# Patient Record
Sex: Female | Born: 1965
Health system: Southern US, Community
[De-identification: ages and names within clinical notes are randomized; demographics above are authoritative.]

## PROBLEM LIST (undated history)

## (undated) DIAGNOSIS — D72819 Decreased white blood cell count, unspecified: Secondary | ICD-10-CM

## (undated) DIAGNOSIS — B019 Varicella without complication: Secondary | ICD-10-CM

## (undated) DIAGNOSIS — N189 Chronic kidney disease, unspecified: Secondary | ICD-10-CM

## (undated) DIAGNOSIS — N200 Calculus of kidney: Secondary | ICD-10-CM

## (undated) DIAGNOSIS — G43909 Migraine, unspecified, not intractable, without status migrainosus: Secondary | ICD-10-CM

## (undated) DIAGNOSIS — Z114 Encounter for screening for human immunodeficiency virus [HIV]: Secondary | ICD-10-CM

## (undated) DIAGNOSIS — C801 Malignant (primary) neoplasm, unspecified: Secondary | ICD-10-CM

## (undated) DIAGNOSIS — I493 Ventricular premature depolarization: Principal | ICD-10-CM

## (undated) DIAGNOSIS — C8590 Non-Hodgkin lymphoma, unspecified, unspecified site: Secondary | ICD-10-CM

## (undated) HISTORY — DX: Encounter for screening for human immunodeficiency virus (HIV): Z11.4

## (undated) HISTORY — DX: Chronic kidney disease, unspecified: N18.9

## (undated) HISTORY — DX: Malignant (primary) neoplasm, unspecified: C80.1

## (undated) HISTORY — DX: Migraine, unspecified, not intractable, without status migrainosus: G43.909

## (undated) HISTORY — DX: Non-Hodgkin lymphoma, unspecified, unspecified site: C85.90

## (undated) HISTORY — DX: Calculus of kidney: N20.0

## (undated) HISTORY — DX: Decreased white blood cell count, unspecified: D72.819

## (undated) HISTORY — PX: EYE SURGERY: SHX253

## (undated) HISTORY — PX: COLONOSCOPY: SHX174

## (undated) HISTORY — DX: Varicella without complication: B01.9

## (undated) HISTORY — DX: Ventricular premature depolarization: I49.3

## (undated) HISTORY — PX: SPINE SURGERY: SHX786

## (undated) HISTORY — PX: WISDOM TOOTH EXTRACTION: SHX21

---

## 1999-01-31 HISTORY — PX: CHOLECYSTECTOMY: SHX55

## 1999-01-31 HISTORY — PX: LAPAROSCOPIC CHOLECYSTECTOMY: SUR755

## 2004-01-31 HISTORY — PX: REFRACTIVE SURGERY: SHX103

## 2004-06-30 ENCOUNTER — Other Ambulatory Visit: Admission: RE | Admit: 2004-06-30 | Discharge: 2004-06-30 | Payer: Self-pay | Admitting: Obstetrics and Gynecology

## 2004-07-14 ENCOUNTER — Ambulatory Visit (HOSPITAL_COMMUNITY): Admission: RE | Admit: 2004-07-14 | Discharge: 2004-07-14 | Payer: Self-pay | Admitting: *Deleted

## 2005-07-04 ENCOUNTER — Other Ambulatory Visit: Admission: RE | Admit: 2005-07-04 | Discharge: 2005-07-04 | Payer: Self-pay | Admitting: Obstetrics & Gynecology

## 2006-02-07 ENCOUNTER — Ambulatory Visit (HOSPITAL_COMMUNITY): Admission: RE | Admit: 2006-02-07 | Discharge: 2006-02-07 | Payer: Self-pay | Admitting: Obstetrics and Gynecology

## 2006-10-24 ENCOUNTER — Other Ambulatory Visit: Admission: RE | Admit: 2006-10-24 | Discharge: 2006-10-24 | Payer: Self-pay | Admitting: Obstetrics and Gynecology

## 2007-03-04 ENCOUNTER — Ambulatory Visit (HOSPITAL_COMMUNITY): Admission: RE | Admit: 2007-03-04 | Discharge: 2007-03-04 | Payer: Self-pay | Admitting: Obstetrics and Gynecology

## 2007-05-01 DIAGNOSIS — Z87442 Personal history of urinary calculi: Secondary | ICD-10-CM

## 2007-05-01 HISTORY — DX: Personal history of urinary calculi: Z87.442

## 2007-10-25 ENCOUNTER — Other Ambulatory Visit: Admission: RE | Admit: 2007-10-25 | Discharge: 2007-10-25 | Payer: Self-pay | Admitting: Obstetrics and Gynecology

## 2007-11-16 ENCOUNTER — Emergency Department (HOSPITAL_COMMUNITY): Admission: EM | Admit: 2007-11-16 | Discharge: 2007-11-16 | Payer: Self-pay | Admitting: Emergency Medicine

## 2008-03-31 ENCOUNTER — Encounter: Admission: RE | Admit: 2008-03-31 | Discharge: 2008-03-31 | Payer: Self-pay | Admitting: Obstetrics and Gynecology

## 2008-05-15 ENCOUNTER — Ambulatory Visit: Payer: Self-pay | Admitting: Diagnostic Radiology

## 2008-05-15 ENCOUNTER — Emergency Department (HOSPITAL_BASED_OUTPATIENT_CLINIC_OR_DEPARTMENT_OTHER): Admission: EM | Admit: 2008-05-15 | Discharge: 2008-05-15 | Payer: Self-pay | Admitting: Emergency Medicine

## 2008-09-11 ENCOUNTER — Encounter: Admission: RE | Admit: 2008-09-11 | Discharge: 2008-09-11 | Payer: Self-pay | Admitting: Family Medicine

## 2009-04-01 ENCOUNTER — Encounter: Admission: RE | Admit: 2009-04-01 | Discharge: 2009-04-01 | Payer: Self-pay | Admitting: Pulmonary Disease

## 2009-04-26 ENCOUNTER — Encounter: Admission: RE | Admit: 2009-04-26 | Discharge: 2009-04-26 | Payer: Self-pay | Admitting: Unknown Physician Specialty

## 2009-10-14 ENCOUNTER — Encounter: Admission: RE | Admit: 2009-10-14 | Discharge: 2009-10-14 | Payer: Self-pay | Admitting: Pulmonary Disease

## 2009-12-16 ENCOUNTER — Ambulatory Visit (HOSPITAL_COMMUNITY): Admission: RE | Admit: 2009-12-16 | Discharge: 2009-12-16 | Payer: Self-pay | Admitting: Urology

## 2010-04-12 LAB — PREGNANCY, URINE: Preg Test, Ur: NEGATIVE

## 2010-04-18 ENCOUNTER — Other Ambulatory Visit: Payer: Self-pay | Admitting: Obstetrics and Gynecology

## 2010-04-18 DIAGNOSIS — Z1231 Encounter for screening mammogram for malignant neoplasm of breast: Secondary | ICD-10-CM

## 2010-05-04 ENCOUNTER — Telehealth: Payer: Self-pay | Admitting: *Deleted

## 2010-05-11 LAB — BASIC METABOLIC PANEL
BUN: 23 mg/dL (ref 6–23)
CO2: 26 mEq/L (ref 19–32)
Calcium: 9 mg/dL (ref 8.4–10.5)
Chloride: 103 mEq/L (ref 96–112)
Creatinine, Ser: 1 mg/dL (ref 0.4–1.2)
GFR calc Af Amer: >60 mL/min (ref >60)
GFR calc non Af Amer: >60 mL/min (ref >60)
Glucose, Bld: 120 mg/dL — ABNORMAL HIGH (ref 70–99)
Potassium: 3.9 mEq/L (ref 3.5–5.1)
Sodium: 140 mEq/L (ref 135–145)

## 2010-05-11 LAB — URINALYSIS, ROUTINE W REFLEX MICROSCOPIC
Bilirubin Urine: NEGATIVE
Glucose, UA: NEGATIVE mg/dL
Ketones, ur: NEGATIVE mg/dL
Nitrite: NEGATIVE
Protein, ur: 100 mg/dL — AB
Specific Gravity, Urine: 1.018 (ref 1.005–1.030)
Urobilinogen, UA: 0.2 mg/dL (ref 0.0–1.0)
pH: 6 (ref 5.0–8.0)

## 2010-05-11 LAB — CBC
HCT: 43.1 % (ref 36.0–46.0)
Hemoglobin: 14.4 g/dL (ref 12.0–15.0)
MCHC: 33.5 g/dL (ref 30.0–36.0)
MCV: 88.9 fL (ref 78.0–100.0)
Platelets: 163 10*3/uL (ref 150–400)
RBC: 4.85 MIL/uL (ref 3.87–5.11)
RDW: 12.4 % (ref 11.5–15.5)
WBC: 9.4 10*3/uL (ref 4.0–10.5)

## 2010-05-11 LAB — URINE MICROSCOPIC-ADD ON

## 2010-05-11 LAB — DIFFERENTIAL
Basophils Absolute: 0.1 10*3/uL (ref 0.0–0.1)
Basophils Relative: 1 % (ref 0–1)
Eosinophils Absolute: 0 10*3/uL (ref 0.0–0.7)
Eosinophils Relative: 0 % (ref 0–5)
Lymphocytes Relative: 3 % — ABNORMAL LOW (ref 12–46)
Lymphs Abs: 0.3 10*3/uL — ABNORMAL LOW (ref 0.7–4.0)
Monocytes Absolute: 0.4 10*3/uL (ref 0.1–1.0)
Monocytes Relative: 4 % (ref 3–12)
Neutro Abs: 8.6 10*3/uL — ABNORMAL HIGH (ref 1.7–7.7)
Neutrophils Relative %: 92 % — ABNORMAL HIGH (ref 43–77)

## 2010-05-11 LAB — PREGNANCY, URINE: Preg Test, Ur: NEGATIVE

## 2010-05-17 ENCOUNTER — Ambulatory Visit
Admission: RE | Admit: 2010-05-17 | Discharge: 2010-05-17 | Disposition: A | Discharge status: Home / Self Care | Payer: Self-pay | Source: Ambulatory Visit | Attending: Obstetrics and Gynecology | Admitting: Obstetrics and Gynecology

## 2010-05-17 DIAGNOSIS — Z1231 Encounter for screening mammogram for malignant neoplasm of breast: Secondary | ICD-10-CM

## 2010-09-20 ENCOUNTER — Other Ambulatory Visit: Payer: Self-pay | Admitting: Pulmonary Disease

## 2010-09-20 DIAGNOSIS — R918 Other nonspecific abnormal finding of lung field: Secondary | ICD-10-CM

## 2010-09-22 ENCOUNTER — Ambulatory Visit
Admission: RE | Admit: 2010-09-22 | Discharge: 2010-09-22 | Disposition: A | Discharge status: Home / Self Care | Payer: BC Managed Care – PPO | Source: Ambulatory Visit | Attending: Pulmonary Disease | Admitting: Pulmonary Disease

## 2010-09-22 DIAGNOSIS — R918 Other nonspecific abnormal finding of lung field: Secondary | ICD-10-CM

## 2010-11-01 LAB — POCT I-STAT, CHEM 8
BUN: 18
Calcium, Ion: 1.15
Chloride: 103
Creatinine, Ser: 1
Glucose, Bld: 90
HCT: 42
Hemoglobin: 14.3
Potassium: 3.7
Sodium: 141
TCO2: 29

## 2010-11-01 LAB — DIFFERENTIAL
Basophils Absolute: 0
Basophils Relative: 1
Eosinophils Absolute: 0.1
Eosinophils Relative: 2
Lymphocytes Relative: 23
Lymphs Abs: 0.8
Monocytes Absolute: 0.3
Monocytes Relative: 9
Neutro Abs: 2.2
Neutrophils Relative %: 66

## 2010-11-01 LAB — CBC
HCT: 41.3
Hemoglobin: 13.9
MCHC: 33.6
MCV: 89.3
Platelets: 177
RBC: 4.62
RDW: 13.8
WBC: 3.4 — ABNORMAL LOW

## 2011-04-18 ENCOUNTER — Other Ambulatory Visit: Payer: Self-pay | Admitting: Obstetrics and Gynecology

## 2011-04-18 DIAGNOSIS — Z139 Encounter for screening, unspecified: Secondary | ICD-10-CM

## 2011-05-23 ENCOUNTER — Ambulatory Visit
Admission: RE | Admit: 2011-05-23 | Discharge: 2011-05-23 | Disposition: A | Discharge status: Home / Self Care | Payer: BC Managed Care – PPO | Source: Ambulatory Visit | Attending: Obstetrics and Gynecology | Admitting: Obstetrics and Gynecology

## 2011-05-23 DIAGNOSIS — Z139 Encounter for screening, unspecified: Secondary | ICD-10-CM

## 2012-05-07 ENCOUNTER — Other Ambulatory Visit: Payer: Self-pay | Admitting: Obstetrics and Gynecology

## 2012-05-07 DIAGNOSIS — Z1231 Encounter for screening mammogram for malignant neoplasm of breast: Secondary | ICD-10-CM

## 2012-05-28 ENCOUNTER — Ambulatory Visit (INDEPENDENT_AMBULATORY_CARE_PROVIDER_SITE_OTHER): Payer: BC Managed Care – PPO

## 2012-05-28 DIAGNOSIS — Z1231 Encounter for screening mammogram for malignant neoplasm of breast: Secondary | ICD-10-CM

## 2012-08-14 ENCOUNTER — Encounter: Payer: Self-pay | Admitting: Nurse Practitioner

## 2012-08-14 ENCOUNTER — Ambulatory Visit (INDEPENDENT_AMBULATORY_CARE_PROVIDER_SITE_OTHER): Payer: BC Managed Care – PPO | Admitting: Nurse Practitioner

## 2012-08-14 VITALS — BP 106/64 | HR 62 | Resp 14 | Ht 67.0 in | Wt 140.0 lb

## 2012-08-14 DIAGNOSIS — N760 Acute vaginitis: Secondary | ICD-10-CM

## 2012-08-14 MED ORDER — METRONIDAZOLE 0.75 % VA GEL: 1.0000 | Freq: Every day | VAGINAL | Status: DC

## 2012-08-14 NOTE — Patient Instructions (Addendum)
Bacterial Vaginosis Bacterial vaginosis (BV) is a vaginal infection where the normal balance of bacteria in the vagina is disrupted. The normal balance is then replaced by an overgrowth of certain bacteria. There are several different kinds of bacteria that can cause BV. BV is the most common vaginal infection in women of childbearing age. CAUSES   The cause of BV is not fully understood. BV develops when there is an increase or imbalance of harmful bacteria.  Some activities or behaviors can upset the normal balance of bacteria in the vagina and put women at increased risk including:  Having a new sex partner or multiple sex partners.  Douching.  Using an intrauterine device (IUD) for contraception.  It is not clear what role sexual activity plays in the development of BV. However, women that have never had sexual intercourse are rarely infected with BV. Women do not get BV from toilet seats, bedding, swimming pools or from touching objects around them.  SYMPTOMS   Grey vaginal discharge.  A fish-like odor with discharge, especially after sexual intercourse.  Itching or burning of the vagina and vulva.  Burning or pain with urination.  Some women have no signs or symptoms at all. DIAGNOSIS  Your caregiver must examine the vagina for signs of BV. Your caregiver will perform lab tests and look at the sample of vaginal fluid through a microscope. They will look for bacteria and abnormal cells (clue cells), a pH test higher than 4.5, and a positive amine test all associated with BV.  RISKS AND COMPLICATIONS   Pelvic inflammatory disease (PID).  Infections following gynecology surgery.  Developing HIV.  Developing herpes virus. TREATMENT  Sometimes BV will clear up without treatment. However, all women with symptoms of BV should be treated to avoid complications, especially if gynecology surgery is planned. Female partners generally do not need to be treated. However, BV may spread  between female sex partners so treatment is helpful in preventing a recurrence of BV.   BV may be treated with antibiotics. The antibiotics come in either pill or vaginal cream forms. Either can be used with nonpregnant or pregnant women, but the recommended dosages differ. These antibiotics are not harmful to the baby.  BV can recur after treatment. If this happens, a second round of antibiotics will often be prescribed.  Treatment is important for pregnant women. If not treated, BV can cause a premature delivery, especially for a pregnant woman who had a premature birth in the past. All pregnant women who have symptoms of BV should be checked and treated.  For chronic reoccurrence of BV, treatment with a type of prescribed gel vaginally twice a week is helpful. HOME CARE INSTRUCTIONS   Finish all medication as directed by your caregiver.  Do not have sex until treatment is completed.  Tell your sexual partner that you have a vaginal infection. They should see their caregiver and be treated if they have problems, such as a mild rash or itching.  Practice safe sex. Use condoms. Only have 1 sex partner. PREVENTION  Basic prevention steps can help reduce the risk of upsetting the natural balance of bacteria in the vagina and developing BV:  Do not have sexual intercourse (be abstinent).  Do not douche.  Use all of the medicine prescribed for treatment of BV, even if the signs and symptoms go away.  Tell your sex partner if you have BV. That way, they can be treated, if needed, to prevent reoccurrence. SEEK MEDICAL CARE IF:     Your symptoms are not improving after 3 days of treatment.  You have increased discharge, pain, or fever. MAKE SURE YOU:   Understand these instructions.  Will watch your condition.  Will get help right away if you are not doing well or get worse. FOR MORE INFORMATION  Division of STD Prevention (DSTDP), Centers for Disease Control and Prevention:  www.cdc.gov/std [employer] Association (ASHA): www.ashastd.org  Document Released: 01/16/2005 Document Revised: 04/10/2011 Document Reviewed: 07/09/2008 [employer] Patient Information 2014 [employer].  

## 2012-08-14 NOTE — Progress Notes (Signed)
Subjective:     Patient ID: Laura Ritter, female   DOB: 18-Nov-1965, 47 y.o.   MRN: [id number]  HPI46 yo MW Fe presents with a vaginal discharge of 2 days that is yellow with mild odor.  Some peri anal irritation. She has been to the beach and at the pool over 4 th of July.  She denies itching and burning. No urinary symptoms. No change in personal products.  She has not had menses since 5/27. No bloating but occasionally feels like period is about to start.   Review of Systems  Constitutional: Negative for fever, chills and fatigue.  Respiratory: Negative.   Cardiovascular: Negative.   Gastrointestinal: Negative.  Negative for nausea, vomiting, diarrhea and abdominal distention.  Genitourinary: Negative.  Negative for dysuria, urgency, frequency, hematuria, flank pain, pelvic pain and dyspareunia.       Amenorrhea since 5/27. Husband with vasectomy. She did have one other episode of amenorrhea last year.  Denies vaso symptoms.  Neurological: Negative.   Psychiatric/Behavioral: Negative.        Objective:   Physical Exam  Constitutional: She is oriented to person, place, and time. She appears well-developed and well-nourished.  Abdominal: Soft. She exhibits no distension and no mass. There is no tenderness. There is no rebound and no guarding.  Genitourinary:  Thin yellow vaginal discharge. No cervicitis or bleeding. Ph 5.5, KOH neg for yeast, NSS + clue cells.  Neurological: She is alert and oriented to person, place, and time.  Psychiatric: She has a normal mood and affect. Her behavior is normal. Judgment and thought content normal.       Assessment:     BV Amenorrhea since 5/27    Plan:     Metrogel vaginal cream HS x 5  Keep menses calendar - if still no menses in 9 weeks to call back. May need Provera challenge.

## 2012-08-16 NOTE — Progress Notes (Signed)
Encounter reviewed by Dr. Koby Hartfield Silva.  

## 2012-12-02 ENCOUNTER — Ambulatory Visit: Payer: Self-pay | Admitting: Nurse Practitioner

## 2012-12-05 ENCOUNTER — Other Ambulatory Visit: Payer: Self-pay

## 2012-12-12 ENCOUNTER — Encounter: Payer: Self-pay | Admitting: Nurse Practitioner

## 2012-12-12 ENCOUNTER — Ambulatory Visit (INDEPENDENT_AMBULATORY_CARE_PROVIDER_SITE_OTHER): Payer: BC Managed Care – PPO | Admitting: Nurse Practitioner

## 2012-12-12 VITALS — BP 100/60 | HR 60 | Resp 16 | Ht 67.0 in | Wt 140.0 lb

## 2012-12-12 DIAGNOSIS — Z01419 Encounter for gynecological examination (general) (routine) without abnormal findings: Secondary | ICD-10-CM

## 2012-12-12 DIAGNOSIS — Z0000 Encounter for general adult medical examination without abnormal findings: Secondary | ICD-10-CM

## 2012-12-12 LAB — POCT URINALYSIS DIPSTICK
Bilirubin, UA: NEGATIVE
Glucose, UA: NEGATIVE
Ketones, UA: NEGATIVE
Leukocytes, UA: NEGATIVE
Nitrite, UA: NEGATIVE
Protein, UA: NEGATIVE
Urobilinogen, UA: NEGATIVE
pH, UA: 6

## 2012-12-12 LAB — HEMOGLOBIN, FINGERSTICK: Hemoglobin, fingerstick: 13.8 g/dL (ref 12.0–16.0)

## 2012-12-12 NOTE — Progress Notes (Signed)
]Patient ID: Laura Ritter, female   DOB: 05-21-1965, 47 y.o.   MRN: [id number] 47 y.o. [id number] Married Caucasian Fe here for annual exam.  Menses have been irregular secondary to her exercise routine.  This past cycle in November, prior cycle in August, prior to that May.  Last 3 days.  Heavier 1-2 days then light. No cramps. Some PMS.  Still working at [employer] and also at [employer].  Just completed orientation.  Patient's last menstrual period was 12/10/2012.          Sexually active: yes  The current method of family planning is vasectomy.    Exercising: yes  Gym/ health club routine includes cardio and light weights. Smoker:  no  Health Maintenance: Pap: 11/27/11, WNL, neg HR HPV MMG: 05/29/12, Bi-Rads 1: negative TDaP: 2007 Labs: HB: 13.8 Urine: mod blood, pH 6.0 (on menses)   reports that she has never smoked. She has never used smokeless tobacco. She reports that she drinks about 2.5 ounces of alcohol per week. She reports that she does not use illicit drugs.  Past Medical History  Diagnosis Date  . Renal calculi 4/09    followed yearly follow up- Dr. Annabell Howells    Past Surgical History  Procedure Laterality Date  . Cesarean section      x3  . Laparoscopic cholecystectomy  2001  . Refractive surgery Bilateral 2006    lasik    Current Outpatient Prescriptions  Medication Sig Dispense Refill  . fluocinonide cream (LIDEX) 0.05 % Apply topically as needed.      . Multiple Vitamins-Minerals (MULTIVITAMIN PO) Take by mouth.       No current facility-administered medications for this visit.    Family History  Problem Relation Age of Onset  . Hyperlipidemia Mother   . Hypertension Father   . Parkinson's disease Father   . Kidney Stones Brother     ROS:  Pertinent items are noted in HPI.  Otherwise, a comprehensive ROS was negative.  Exam:   BP 100/60  Pulse 60  Resp 16  Ht 5\' 7"  (1.702 m)  Wt 140 lb (63.504 kg)  BMI 21.92 kg/m2  LMP 12/10/2012 Height: 5'  7" (170.2 cm)  Ht Readings from Last 3 Encounters:  12/12/12 5\' 7"  (1.702 m)  08/14/12 5\' 7"  (1.702 m)    General appearance: alert, cooperative and appears stated age Head: Normocephalic, without obvious abnormality, atraumatic Neck: no adenopathy, supple, symmetrical, trachea midline and thyroid normal to inspection and palpation Lungs: clear to auscultation bilaterally Breasts: normal appearance, no masses or tenderness Heart: regular rate and rhythm Abdomen: soft, non-tender; no masses,  no organomegaly Extremities: extremities normal, atraumatic, no cyanosis or edema Skin: Skin color, texture, turgor normal. No rashes or lesions Lymph nodes: Cervical, supraclavicular, and axillary nodes normal. No abnormal inguinal nodes palpated Neurologic: Grossly normal   Pelvic: External genitalia:  no lesions              Urethra:  normal appearing urethra with no masses, tenderness or lesions              Bartholin's and Skene's: normal                 Vagina: normal appearing vagina with normal color and discharge, no lesions              Cervix: anteverted              Pap taken: no Bimanual Exam:  Uterus:  normal size, contour, position, consistency, mobility, non-tender              Adnexa: no mass, fullness, tenderness               Rectovaginal: Confirms               Anus:  normal sphincter tone, no lesions  A:  Well Woman with normal exam  Husband with vasectomy  History of irregular menses  P:   Pap smear as per guidelines   mammogram due 4/15  Continue menses record  Counseled on breast self exam, adequate intake of calcium and vitamin D, diet and exercise return annually or prn  An After Visit Summary was printed and given to the patient.   Co

## 2012-12-12 NOTE — Patient Instructions (Signed)

## 2012-12-15 NOTE — Progress Notes (Signed)
Encounter reviewed by Dr. Brook Silva.  

## 2013-07-03 ENCOUNTER — Other Ambulatory Visit: Payer: Self-pay | Admitting: Obstetrics and Gynecology

## 2013-07-03 DIAGNOSIS — Z0000 Encounter for general adult medical examination without abnormal findings: Secondary | ICD-10-CM

## 2013-07-07 ENCOUNTER — Encounter: Payer: Self-pay | Admitting: Nurse Practitioner

## 2013-07-07 ENCOUNTER — Ambulatory Visit (INDEPENDENT_AMBULATORY_CARE_PROVIDER_SITE_OTHER): Payer: BC Managed Care – PPO | Admitting: Nurse Practitioner

## 2013-07-07 VITALS — BP 102/65 | HR 56 | Temp 98.4°F | Ht 67.0 in | Wt 136.5 lb

## 2013-07-07 DIAGNOSIS — Z87898 Personal history of other specified conditions: Secondary | ICD-10-CM

## 2013-07-07 DIAGNOSIS — N951 Menopausal and female climacteric states: Secondary | ICD-10-CM

## 2013-07-07 DIAGNOSIS — R918 Other nonspecific abnormal finding of lung field: Secondary | ICD-10-CM

## 2013-07-07 DIAGNOSIS — Z0000 Encounter for general adult medical examination without abnormal findings: Secondary | ICD-10-CM

## 2013-07-07 DIAGNOSIS — Z8709 Personal history of other diseases of the respiratory system: Secondary | ICD-10-CM

## 2013-07-07 DIAGNOSIS — R1013 Epigastric pain: Secondary | ICD-10-CM

## 2013-07-07 LAB — CBC WITH DIFFERENTIAL/PLATELET
Basophils Absolute: 0 10*3/uL (ref 0.0–0.1)
Basophils Relative: 0.4 % (ref 0.0–3.0)
Eosinophils Absolute: 0.1 10*3/uL (ref 0.0–0.7)
Eosinophils Relative: 1.8 % (ref 0.0–5.0)
HCT: 43.1 % (ref 36.0–46.0)
Hemoglobin: 14.6 g/dL (ref 12.0–15.0)
Lymphocytes Relative: 22.3 % (ref 12.0–46.0)
Lymphs Abs: 0.8 10*3/uL (ref 0.7–4.0)
MCHC: 34 g/dL (ref 30.0–36.0)
MCV: 85.8 fl (ref 78.0–100.0)
Monocytes Absolute: 0.2 10*3/uL (ref 0.1–1.0)
Monocytes Relative: 6.7 % (ref 3.0–12.0)
Neutro Abs: 2.6 10*3/uL (ref 1.4–7.7)
Neutrophils Relative %: 68.8 % (ref 43.0–77.0)
Platelets: 178 10*3/uL (ref 150.0–400.0)
RBC: 5.02 Mil/uL (ref 3.87–5.11)
RDW: 12.9 % (ref 11.5–15.5)
WBC: 3.8 10*3/uL — ABNORMAL LOW (ref 4.0–10.5)

## 2013-07-07 LAB — H. PYLORI ANTIBODY, IGG: H Pylori IgG: NEGATIVE

## 2013-07-07 MED ORDER — OMEPRAZOLE 20 MG PO CPDR: 20.0000 mg | DELAYED_RELEASE_CAPSULE | Freq: Every day | ORAL | Status: DC

## 2013-07-07 NOTE — Progress Notes (Signed)
Pre visit review using our clinic review tool, if applicable. No additional management support is needed unless otherwise documented below in the visit note. 

## 2013-07-07 NOTE — Patient Instructions (Signed)
Our office will call you with lab results and any necessary follow up. I think stomach pain is gastritis. Start omeprazole. Continue yogurt daily. Probiotics may be advantageous-we will discuss further at follow up. Follow up in 1 month.  Great to see you!  Gastritis, Adult Gastritis is soreness and swelling (inflammation) of the lining of the stomach. Gastritis can develop as a sudden onset (acute) or long-term (chronic) condition. If gastritis is not treated, it can lead to stomach bleeding and ulcers. CAUSES  Gastritis occurs when the stomach lining is weak or damaged. Digestive juices from the stomach then inflame the weakened stomach lining. The stomach lining may be weak or damaged due to viral or bacterial infections. One common bacterial infection is the Helicobacter pylori infection. Gastritis can also result from excessive alcohol consumption, taking certain medicines, or having too much acid in the stomach.  SYMPTOMS  In some cases, there are no symptoms. When symptoms are present, they may include:  Pain or a burning sensation in the upper abdomen.  Nausea.  Vomiting.  An uncomfortable feeling of fullness after eating. DIAGNOSIS  Your caregiver may suspect you have gastritis based on your symptoms and a physical exam. To determine the cause of your gastritis, your caregiver may perform the following:  Blood or stool tests to check for the H pylori bacterium.  Gastroscopy. A thin, flexible tube (endoscope) is passed down the esophagus and into the stomach. The endoscope has a light and camera on the end. Your caregiver uses the endoscope to view the inside of the stomach.  Taking a tissue sample (biopsy) from the stomach to examine under a microscope. TREATMENT  Depending on the cause of your gastritis, medicines may be prescribed. If you have a bacterial infection, such as an H pylori infection, antibiotics may be given. If your gastritis is caused by too much acid in the  stomach, H2 blockers or antacids may be given. Your caregiver may recommend that you stop taking aspirin, ibuprofen, or other nonsteroidal anti-inflammatory drugs (NSAIDs). HOME CARE INSTRUCTIONS  Only take over-the-counter or prescription medicines as directed by your caregiver.  If you were given antibiotic medicines, take them as directed. Finish them even if you start to feel better.  Drink enough fluids to keep your urine clear or pale yellow.  Avoid foods and drinks that make your symptoms worse, such as:  Caffeine or alcoholic drinks.  Chocolate.  Peppermint or mint flavorings.  Garlic and onions.  Spicy foods.  Citrus fruits, such as oranges, lemons, or limes.  Tomato-based foods such as sauce, chili, salsa, and pizza.  Fried and fatty foods.  Eat small, frequent meals instead of large meals. SEEK IMMEDIATE MEDICAL CARE IF:   You have black or dark red stools.  You vomit blood or material that looks like coffee grounds.  You are unable to keep fluids down.  Your abdominal pain gets worse.  You have a fever.  You do not feel better after 1 week.  You have any other questions or concerns. MAKE SURE YOU:  Understand these instructions.  Will watch your condition.  Will get help right away if you are not doing well or get worse. Document Released: 01/10/2001 Document Revised: 07/18/2011 Document Reviewed: 03/01/2011 [facility] Patient Information 2014 [place].  Preventive Care for Adults, Female A healthy lifestyle and preventive care can promote health and wellness. Preventive health guidelines for women include the following key practices.  A routine yearly physical is a good way to check with  your caregiver about your health and preventive screening. It is a chance to share any concerns and updates on your health, and to receive a thorough exam.  Visit your dentist for a routine exam and preventive care every 6 months. Brush your teeth twice a  day and floss once a day. Good oral hygiene prevents tooth decay and gum disease.  The frequency of eye exams is based on your age, health, family medical history, use of contact lenses, and other factors. Follow your caregiver's recommendations for frequency of eye exams.  Eat a healthy diet. Foods like vegetables, fruits, whole grains, low-fat dairy products, and lean protein foods contain the nutrients you need without too many calories. Decrease your intake of foods high in solid fats, added sugars, and salt. Eat the right amount of calories for you.Get information about a proper diet from your caregiver, if necessary.  Regular physical exercise is one of the most important things you can do for your health. Most adults should get at least 150 minutes of moderate-intensity exercise (any activity that increases your heart rate and causes you to sweat) each week. In addition, most adults need muscle-strengthening exercises on 2 or more days a week.  Maintain a healthy weight. The body mass index (BMI) is a screening tool to identify possible weight problems. It provides an estimate of body fat based on height and weight. Your caregiver can help determine your BMI, and can help you achieve or maintain a healthy weight.For adults 20 years and older:  A BMI below 18.5 is considered underweight.  A BMI of 18.5 to 24.9 is normal.  A BMI of 25 to 29.9 is considered overweight.  A BMI of 30 and above is considered obese.  Maintain normal blood lipids and cholesterol levels by exercising and minimizing your intake of saturated fat. Eat a balanced diet with plenty of fruit and vegetables. Blood tests for lipids and cholesterol should begin at age 29 and be repeated every 5 years. If your lipid or cholesterol levels are high, you are over 50, or you are at high risk for heart disease, you may need your cholesterol levels checked more frequently.Ongoing high lipid and cholesterol levels should be treated  with medicines if diet and exercise are not effective.  If you smoke, find out from your caregiver how to quit. If you do not use tobacco, do not start.  Lung cancer screening is recommended for adults aged 70 80 years who are at high risk for developing lung cancer because of a history of smoking. Yearly low-dose computed tomography (CT) is recommended for people who have at least a 30-pack-year history of smoking and are a current smoker or have quit within the past 15 years. A pack year of smoking is smoking an average of 1 pack of cigarettes a day for 1 year (for example: 1 pack a day for 30 years or 2 packs a day for 15 years). Yearly screening should continue until the smoker has stopped smoking for at least 15 years. Yearly screening should also be stopped for people who develop a health problem that would prevent them from having lung cancer treatment.  If you are pregnant, do not drink alcohol. If you are breastfeeding, be very cautious about drinking alcohol. If you are not pregnant and choose to drink alcohol, do not exceed 1 drink per day. One drink is considered to be 12 ounces (355 mL) of beer, 5 ounces (148 mL) of wine, or 1.5 ounces (44 mL)  of liquor.  Avoid use of street drugs. Do not share needles with anyone. Ask for help if you need support or instructions about stopping the use of drugs.  High blood pressure causes heart disease and increases the risk of stroke. Your blood pressure should be checked at least every 1 to 2 years. Ongoing high blood pressure should be treated with medicines if weight loss and exercise are not effective.  If you are 43 to 48 years old, ask your caregiver if you should take aspirin to prevent strokes.  Diabetes screening involves taking a blood sample to check your fasting blood sugar level. This should be done once every 3 years, after age 59, if you are within normal weight and without risk factors for diabetes. Testing should be considered at a  younger age or be carried out more frequently if you are overweight and have at least 1 risk factor for diabetes.  Breast cancer screening is essential preventive care for women. You should practice "breast self-awareness." This means understanding the normal appearance and feel of your breasts and may include breast self-examination. Any changes detected, no matter how small, should be reported to a caregiver. Women in their 81s and 30s should have a clinical breast exam (CBE) by a caregiver as part of a regular health exam every 1 to 3 years. After age 5, women should have a CBE every year. Starting at age 30, women should consider having a mammography (breast X-ray test) every year. Women who have a family history of breast cancer should talk to their caregiver about genetic screening. Women at a high risk of breast cancer should talk to their caregivers about having magnetic resonance imaging (MRI) and a mammography every year.  Breast cancer gene (BRCA)-related cancer risk assessment is recommended for women who have family members with BRCA-related cancers. BRCA-related cancers include breast, ovarian, tubal, and peritoneal cancers. Having family members with these cancers may be associated with an increased risk for harmful changes (mutations) in the breast cancer genes BRCA1 and BRCA2. Results of the assessment will determine the need for genetic counseling and BRCA1 and BRCA2 testing.  The Pap test is a screening test for cervical cancer. A Pap test can show cell changes on the cervix that might become cervical cancer if left untreated. A Pap test is a procedure in which cells are obtained and examined from the lower end of the uterus (cervix).  Women should have a Pap test starting at age 87.  Between ages 54 and 70, Pap tests should be repeated every 2 years.  Beginning at age 81, you should have a Pap test every 3 years as long as the past 3 Pap tests have been normal.  Some women have  medical problems that increase the chance of getting cervical cancer. Talk to your caregiver about these problems. It is especially important to talk to your caregiver if a new problem develops soon after your last Pap test. In these cases, your caregiver may recommend more frequent screening and Pap tests.  The above recommendations are the same for women who have or have not gotten the vaccine for human papillomavirus (HPV).  If you had a hysterectomy for a problem that was not cancer or a condition that could lead to cancer, then you no longer need Pap tests. Even if you no longer need a Pap test, a regular exam is a good idea to make sure no other problems are starting.  If you are between ages 35  and 70, and you have had normal Pap tests going back 10 years, you no longer need Pap tests. Even if you no longer need a Pap test, a regular exam is a good idea to make sure no other problems are starting.  If you have had past treatment for cervical cancer or a condition that could lead to cancer, you need Pap tests and screening for cancer for at least 20 years after your treatment.  If Pap tests have been discontinued, risk factors (such as a new sexual partner) need to be reassessed to determine if screening should be resumed.  The HPV test is an additional test that may be used for cervical cancer screening. The HPV test looks for the virus that can cause the cell changes on the cervix. The cells collected during the Pap test can be tested for HPV. The HPV test could be used to screen women aged 55 years and older, and should be used in women of any age who have unclear Pap test results. After the age of 65, women should have HPV testing at the same frequency as a Pap test.  Colorectal cancer can be detected and often prevented. Most routine colorectal cancer screening begins at the age of 62 and continues through age 11. However, your caregiver may recommend screening at an earlier age if you have  risk factors for colon cancer. On a yearly basis, your caregiver may provide home test kits to check for hidden blood in the stool. Use of a small camera at the end of a tube, to directly examine the colon (sigmoidoscopy or colonoscopy), can detect the earliest forms of colorectal cancer. Talk to your caregiver about this at age 31, when routine screening begins. Direct examination of the colon should be repeated every 5 to 10 years through age 35, unless early forms of pre-cancerous polyps or small growths are found.  Hepatitis C blood testing is recommended for all people born from [birth year] through 1965 and any individual with known risks for hepatitis C.  Practice safe sex. Use condoms and avoid high-risk sexual practices to reduce the spread of sexually transmitted infections (STIs). STIs include gonorrhea, chlamydia, syphilis, trichomonas, herpes, HPV, and human immunodeficiency virus (HIV). Herpes, HIV, and HPV are viral illnesses that have no cure. They can result in disability, cancer, and death. Sexually active women aged 44 and younger should be checked for chlamydia. Older women with new or multiple partners should also be tested for chlamydia. Testing for other STIs is recommended if you are sexually active and at increased risk.  Osteoporosis is a disease in which the bones lose minerals and strength with aging. This can result in serious bone fractures. The risk of osteoporosis can be identified using a bone density scan. Women ages 96 and over and women at risk for fractures or osteoporosis should discuss screening with their caregivers. Ask your caregiver whether you should take a calcium supplement or vitamin D to reduce the rate of osteoporosis.  Menopause can be associated with physical symptoms and risks. Hormone replacement therapy is available to decrease symptoms and risks. You should talk to your caregiver about whether hormone replacement therapy is right for you.  Use sunscreen.  Apply sunscreen liberally and repeatedly throughout the day. You should seek shade when your shadow is shorter than you. Protect yourself by wearing long sleeves, pants, a wide-brimmed hat, and sunglasses year round, whenever you are outdoors.  Once a month, do a whole body skin exam, using a  mirror to look at the skin on your back. Notify your caregiver of new moles, moles that have irregular borders, moles that are larger than a pencil eraser, or moles that have changed in shape or color.  Stay current with required immunizations.  Influenza vaccine. All adults should be immunized every year.  Tetanus, diphtheria, and acellular pertussis (Td, Tdap) vaccine. Pregnant women should receive 1 dose of Tdap vaccine during each pregnancy. The dose should be obtained regardless of the length of time since the last dose. Immunization is preferred during the 27th to 36th week of gestation. An adult who has not previously received Tdap or who does not know her vaccine status should receive 1 dose of Tdap. This initial dose should be followed by tetanus and diphtheria toxoids (Td) booster doses every 10 years. Adults with an unknown or incomplete history of completing a 3-dose immunization series with Td-containing vaccines should begin or complete a primary immunization series including a Tdap dose. Adults should receive a Td booster every 10 years.  Varicella vaccine. An adult without evidence of immunity to varicella should receive 2 doses or a second dose if she has previously received 1 dose. Pregnant females who do not have evidence of immunity should receive the first dose after pregnancy. This first dose should be obtained before leaving the health care facility. The second dose should be obtained 4 8 weeks after the first dose.  Human papillomavirus (HPV) vaccine. Females aged 45 26 years who have not received the vaccine previously should obtain the 3-dose series. The vaccine is not recommended for use  in pregnant females. However, pregnancy testing is not needed before receiving a dose. If a female is found to be pregnant after receiving a dose, no treatment is needed. In that case, the remaining doses should be delayed until after the pregnancy. Immunization is recommended for any person with an immunocompromised condition through the age of 6 years if she did not get any or all doses earlier. During the 3-dose series, the second dose should be obtained 4 8 weeks after the first dose. The third dose should be obtained 24 weeks after the first dose and 16 weeks after the second dose.  Zoster vaccine. One dose is recommended for adults aged 79 years or older unless certain conditions are present.  Measles, mumps, and rubella (MMR) vaccine. Adults born before 3 generally are considered immune to measles and mumps. Adults born in [birth year] or later should have 1 or more doses of MMR vaccine unless there is a contraindication to the vaccine or there is laboratory evidence of immunity to each of the three diseases. A routine second dose of MMR vaccine should be obtained at least 28 days after the first dose for students attending postsecondary schools, health care workers, or international travelers. People who received inactivated measles vaccine or an unknown type of measles vaccine during 1963 1967 should receive 2 doses of MMR vaccine. People who received inactivated mumps vaccine or an unknown type of mumps vaccine before 1979 and are at high risk for mumps infection should consider immunization with 2 doses of MMR vaccine. For females of childbearing age, rubella immunity should be determined. If there is no evidence of immunity, females who are not pregnant should be vaccinated. If there is no evidence of immunity, females who are pregnant should delay immunization until after pregnancy. Unvaccinated health care workers born before 1957 who lack laboratory evidence of measles, mumps, or rubella immunity or  laboratory confirmation of disease  should consider measles and mumps immunization with 2 doses of MMR vaccine or rubella immunization with 1 dose of MMR vaccine.  Pneumococcal 13-valent conjugate (PCV13) vaccine. When indicated, a person who is uncertain of her immunization history and has no record of immunization should receive the PCV13 vaccine. An adult aged 68 years or older who has certain medical conditions and has not been previously immunized should receive 1 dose of PCV13 vaccine. This PCV13 should be followed with a dose of pneumococcal polysaccharide (PPSV23) vaccine. The PPSV23 vaccine dose should be obtained at least 8 weeks after the dose of PCV13 vaccine. An adult aged 28 years or older who has certain medical conditions and previously received 1 or more doses of PPSV23 vaccine should receive 1 dose of PCV13. The PCV13 vaccine dose should be obtained 1 or more years after the last PPSV23 vaccine dose.  Pneumococcal polysaccharide (PPSV23) vaccine. When PCV13 is also indicated, PCV13 should be obtained first. All adults aged 38 years and older should be immunized. An adult younger than age 72 years who has certain medical conditions should be immunized. Any person who resides in a nursing home or long-term care facility should be immunized. An adult smoker should be immunized. People with an immunocompromised condition and certain other conditions should receive both PCV13 and PPSV23 vaccines. People with human immunodeficiency virus (HIV) infection should be immunized as soon as possible after diagnosis. Immunization during chemotherapy or radiation therapy should be avoided. Routine use of PPSV23 vaccine is not recommended for American Indians, [place] Natives, or people younger than 65 years unless there are medical conditions that require PPSV23 vaccine. When indicated, people who have unknown immunization and have no record of immunization should receive PPSV23 vaccine. One-time revaccination  5 years after the first dose of PPSV23 is recommended for people aged 1 64 years who have chronic kidney failure, nephrotic syndrome, asplenia, or immunocompromised conditions. People who received 1 2 doses of PPSV23 before age 46 years should receive another dose of PPSV23 vaccine at age 72 years or later if at least 5 years have passed since the previous dose. Doses of PPSV23 are not needed for people immunized with PPSV23 at or after age 32 years.  Meningococcal vaccine. Adults with asplenia or persistent complement component deficiencies should receive 2 doses of quadrivalent meningococcal conjugate (MenACWY-D) vaccine. The doses should be obtained at least 2 months apart. Microbiologists working with certain meningococcal bacteria, [place] recruits, people at risk during an outbreak, and people who travel to or live in countries with a high rate of meningitis should be immunized. A first-year college student up through age 77 years who is living in a residence hall should receive a dose if she did not receive a dose on or after her 16th birthday. Adults who have certain high-risk conditions should receive one or more doses of vaccine.  Hepatitis A vaccine. Adults who wish to be protected from this disease, have certain high-risk conditions, work with hepatitis A-infected animals, work in hepatitis A research labs, or travel to or work in countries with a high rate of hepatitis A should be immunized. Adults who were previously unvaccinated and who anticipate close contact with an international adoptee during the first 60 days after arrival in the [place] from a country with a high rate of hepatitis A should be immunized.  Hepatitis B vaccine. Adults who wish to be protected from this disease, have certain high-risk conditions, may be exposed to blood or other infectious body fluids, are  household contacts or sex partners of hepatitis B positive people, are clients or workers in certain care  facilities, or travel to or work in countries with a high rate of hepatitis B should be immunized.  Haemophilus influenzae type b (Hib) vaccine. A previously unvaccinated person with asplenia or sickle cell disease or having a scheduled splenectomy should receive 1 dose of Hib vaccine. Regardless of previous immunization, a recipient of a hematopoietic stem cell transplant should receive a 3-dose series 6 12 months after her successful transplant. Hib vaccine is not recommended for adults with HIV infection. Preventive Services / Frequency Ages 71 to 48  Blood pressure check.** / Every 1 to 2 years.  Lipid and cholesterol check.** / Every 5 years beginning at age 46.  Clinical breast exam.** / Every 3 years for women in their 1s and 60s.  BRCA-related cancer risk assessment.** / For women who have family members with a BRCA-related cancer (breast, ovarian, tubal, or peritoneal cancers).  Pap test.** / Every 2 years from ages 44 through 67. Every 3 years starting at age 19 through age 34 or 51 with a history of 3 consecutive normal Pap tests.  HPV screening.** / Every 3 years from ages 38 through ages 33 to 101 with a history of 3 consecutive normal Pap tests.  Hepatitis C blood test.** / For any individual with known risks for hepatitis C.  Skin self-exam. / Monthly.  Influenza vaccine. / Every year.  Tetanus, diphtheria, and acellular pertussis (Tdap, Td) vaccine.** / Consult your caregiver. Pregnant women should receive 1 dose of Tdap vaccine during each pregnancy. 1 dose of Td every 10 years.  Varicella vaccine.** / Consult your caregiver. Pregnant females who do not have evidence of immunity should receive the first dose after pregnancy.  HPV vaccine. / 3 doses over 6 months, if 30 and younger. The vaccine is not recommended for use in pregnant females. However, pregnancy testing is not needed before receiving a dose.  Measles, mumps, rubella (MMR) vaccine.** / You need at least 1  dose of MMR if you were born in 1957 or later. You may also need a 2nd dose. For females of childbearing age, rubella immunity should be determined. If there is no evidence of immunity, females who are not pregnant should be vaccinated. If there is no evidence of immunity, females who are pregnant should delay immunization until after pregnancy.  Pneumococcal 13-valent conjugate (PCV13) vaccine.** / Consult your caregiver.  Pneumococcal polysaccharide (PPSV23) vaccine.** / 1 to 2 doses if you smoke cigarettes or if you have certain conditions.  Meningococcal vaccine.** / 1 dose if you are age 69 to 22 years and a Market researcher living in a residence hall, or have one of several medical conditions, you need to get vaccinated against meningococcal disease. You may also need additional booster doses.  Hepatitis A vaccine.** / Consult your caregiver.  Hepatitis B vaccine.** / Consult your caregiver.  Haemophilus influenzae type b (Hib) vaccine.** / Consult your caregiver. Ages 63 to 33  Blood pressure check.** / Every 1 to 2 years.  Lipid and cholesterol check.** / Every 5 years beginning at age 19.  Lung cancer screening. / Every year if you are aged 22 80 years and have a 30-pack-year history of smoking and currently smoke or have quit within the past 15 years. Yearly screening is stopped once you have quit smoking for at least 15 years or develop a health problem that would prevent you from having lung cancer  treatment.  Clinical breast exam.** / Every year after age 67.  BRCA-related cancer risk assessment.** / For women who have family members with a BRCA-related cancer (breast, ovarian, tubal, or peritoneal cancers).  Mammogram.** / Every year beginning at age 30 and continuing for as long as you are in good health. Consult with your caregiver.  Pap test.** / Every 3 years starting at age 52 through age 4 or 24 with a history of 3 consecutive normal Pap tests.  HPV  screening.** / Every 3 years from ages 82 through ages 53 to 69 with a history of 3 consecutive normal Pap tests.  Fecal occult blood test (FOBT) of stool. / Every year beginning at age 72 and continuing until age 18. You may not need to do this test if you get a colonoscopy every 10 years.  Flexible sigmoidoscopy or colonoscopy.** / Every 5 years for a flexible sigmoidoscopy or every 10 years for a colonoscopy beginning at age 70 and continuing until age 79.  Hepatitis C blood test.** / For all people born from [birth year] through 1965 and any individual with known risks for hepatitis C.  Skin self-exam. / Monthly.  Influenza vaccine. / Every year.  Tetanus, diphtheria, and acellular pertussis (Tdap/Td) vaccine.** / Consult your caregiver. Pregnant women should receive 1 dose of Tdap vaccine during each pregnancy. 1 dose of Td every 10 years.  Varicella vaccine.** / Consult your caregiver. Pregnant females who do not have evidence of immunity should receive the first dose after pregnancy.  Zoster vaccine.** / 1 dose for adults aged 71 years or older.  Measles, mumps, rubella (MMR) vaccine.** / You need at least 1 dose of MMR if you were born in 1957 or later. You may also need a 2nd dose. For females of childbearing age, rubella immunity should be determined. If there is no evidence of immunity, females who are not pregnant should be vaccinated. If there is no evidence of immunity, females who are pregnant should delay immunization until after pregnancy.  Pneumococcal 13-valent conjugate (PCV13) vaccine.** / Consult your caregiver.  Pneumococcal polysaccharide (PPSV23) vaccine.** / 1 to 2 doses if you smoke cigarettes or if you have certain conditions.  Meningococcal vaccine.** / Consult your caregiver.  Hepatitis A vaccine.** / Consult your caregiver.  Hepatitis B vaccine.** / Consult your caregiver.  Haemophilus influenzae type b (Hib) vaccine.** / Consult your caregiver. Ages 47 and  over  Blood pressure check.** / Every 1 to 2 years.  Lipid and cholesterol check.** / Every 5 years beginning at age 44.  Lung cancer screening. / Every year if you are aged 29 80 years and have a 30-pack-year history of smoking and currently smoke or have quit within the past 15 years. Yearly screening is stopped once you have quit smoking for at least 15 years or develop a health problem that would prevent you from having lung cancer treatment.  Clinical breast exam.** / Every year after age 68.  BRCA-related cancer risk assessment.** / For women who have family members with a BRCA-related cancer (breast, ovarian, tubal, or peritoneal cancers).  Mammogram.** / Every year beginning at age 30 and continuing for as long as you are in good health. Consult with your caregiver.  Pap test.** / Every 3 years starting at age 30 through age 44 or 27 with a 3 consecutive normal Pap tests. Testing can be stopped between 65 and 70 with 3 consecutive normal Pap tests and no abnormal Pap or HPV tests in the past  10 years.  HPV screening.** / Every 3 years from ages 11 through ages 30 or 79 with a history of 3 consecutive normal Pap tests. Testing can be stopped between 65 and 70 with 3 consecutive normal Pap tests and no abnormal Pap or HPV tests in the past 10 years.  Fecal occult blood test (FOBT) of stool. / Every year beginning at age 41 and continuing until age 33. You may not need to do this test if you get a colonoscopy every 10 years.  Flexible sigmoidoscopy or colonoscopy.** / Every 5 years for a flexible sigmoidoscopy or every 10 years for a colonoscopy beginning at age 45 and continuing until age 93.  Hepatitis C blood test.** / For all people born from [birth year] through 1965 and any individual with known risks for hepatitis C.  Osteoporosis screening.** / A one-time screening for women ages 1 and over and women at risk for fractures or osteoporosis.  Skin self-exam. / Monthly.  Influenza  vaccine. / Every year.  Tetanus, diphtheria, and acellular pertussis (Tdap/Td) vaccine.** / 1 dose of Td every 10 years.  Varicella vaccine.** / Consult your caregiver.  Zoster vaccine.** / 1 dose for adults aged 27 years or older.  Pneumococcal 13-valent conjugate (PCV13) vaccine.** / Consult your caregiver.  Pneumococcal polysaccharide (PPSV23) vaccine.** / 1 dose for all adults aged 67 years and older.  Meningococcal vaccine.** / Consult your caregiver.  Hepatitis A vaccine.** / Consult your caregiver.  Hepatitis B vaccine.** / Consult your caregiver.  Haemophilus influenzae type b (Hib) vaccine.** / Consult your caregiver. ** Family history and personal history of risk and conditions may change your caregiver's recommendations. Document Released: 03/14/2001 Document Revised: 05/13/2012 Document Reviewed: 06/13/2010 [facility] Patient Information 2014 [place], [place].

## 2013-07-08 ENCOUNTER — Ambulatory Visit (INDEPENDENT_AMBULATORY_CARE_PROVIDER_SITE_OTHER): Payer: BC Managed Care – PPO

## 2013-07-08 DIAGNOSIS — Z1231 Encounter for screening mammogram for malignant neoplasm of breast: Secondary | ICD-10-CM

## 2013-07-08 DIAGNOSIS — Z0000 Encounter for general adult medical examination without abnormal findings: Secondary | ICD-10-CM

## 2013-07-08 LAB — COMPREHENSIVE METABOLIC PANEL
ALT: 20 U/L (ref 0–35)
AST: 22 U/L (ref 0–37)
Albumin: 4.2 g/dL (ref 3.5–5.2)
Alkaline Phosphatase: 39 U/L (ref 39–117)
BUN: 13 mg/dL (ref 6–23)
CO2: 32 mEq/L (ref 19–32)
Calcium: 10.1 mg/dL (ref 8.4–10.5)
Chloride: 101 mEq/L (ref 96–112)
Creatinine, Ser: 0.8 mg/dL (ref 0.4–1.2)
GFR: 82.73 mL/min (ref >60.00)
Glucose, Bld: 75 mg/dL (ref 70–99)
Potassium: 4.5 mEq/L (ref 3.5–5.1)
Sodium: 138 mEq/L (ref 135–145)
Total Bilirubin: 0.6 mg/dL (ref 0.2–1.2)
Total Protein: 6.7 g/dL (ref 6.0–8.3)

## 2013-07-08 LAB — ANGIOTENSIN CONVERTING ENZYME: Angiotensin-Converting Enzyme: 52 U/L (ref 8–52)

## 2013-07-08 LAB — URINALYSIS, MICROSCOPIC ONLY

## 2013-07-08 LAB — LIPID PANEL
Cholesterol: 191 mg/dL (ref 0–200)
HDL: 76.2 mg/dL (ref >39.00)
LDL Cholesterol: 98 mg/dL (ref 0–99)
NonHDL: 114.8
Total CHOL/HDL Ratio: 3
Triglycerides: 85 mg/dL (ref 0.0–149.0)
VLDL: 17 mg/dL (ref 0.0–40.0)

## 2013-07-08 LAB — VITAMIN D 25 HYDROXY (VIT D DEFICIENCY, FRACTURES): VITD: 40.89 ng/mL

## 2013-07-08 LAB — TSH: TSH: 1.01 u[IU]/mL (ref 0.35–4.50)

## 2013-07-09 ENCOUNTER — Encounter: Payer: Self-pay | Admitting: Nurse Practitioner

## 2013-07-09 ENCOUNTER — Telehealth: Payer: Self-pay | Admitting: Nurse Practitioner

## 2013-07-09 DIAGNOSIS — R1013 Epigastric pain: Secondary | ICD-10-CM | POA: Insufficient documentation

## 2013-07-09 DIAGNOSIS — Z87442 Personal history of urinary calculi: Secondary | ICD-10-CM | POA: Insufficient documentation

## 2013-07-09 DIAGNOSIS — N951 Menopausal and female climacteric states: Secondary | ICD-10-CM | POA: Insufficient documentation

## 2013-07-09 DIAGNOSIS — R918 Other nonspecific abnormal finding of lung field: Secondary | ICD-10-CM | POA: Insufficient documentation

## 2013-07-09 DIAGNOSIS — Z0000 Encounter for general adult medical examination without abnormal findings: Secondary | ICD-10-CM | POA: Insufficient documentation

## 2013-07-09 DIAGNOSIS — Z1211 Encounter for screening for malignant neoplasm of colon: Secondary | ICD-10-CM | POA: Insufficient documentation

## 2013-07-09 HISTORY — DX: Menopausal and female climacteric states: N95.1

## 2013-07-09 NOTE — Telephone Encounter (Signed)
WBC slightly low, stable. Will check again in 1 mo. Thyroid nml CMET nml Lipids great Urine nml Vit D good-should take maintenance dose 500 iu d# qd or 1000 iu qod. ACE high nml. Hx "ground-glass" nodules on CT. Had several scans, stable-no change. Cannot r/u sarcoidosis. She has Hx dry eyes. No Hx of polyarthralgia. Will review historical records when received to determine if further testing/referrals are indicated.  Discussed all w/pt. F/u in 4 weeks.

## 2013-07-09 NOTE — Progress Notes (Signed)
Subjective:     Laura Ritter is a 48 y.o. female who presents for evaluation of epigastric abdominal pain. Onset was a few months ago. Symptoms have been gradually worsening-pain has been nearly constant for last several days. The pain is described as burning, and is 4/10 in intensity. Pain is located in the epigastric region with radiation to back.  Aggravating factors: citrus.  Alleviating factors: antacids. Associated symptoms: none. The patient denies constipation, diarrhea and nausea. She recounts some recent family stress with death of mother few mos ago. Historical care at [facility]. Records will be requested & reviewed. Will perform CPE today. [facility] performed by gynecology. Discussed abnormal chest CT scans. Reviewed reports.  The patient's history has been marked as reviewed and updated as appropriate.  Review of Systems Pertinent items are noted in HPI.     Objective:    BP 102/65  Pulse 56  Temp(Src) 98.4 F (36.9 C) (Temporal)  Ht 5\' 7"  (1.702 m)  Wt 136 lb 8 oz (61.916 kg)  BMI 21.37 kg/m2  SpO2 99%  LMP 04/06/2013 General appearance: alert, cooperative, appears stated age and no distress Head: Normocephalic, without obvious abnormality, atraumatic Eyes: negative findings: lids and lashes normal and conjunctivae and sclerae normal Ears: normal TM's and external ear canals both ears Throat: lips, mucosa, and tongue normal; teeth and gums normal Lungs: clear to auscultation bilaterally Heart: regular rate and rhythm, S1, S2 normal, no murmur, click, rub or gallop Abdomen: normal findings: no bruits heard, no masses palpable, no organomegaly, spleen non-palpable, symmetric and umbilicus normal and abnormal findings:  epigastric tenderness just under xiphoid process Extremities: extremities normal, atraumatic, no cyanosis or edema Pulses: 2+ and symmetric Lymph nodes: Cervical, supraclavicular, and axillary nodes normal. Neurologic: Grossly normal    Assessment:   1. Preventative health care - CBC with Differential - Comprehensive metabolic panel - Lipid panel - Vit D  25 hydroxy (rtn osteoporosis monitoring) - Urine Microscopic - TSH  2. Abdominal pain, epigastric - CBC with Differential - Comprehensive metabolic panel - H. pylori antibody, IgG - omeprazole (PRILOSEC) 20 MG capsule; Take 1 capsule (20 mg total) by mouth daily.  Dispense: 30 capsule; Refill: 1  3. History of multiple pulmonary nodules Stable per chest ct scans - Angiotensin converting enzyme - Vit D  25 hydroxy (rtn osteoporosis monitoring)  4. Perimenopausal

## 2013-07-09 NOTE — Assessment & Plan Note (Signed)
Vaccines UTD [facility] managed by gynecology: [facility] apt tomorrow, last pap 3/14 normal. Screen labs today.

## 2013-07-09 NOTE — Assessment & Plan Note (Signed)
Radiating to back, epigastric palpation reproduces pain DD: gastritis, PUD,  Start omeprazole 20 mg. Increase to 40 if still having symptoms after 1 week. Labs: H pylori, cbc, cmet

## 2013-07-09 NOTE — Assessment & Plan Note (Signed)
Last [facility] 3/'14. Mild hot flushes. Discussed various treatments. Does not feel she needs anything.

## 2013-07-09 NOTE — Assessment & Plan Note (Signed)
DD: sarcoidosis, histoplasmosis, TB, [place] Asymptomatic ACE level Review historical records.

## 2013-07-10 ENCOUNTER — Encounter: Payer: Self-pay | Admitting: Nurse Practitioner

## 2013-07-11 ENCOUNTER — Telehealth: Payer: Self-pay | Admitting: Nurse Practitioner

## 2013-07-11 DIAGNOSIS — R1013 Epigastric pain: Secondary | ICD-10-CM

## 2013-07-11 MED ORDER — OMEPRAZOLE 40 MG PO CPDR: 40.0000 mg | DELAYED_RELEASE_CAPSULE | Freq: Every day | ORAL | Status: DC

## 2013-07-11 NOTE — Telephone Encounter (Signed)
Epigastric pain better but still present. Increase omeprazole to 40 mg qd until follow up. No further w/u regarding chest CT unless symptoms. Discussed w/pt. Answered all questions.

## 2013-08-06 ENCOUNTER — Encounter: Payer: Self-pay | Admitting: Nurse Practitioner

## 2013-08-06 ENCOUNTER — Ambulatory Visit (INDEPENDENT_AMBULATORY_CARE_PROVIDER_SITE_OTHER): Payer: BC Managed Care – PPO | Admitting: Nurse Practitioner

## 2013-08-06 VITALS — BP 101/68 | HR 62 | Temp 98.8°F | Ht 67.0 in | Wt 138.0 lb

## 2013-08-06 DIAGNOSIS — D709 Neutropenia, unspecified: Secondary | ICD-10-CM | POA: Insufficient documentation

## 2013-08-06 DIAGNOSIS — R1013 Epigastric pain: Secondary | ICD-10-CM

## 2013-08-06 LAB — CBC
HCT: 44.3 % (ref 36.0–46.0)
Hemoglobin: 14.8 g/dL (ref 12.0–15.0)
MCHC: 33.3 g/dL (ref 30.0–36.0)
MCV: 86.9 fl (ref 78.0–100.0)
Platelets: 175 10*3/uL (ref 150.0–400.0)
RBC: 5.1 Mil/uL (ref 3.87–5.11)
RDW: 13.6 % (ref 11.5–15.5)
WBC: 3.4 10*3/uL — ABNORMAL LOW (ref 4.0–10.5)

## 2013-08-06 NOTE — Patient Instructions (Signed)
Likely stomach pain was gastritis-probably stress related. If pain returns or globus sensation-feeling like food stuck in throat, hoarse, cough-re-start omeprazole at 40 mg daily for 4 weeks & call me if pain does not resolve.  Start probiotic daily: Align or Culterelle. My office will call with lab results and any necessary follow up. Great to see you ! Enjoy the rest of the summer.

## 2013-08-06 NOTE — Progress Notes (Signed)
Subjective:     Laura Ritter is an 48 y.o. female who presents for follow up of heartburn and neutropenia. She began to have epigastric pain several mos ago after death of mother. She was started on 20 mg omeprazole which improved symptoms but did not relieve them. The daily dose was increased to 40 mg. After 2 weeks of 40 mg/day symptoms completely resolved. She has been symptom free for 2 weeks. Stopped med 1 week ago.  The following portions of the patient's history were reviewed and updated as appropriate: allergies, current medications, past family history, past medical history, past social history, past surgical history and problem list.  Review of Systems Pertinent items are noted in HPI.   Objective:     BP 101/68  Pulse 62  Temp(Src) 98.8 F (37.1 C) (Temporal)  Ht 5\' 7"  (1.702 m)  Wt 138 lb (62.596 kg)  BMI 21.61 kg/m2  SpO2 99%  LMP 04/06/2013 General appearance: alert, cooperative, appears stated age and no distress Head: Normocephalic, without obvious abnormality, atraumatic Eyes: negative findings: lids and lashes normal and conjunctivae and sclerae normal   Assessment:    Gastroesophageal Reflux Disease: DD: gastritis. Neutropenia      Plan:   Resolved with 1 month of omeprazole. Start probiotic. Re-start omeprazole if symptoms return & call ofc. If no improvement. Repeat CBC.

## 2013-08-06 NOTE — Progress Notes (Signed)
Pre visit review using our clinic review tool, if applicable. No additional management support is needed unless otherwise documented below in the visit note. 

## 2013-08-11 ENCOUNTER — Telehealth: Payer: Self-pay | Admitting: Nurse Practitioner

## 2013-08-11 NOTE — Telephone Encounter (Signed)
pls call pt: Advise White cells stable. Still lower than normal, but I think this is her normal, given all lab work up in past was normal. Schedule follow up in 6/16 or sooner if persistent fatigue, frequent colds, easy bruising.

## 2013-08-12 NOTE — Telephone Encounter (Signed)
LMOVM for pt to return call 

## 2013-08-12 NOTE — Telephone Encounter (Signed)
Patient returned call and was given results.  

## 2013-10-17 ENCOUNTER — Ambulatory Visit (INDEPENDENT_AMBULATORY_CARE_PROVIDER_SITE_OTHER): Payer: BC Managed Care – PPO | Admitting: Nurse Practitioner

## 2013-10-17 ENCOUNTER — Encounter: Payer: Self-pay | Admitting: Nurse Practitioner

## 2013-10-17 VITALS — BP 118/71 | HR 73 | Temp 98.6°F | Resp 18 | Ht 67.0 in | Wt 138.0 lb

## 2013-10-17 DIAGNOSIS — M25559 Pain in unspecified hip: Secondary | ICD-10-CM

## 2013-10-17 DIAGNOSIS — M25551 Pain in right hip: Secondary | ICD-10-CM

## 2013-10-17 NOTE — Progress Notes (Signed)
Pre visit review using our clinic review tool, if applicable. No additional management support is needed unless otherwise documented below in the visit note. 

## 2013-10-17 NOTE — Patient Instructions (Addendum)
Please see Dr Tamala Julian for further evaluation. I'll look for his notes.  Great to see you!

## 2013-10-17 NOTE — Progress Notes (Signed)
Subjective:    Laura Ritter is a 48 y.o. female who presents with anterior & posterior right hip pain. Onset of the symptoms was year or so ago. Inciting event: remembers sudden onset pain while running up stairs, when pregnant, 14 yrs ago. She has had "hip alignments" multiple times by PT since that event due to pain, stiffness, tight muscles. The patient reports the hip pain worse in morning & with prolonged exercise & sitting. Pain is more intense & frequent in last year & started to radiate into leg. In last few mos she has tried several therapies including:chiropractor did "active release" manipulations that helped pain but die not improve radiating pain. She had dry-needling that helped with radiating pain.  Although she has had improvement, she is concerned about the persistence of the problem. Pain limits exercise-although she may be at risk for overuse syndromes. Evaluation to date: none.  She has no fam Hx of autoimmune disease & no associated symptoms. However, she has had AI work-up due to chronic leukopenia & ground-glass appearance on chest xray. She has had extensive pulm & rheum w/u & determined no f/u needed unless symptomatic.  The following portions of the patient's history were reviewed and updated as appropriate: allergies, current medications, past family history, past medical history, past social history, past surgical history and problem list.   Review of Systems Pertinent items are noted in HPI.   Objective:    BP 118/71  Pulse 73  Temp(Src) 98.6 F (37 C) (Oral)  Resp 18  Ht 5\' 7"  (1.702 m)  Wt 138 lb (62.596 kg)  BMI 21.61 kg/m2  SpO2 98% Right hip: full painless range of motion, without tenderness  Left hip: full painless range of motion, without tenderness     Assessment:  1. Hip pain, right nml exam - Ambulatory referral to sports med F/u PRN

## 2013-10-31 ENCOUNTER — Ambulatory Visit (INDEPENDENT_AMBULATORY_CARE_PROVIDER_SITE_OTHER): Payer: BC Managed Care – PPO | Admitting: Family Medicine

## 2013-10-31 ENCOUNTER — Ambulatory Visit (INDEPENDENT_AMBULATORY_CARE_PROVIDER_SITE_OTHER)
Admission: RE | Admit: 2013-10-31 | Discharge: 2013-10-31 | Disposition: A | Discharge status: Home / Self Care | Payer: BC Managed Care – PPO | Source: Ambulatory Visit | Attending: Family Medicine | Admitting: Family Medicine

## 2013-10-31 ENCOUNTER — Encounter: Payer: Self-pay | Admitting: Family Medicine

## 2013-10-31 VITALS — BP 108/66 | HR 64 | Ht 67.0 in | Wt 143.0 lb

## 2013-10-31 DIAGNOSIS — M999 Biomechanical lesion, unspecified: Secondary | ICD-10-CM | POA: Insufficient documentation

## 2013-10-31 DIAGNOSIS — M24551 Contracture, right hip: Secondary | ICD-10-CM

## 2013-10-31 DIAGNOSIS — M533 Sacrococcygeal disorders, not elsewhere classified: Secondary | ICD-10-CM | POA: Insufficient documentation

## 2013-10-31 DIAGNOSIS — M9902 Segmental and somatic dysfunction of thoracic region: Secondary | ICD-10-CM

## 2013-10-31 DIAGNOSIS — M9905 Segmental and somatic dysfunction of pelvic region: Secondary | ICD-10-CM

## 2013-10-31 DIAGNOSIS — M24559 Contracture, unspecified hip: Secondary | ICD-10-CM | POA: Insufficient documentation

## 2013-10-31 DIAGNOSIS — M9904 Segmental and somatic dysfunction of sacral region: Secondary | ICD-10-CM

## 2013-10-31 NOTE — Assessment & Plan Note (Signed)
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 I do think that this is likely secondary to muscle imbalances with a weak hip abductors in this tight hip flexors. Patient is going to try some range of motion exercises and some strengthening exercises at showed patient proper technique today. Patient will try over-the-counter medications. We'll get x-rays to rule out any intra-articular problems of the right hip. We discussed which activity would be good at this time and which ones to avoid. Patient will come back and see me again in 2-3 weeks for further evaluation and treatment.

## 2013-10-31 NOTE — Progress Notes (Signed)
  [facility] [address] [place], [place] [address] Phone: [phone] Subjective:    I'm seeing this patient by the request  of:  WEAVER, LAYNE C, NP   CC: Right hip pain  [id number] NEVAEH KORTE is a 48 y.o. female coming in with complaint of right hip pain. Patient states that she has had this pain intermittently for multiple years. Patient remembers the first accident when she was running up stairs while pregnant 14 years ago. Patient has gone to physical therapy multiple times secondary to pain and stiffness and tight muscles. Patient states that the pain is worse in the morning as well as with prolonged exercises sitting. 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 Patient has also tried dry needling as well as chiropractor with minimal benefit. Patient states that it does not stop her from her regular daily activities but if she tries to do anything excessive such as working out she has more of a discomfort. Patient is more concerned about a diagnosis than anything else. Patient does not want to take any pain medications and does not take anything over-the-counter. Rates the severity of 4/10 most the time but can get as bad as 7/10.     Past medical history, social, surgical and family history all reviewed in electronic medical record.   Review of Systems: No headache, visual changes, nausea, vomiting, diarrhea, constipation, dizziness, abdominal pain, skin rash, fevers, chills, night sweats, weight loss, swollen lymph nodes, body aches, joint swelling, muscle aches, chest pain, shortness of breath, mood changes.   Objective Blood pressure 108/66, pulse 64, height 5\' 7"  (1.702 m), weight 143 lb (64.864 kg), SpO2 99.00%.  General: No apparent distress alert and oriented x3 mood and affect normal, dressed appropriately.  HEENT: Pupils equal,  extraocular movements intact  Respiratory: Patient's speak in full sentences and does not appear short of breath  Cardiovascular: No lower extremity edema, non tender, no erythema  Skin: Warm dry intact with no signs of infection or rash on extremities or on axial skeleton.  Abdomen: Soft nontender  Neuro: Cranial nerves II through XII are intact, neurovascularly intact in all extremities with 2+ DTRs and 2+ pulses.  Lymph: No lymphadenopathy of posterior or anterior cervical chain or axillae bilaterally.  Gait normal with good balance and coordination.  MSK:  Non tender with full range of motion and good stability and symmetric strength and tone of shoulders, elbows, wrist,  knee and ankles bilaterally.   Hip: Right ROM IR: 25 Deg, ER: 35 Deg, Flexion: 100 Deg, Extension: 60 with excessive tightness of the hip flexor Deg, Abduction: 35 Deg, Adduction: 25 Deg Strength IR: 4/5, ER: 4/5, Flexion: 5/5, Extension: 5/5, Abduction: 4/5, Adduction: 4/5 Pelvic alignment unremarkable to inspection and palpation. Standing hip rotation and gait without trendelenburg sign / unsteadiness. Greater trochanter without tenderness to palpation. No tenderness over piriformis.  Positive Faber No SI joint tenderness and normal minimal SI movement.   OMT Physical Exam  Cervical  C2 flexed rotated and side bent left  Thoracic T5 extended rotated and side bent right  Lumbar L2 flexed rotated and side bent right  Sacrum left on left     Impression and Recommendations:     This case required medical decision making of moderate complexity.

## 2013-10-31 NOTE — Patient Instructions (Signed)
Good to see you Ice 20 minutes 2 times daily. Usually after activity and before bed. Exercises focusing on hip abductor strengthening and hip flexor stretching.  Exercises on wall.  Heel and butt touching.  Raise leg 6 inches and hold 2 seconds.  Down slow for count of 4 seconds.  1 set of 30 reps daily on both sides.  Vitamin  D 2000 IU daily Turmeric 500mg   Xray downstairs today.  Come back in 2-3 weeks for another adjustment/.

## 2013-10-31 NOTE — Assessment & Plan Note (Signed)
Decision today to treat with OMT was based on Physical Exam  After verbal consent patient was treated with HVLA, ME techniques in thoracic, lumbar and sacral as well as pelvic areas  Patient tolerated the procedure well with improvement in symptoms  Patient given exercises, stretches and lifestyle modifications  See medications in patient instructions if given  Patient will follow up in 2-3 weeks

## 2013-11-14 ENCOUNTER — Ambulatory Visit (INDEPENDENT_AMBULATORY_CARE_PROVIDER_SITE_OTHER): Payer: BC Managed Care – PPO | Admitting: Family Medicine

## 2013-11-14 ENCOUNTER — Encounter: Payer: Self-pay | Admitting: Family Medicine

## 2013-11-14 VITALS — BP 94/62 | HR 66 | Ht 67.0 in | Wt 142.0 lb

## 2013-11-14 DIAGNOSIS — M533 Sacrococcygeal disorders, not elsewhere classified: Secondary | ICD-10-CM

## 2013-11-14 DIAGNOSIS — M9902 Segmental and somatic dysfunction of thoracic region: Secondary | ICD-10-CM

## 2013-11-14 DIAGNOSIS — M9905 Segmental and somatic dysfunction of pelvic region: Secondary | ICD-10-CM

## 2013-11-14 DIAGNOSIS — M999 Biomechanical lesion, unspecified: Secondary | ICD-10-CM

## 2013-11-14 DIAGNOSIS — M9904 Segmental and somatic dysfunction of sacral region: Secondary | ICD-10-CM

## 2013-11-14 MED ORDER — NITROGLYCERIN 0.2 MG/HR TD PT24: MEDICATED_PATCH | TRANSDERMAL | Status: DC

## 2013-11-14 NOTE — Assessment & Plan Note (Signed)
Decision today to treat with OMT was based on Physical Exam  After verbal consent patient was treated with HVLA, ME techniques in thoracic, lumbar and sacral as well as pelvic areas  Patient tolerated the procedure well with improvement in symptoms  Patient given exercises, stretches and lifestyle modifications  See medications in patient instructions if given  Patient will follow up in 3-4 weeks    

## 2013-11-14 NOTE — Patient Instructions (Addendum)
Good to see you Ice is still your friend.  Continue the exercises regularly.  Start what you want 2 times a week.   Nitroglycerin Protocol   Apply 1/4 nitroglycerin patch to affected area daily.  Change position of patch within the affected area every 24 hours.  You may experience a headache during the first 1-2 weeks of using the patch, these should subside.  If you experience headaches after beginning nitroglycerin patch treatment, you may take your preferred over the counter pain reliever.  Another side effect of the nitroglycerin patch is skin irritation or rash related to patch adhesive.  Please notify our office if you develop more severe headaches or rash, and stop the patch.  Tendon healing with nitroglycerin patch may require 12 to 24 weeks depending on the extent of injury.  Men should not use if taking Viagra, Cialis, or Levitra.   Do not use if you have migraines or rosacea.

## 2013-11-14 NOTE — Progress Notes (Signed)
  [facility] [address] [place], [place] [address] Phone: [phone] Subjective:    I'm seeing this patient by the request  of:  WEAVER, LAYNE C, NP   CC: Right hip pain  [id number] Laura Ritter is a 48 y.o. female coming in with complaint of right hip pain. Patient was seen previously and had more of a sacroiliac joint dysfunction. Patient also had tight hip flexors. Patient was given home exercises and did respond very well to osteopathic manipulation. Patient was told to get over-the-counter medications and states Patient did get x-rays after last visit for her hip and they were completely unremarkable. These were reviewed by me today.  Patient states that overall she has noticed improvement. Patient states that she is not having as much pain she did previously. Patient states that she's been able to do her job a little bit more fluid. Patient still notices some tightness when she tries to flex over but overall is doing better.    Past medical history, social, surgical and family history all reviewed in electronic medical record.   Review of Systems: No headache, visual changes, nausea, vomiting, diarrhea, constipation, dizziness, abdominal pain, skin rash, fevers, chills, night sweats, weight loss, swollen lymph nodes, body aches, joint swelling, muscle aches, chest pain, shortness of breath, mood changes.   Objective Blood pressure 94/62, pulse 66, height 5\' 7"  (1.702 m), weight 142 lb (64.411 kg), last menstrual period 10/20/2013, SpO2 99.00%.  General: No apparent distress alert and oriented x3 mood and affect normal, dressed appropriately.  HEENT: Pupils equal, extraocular movements intact  Respiratory: Patient's speak in full sentences and does not appear short of breath  Cardiovascular: No lower extremity edema, non tender, no erythema  Skin: Warm dry intact with no signs of infection or rash on extremities or on axial skeleton.  Abdomen:  Soft nontender  Neuro: Cranial nerves II through XII are intact, neurovascularly intact in all extremities with 2+ DTRs and 2+ pulses.  Lymph: No lymphadenopathy of posterior or anterior cervical chain or axillae bilaterally.  Gait normal with good balance and coordination.  MSK:  Non tender with full range of motion and good stability and symmetric strength and tone of shoulders, elbows, wrist,  knee and ankles bilaterally.   Hip: Right ROM IR: 25 Deg, ER: 35 Deg, Flexion: 100 Deg, Extension: 60 continued tightness with hip flexor Deg, Abduction: 35 Deg, Adduction: 25 Deg Strength IR: 4/5, ER: 4/5, Flexion: 5/5, Extension: 5/5, Abduction: 4/5, Adduction: 4/5 Pelvic alignment unremarkable to inspection and palpation. Standing hip rotation and gait without trendelenburg sign / unsteadiness. Greater trochanter without tenderness to palpation. No tenderness over piriformis.  Positive Faber No SI joint tenderness and normal minimal SI movement.   OMT Physical Exam  Cervical  C2 flexed rotated and side bent left  Thoracic T5 extended rotated and side bent right  Lumbar L2 flexed rotated and side bent right  Sacrum left on left     Impression and Recommendations:     This case required medical decision making of moderate complexity.

## 2013-11-14 NOTE — Assessment & Plan Note (Signed)
Patient overall is doing remarkably better even after one manipulation treatment. We discussed with her to do more of the exercises and working on the hip abductor strengthening and truly the hip flexor stretching. We discussed proper shoe choices as well him continuing the over-the-counter medications. Patient states that she has not been religiously taking no medicines on a regular basis. We also discussed that avoiding significant fatigue whenever possible can he helpful. Patient will continue with these type of conservative therapies and come back and see me again in 3-4 weeks.

## 2013-11-21 ENCOUNTER — Telehealth: Payer: Self-pay | Admitting: Sports Medicine

## 2013-11-21 NOTE — Telephone Encounter (Signed)
Laura Ritter can now start working on routine rehabilitation, pain permitting.

## 2013-11-21 NOTE — Telephone Encounter (Signed)
Left detailed message on mother mvm with instructions as noted below. Jeaneen Cala,CMA

## 2013-11-21 NOTE — Telephone Encounter (Signed)
Dr. T please see note below. Rhonda Cunningham,CMA  

## 2013-11-21 NOTE — Telephone Encounter (Signed)
Patient walked-in request to leave a note if Edison Nasuti could do anything besides Rom before he comes back Nov. 10th? Balance/Propriocation something. Thanks [phone]

## 2013-11-25 ENCOUNTER — Ambulatory Visit: Payer: BC Managed Care – PPO | Admitting: Sports Medicine

## 2013-12-01 ENCOUNTER — Encounter: Payer: Self-pay | Admitting: Family Medicine

## 2013-12-05 ENCOUNTER — Ambulatory Visit (INDEPENDENT_AMBULATORY_CARE_PROVIDER_SITE_OTHER): Payer: BC Managed Care – PPO | Admitting: Family Medicine

## 2013-12-05 ENCOUNTER — Encounter: Payer: Self-pay | Admitting: Family Medicine

## 2013-12-05 VITALS — BP 112/62 | HR 68 | Ht 67.0 in | Wt 141.0 lb

## 2013-12-05 DIAGNOSIS — M9902 Segmental and somatic dysfunction of thoracic region: Secondary | ICD-10-CM

## 2013-12-05 DIAGNOSIS — M533 Sacrococcygeal disorders, not elsewhere classified: Secondary | ICD-10-CM

## 2013-12-05 DIAGNOSIS — M9904 Segmental and somatic dysfunction of sacral region: Secondary | ICD-10-CM

## 2013-12-05 DIAGNOSIS — M999 Biomechanical lesion, unspecified: Secondary | ICD-10-CM

## 2013-12-05 DIAGNOSIS — M7601 Gluteal tendinitis, right hip: Secondary | ICD-10-CM

## 2013-12-05 DIAGNOSIS — M9905 Segmental and somatic dysfunction of pelvic region: Secondary | ICD-10-CM

## 2013-12-05 NOTE — Patient Instructions (Signed)
Good to see you Ibuprofen 600mg  three times  a day for 3 days.  Go to town and do everything you want.  Call me in 3 days if you want to try gabapentin  Would do 100mg  tat night See me again in 2 weeks.

## 2013-12-05 NOTE — Assessment & Plan Note (Addendum)
As stated above Patient is having trouble with the firing of the gluteal tendon with abduction. This could be nerve related or it could be tendinitis. Patient will be treated more for tendinitis at this time and if continuing up to make improvement we'll start her on gabapentin. Patient continues to have difficulty we may need to consider x-rays of the back.

## 2013-12-05 NOTE — Progress Notes (Signed)
  [facility] [address], [place] [address] Phone: [phone] Subjective:      CC: Right hip pain follow up  [id number] Laura Ritter is a 48 y.o. female coming in with complaint of right hip pain. Patient was seen previously and had more of a sacroiliac joint dysfunction.   Patient is having an exacerbation of the pain. This seems to be the exact same pain that she has had previously. Patient has been going to formal physical therapy without any significant improvement. Patient did try the nitroglycerin patches but was given a migraine. Patient states that it is so sore it is stopping her from certain activities. Patient denies any radiation down the leg or any numbness. States though that if any flexing can cause her discomfort. Continues to work on the hip flexor tightness.        Past medical history, social, surgical and family history all reviewed in electronic medical record.   Review of Systems: No headache, visual changes, nausea, vomiting, diarrhea, constipation, dizziness, abdominal pain, skin rash, fevers, chills, night sweats, weight loss, swollen lymph nodes, body aches, joint swelling, muscle aches, chest pain, shortness of breath, mood changes.   Objective Blood pressure 112/62, pulse 68, height 5\' 7"  (1.702 m), weight 141 lb (63.957 kg), SpO2 98 %.  General: No apparent distress alert and oriented x3 mood and affect normal, dressed appropriately.  HEENT: Pupils equal, extraocular movements intact  Respiratory: Patient's speak in full sentences and does not appear short of breath  Cardiovascular: No lower extremity edema, non tender, no erythema  Skin: Warm dry intact with no signs of infection or rash on extremities or on axial skeleton.  Abdomen: Soft nontender  Neuro: Cranial nerves II through XII are intact, neurovascularly intact in all extremities with 2+ DTRs and 2+ pulses.  Lymph: No lymphadenopathy of posterior or  anterior cervical chain or axillae bilaterally.  Gait normal with good balance and coordination.  MSK:  Non tender with full range of motion and good stability and symmetric strength and tone of shoulders, elbows, wrist,  knee and ankles bilaterally.   Hip: Right ROM IR: 25 Deg, ER: 35 Deg, Flexion: 100 Deg, Extension: 60 continued tightness with hip flexor Deg, Abduction: 35 Deg, Adduction: 25 Deg Strength IR: 4/5, ER: 4/5, Flexion: 5/5, Extension: 5/5, Abduction: 4/5, Adduction: 4/5 Pelvic alignment unremarkable to inspection and palpation. Standing hip rotation and gait without trendelenburg sign / unsteadiness. Greater trochanter without tenderness to palpation. No tenderness over piriformis. Patient does have some tenderness over the gluteal tendon at its origin on the sacrum Positive Faber No SI joint tenderness and normal minimal SI movement.   OMT Physical Exam  Cervical  C2 flexed rotated and side bent left  Thoracic T5 extended rotated and side bent right  Lumbar L2 flexed rotated and side bent right  Sacrum left on left     Impression and Recommendations:     This case required medical decision making of moderate complexity.

## 2013-12-05 NOTE — Assessment & Plan Note (Signed)
Continues to have difficulty overall. Patient is having an exacerbation of the do think that there is some gluteal tendon injury as well. This could be questionable to a tendinitis versus a possible nerve irritation. We discussed different treatment options at this time. Patient did respond very well to the osteopathic manipulation today. Patient will do ibuprofen 3 times daily for the next 3 days. This does not seem to help patient will call back and we'll start her on neuromodulator such as gabapentin at 100 mg nightly. Patient encouraged to increase activity.

## 2013-12-12 ENCOUNTER — Telehealth: Payer: Self-pay | Admitting: Family Medicine

## 2013-12-12 MED ORDER — GABAPENTIN 100 MG PO CAPS
100.0000 mg | ORAL_CAPSULE | Freq: Every day | ORAL | Status: DC
Start: 2013-12-12 — End: 2014-03-27

## 2013-12-12 NOTE — Telephone Encounter (Signed)
Per ov 12/05/13 if sxs not better would rx gabapentin 100mg  at bedtime. Notified pt sent rx to [facility]....Johny Chess

## 2013-12-12 NOTE — Telephone Encounter (Signed)
Patient is requesting script for gabapentin.  Patient uses [facility] in [place].

## 2013-12-18 ENCOUNTER — Ambulatory Visit: Payer: BC Managed Care – PPO | Admitting: Nurse Practitioner

## 2013-12-19 ENCOUNTER — Ambulatory Visit (INDEPENDENT_AMBULATORY_CARE_PROVIDER_SITE_OTHER): Payer: BC Managed Care – PPO | Admitting: Family Medicine

## 2013-12-19 ENCOUNTER — Encounter: Payer: Self-pay | Admitting: Family Medicine

## 2013-12-19 VITALS — BP 102/62 | HR 67 | Ht 67.0 in | Wt 142.0 lb

## 2013-12-19 DIAGNOSIS — M9905 Segmental and somatic dysfunction of pelvic region: Secondary | ICD-10-CM

## 2013-12-19 DIAGNOSIS — M999 Biomechanical lesion, unspecified: Secondary | ICD-10-CM

## 2013-12-19 DIAGNOSIS — M7601 Gluteal tendinitis, right hip: Secondary | ICD-10-CM

## 2013-12-19 DIAGNOSIS — M9904 Segmental and somatic dysfunction of sacral region: Secondary | ICD-10-CM

## 2013-12-19 DIAGNOSIS — M9902 Segmental and somatic dysfunction of thoracic region: Secondary | ICD-10-CM

## 2013-12-19 NOTE — Progress Notes (Signed)
  [facility] [place], [place] [address] Phone: [phone] Subjective:      CC: Right hip pain follow up  [id number] Laura Ritter is a 48 y.o. female coming in with complaint of right hip pain. Patient was seen previously and had more of a sacroiliac joint dysfunction as well as a glial tendon injury. There is concern the patient was having more of a radicular symptom and was started on gabapentin 100 mg at night. Patient states she is continue conservative therapy at this time. Patient has been doing a lot more core exercises. Patient states she is doing significantly better. Patient states that she has not had any severe back pain at all. Patient is able to do all activities of daily living and has increased her activity significantly. Patient continues with the gabapentin at night which she thinks might be minimally helpful. No new symptoms.       Past medical history, social, surgical and family history all reviewed in electronic medical record.   Review of Systems: No headache, visual changes, nausea, vomiting, diarrhea, constipation, dizziness, abdominal pain, skin rash, fevers, chills, night sweats, weight loss, swollen lymph nodes, body aches, joint swelling, muscle aches, chest pain, shortness of breath, mood changes.   Objective Blood pressure 102/62, pulse 67, height 5\' 7"  (1.702 m), weight 142 lb (64.411 kg), SpO2 97 %.  General: No apparent distress alert and oriented x3 mood and affect normal, dressed appropriately.  HEENT: Pupils equal, extraocular movements intact  Respiratory: Patient's speak in full sentences and does not appear short of breath  Cardiovascular: No lower extremity edema, non tender, no erythema  Skin: Warm dry intact with no signs of infection or rash on extremities or on axial skeleton.  Abdomen: Soft nontender  Neuro: Cranial nerves II through XII are intact, neurovascularly intact in all extremities with  2+ DTRs and 2+ pulses.  Lymph: No lymphadenopathy of posterior or anterior cervical chain or axillae bilaterally.  Gait normal with good balance and coordination.  MSK:  Non tender with full range of motion and good stability and symmetric strength and tone of shoulders, elbows, wrist,  knee and ankles bilaterally.   Hip: Right ROM IR: 25 Deg, ER: 35 Deg, Flexion: 100 Deg, Extension: 60 continued tightness with hip flexor Deg, Abduction: 35 Deg, Adduction: 25 Deg Strength IR: 4/5, ER: 4/5, Flexion: 5/5, Extension: 5/5, Abduction: 4/5, Adduction: 4/5 Pelvic alignment unremarkable to inspection and palpation. Standing hip rotation and gait without trendelenburg sign / unsteadiness. Greater trochanter without tenderness to palpation. No tenderness over piriformis. No SI joint tenderness and normal minimal SI movement.   OMT Physical Exam  Cervical  C2 flexed rotated and side bent left  Thoracic T5 extended rotated and side bent right  Lumbar L2 flexed rotated and side bent right  Sacrum left on left     Impression and Recommendations:     This case required medical decision making of moderate complexity.

## 2013-12-19 NOTE — Assessment & Plan Note (Signed)
Patient is doing much better at this time. Patient is to continue with these exercises and we give her more balance and coordination as well as core stabilization techniques that I think will be beneficial. 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  We discussed different manual massage techniques as well as manual manipulation techniques that may be beneficial when patient is having some flare. Patient then otherwise is going to follow-up with me in regular intervals but we will space amount of 4-6 weeks.

## 2013-12-19 NOTE — Assessment & Plan Note (Signed)
Decision today to treat with OMT was based on Physical Exam  After verbal consent patient was treated with HVLA, ME techniques in thoracic, lumbar and sacral as well as pelvic areas  Patient tolerated the procedure well with improvement in symptoms  Patient given exercises, stretches and lifestyle modifications  See medications in patient instructions if given  Patient will follow up in 4-6 weeks

## 2013-12-19 NOTE — Patient Instructions (Addendum)
I am impress Tennis ball in the pocket with driving.  Continue what you are doing.  Continue the gabapentin at this time.  Otherwise call me when you need me.

## 2013-12-30 ENCOUNTER — Encounter: Payer: Self-pay | Admitting: Family Medicine

## 2013-12-30 ENCOUNTER — Ambulatory Visit (INDEPENDENT_AMBULATORY_CARE_PROVIDER_SITE_OTHER): Payer: BC Managed Care – PPO | Admitting: Family Medicine

## 2013-12-30 ENCOUNTER — Ambulatory Visit (INDEPENDENT_AMBULATORY_CARE_PROVIDER_SITE_OTHER)
Admission: RE | Admit: 2013-12-30 | Discharge: 2013-12-30 | Disposition: A | Discharge status: Home / Self Care | Payer: BC Managed Care – PPO | Source: Ambulatory Visit | Attending: Family Medicine | Admitting: Family Medicine

## 2013-12-30 VITALS — BP 110/68 | HR 72 | Ht 67.0 in | Wt 144.0 lb

## 2013-12-30 DIAGNOSIS — M9905 Segmental and somatic dysfunction of pelvic region: Secondary | ICD-10-CM

## 2013-12-30 DIAGNOSIS — M533 Sacrococcygeal disorders, not elsewhere classified: Secondary | ICD-10-CM

## 2013-12-30 DIAGNOSIS — M9904 Segmental and somatic dysfunction of sacral region: Secondary | ICD-10-CM

## 2013-12-30 DIAGNOSIS — M5416 Radiculopathy, lumbar region: Secondary | ICD-10-CM

## 2013-12-30 DIAGNOSIS — M9902 Segmental and somatic dysfunction of thoracic region: Secondary | ICD-10-CM

## 2013-12-30 DIAGNOSIS — M999 Biomechanical lesion, unspecified: Secondary | ICD-10-CM

## 2013-12-30 MED ORDER — KETOROLAC TROMETHAMINE 60 MG/2ML IM SOLN
60.0000 mg | Freq: Once | INTRAMUSCULAR | Status: AC
  Administered 2013-12-30: 60 mg via INTRAMUSCULAR

## 2013-12-30 MED ORDER — CYCLOBENZAPRINE HCL 10 MG PO TABS: 10.0000 mg | ORAL_TABLET | Freq: Three times a day (TID) | ORAL | Status: DC | PRN

## 2013-12-30 MED ORDER — TRAMADOL HCL 50 MG PO TABS: 50.0000 mg | ORAL_TABLET | Freq: Every evening | ORAL | Status: DC | PRN

## 2013-12-30 NOTE — Assessment & Plan Note (Signed)
Decision today to treat with OMT was based on Physical Exam  After verbal consent patient was treated with HVLA, ME techniques in thoracic, lumbar and sacral as well as pelvic areas  Patient tolerated the procedure well with improvement in symptoms  Patient given exercises, stretches and lifestyle modifications  See medications in patient instructions if given  Patient will follow up in 2 weeks

## 2013-12-30 NOTE — Progress Notes (Signed)
  [facility] [address] [place], [place] [address] Phone: [phone] Subjective:      CC: Right hip pain follow up  [id number] Laura Ritter is a 48 y.o. female coming in with complaint of right hip pain. Patient was seen previously and had more of a sacroiliac joint dysfunction as well as a glial tendon injury. There is concern the patient was having more of a radicular symptom and was started on gabapentin 100 mg at night. Patient states she is continue conservative therapy at this time. Patient has been doing a lot more core exercises. Patient states she is doing significantly better. Patient though did travel for the holidays and after sitting in the car had more exacerbation as well as spasming in the right posterior aspect. Patient and partially needed take pain medication for this pain. Patient states that she finds it difficult to even ambulate and was using a walker for short amount of time. Patient states that it is improving very slowly. Patient always feels better after manipulation.     Past medical history, social, surgical and family history all reviewed in electronic medical record.   Review of Systems: No headache, visual changes, nausea, vomiting, diarrhea, constipation, dizziness, abdominal pain, skin rash, fevers, chills, night sweats, weight loss, swollen lymph nodes, body aches, joint swelling, muscle aches, chest pain, shortness of breath, mood changes.   Objective Blood pressure 110/68, pulse 72, height 5\' 7"  (1.702 m), weight 144 lb (65.318 kg), SpO2 97 %.  General: No apparent distress alert and oriented x3 mood and affect normal, dressed appropriately.  HEENT: Pupils equal, extraocular movements intact  Respiratory: Patient's speak in full sentences and does not appear short of breath  Cardiovascular: No lower extremity edema, non tender, no erythema  Skin: Warm dry intact with no signs of infection or rash on extremities or on  axial skeleton.  Abdomen: Soft nontender  Neuro: Cranial nerves II through XII are intact, neurovascularly intact in all extremities with 2+ DTRs and 2+ pulses.  Lymph: No lymphadenopathy of posterior or anterior cervical chain or axillae bilaterally.  Gait normal with good balance and coordination.  MSK:  Non tender with full range of motion and good stability and symmetric strength and tone of shoulders, elbows, wrist,  knee and ankles bilaterally.   Hip: Right ROM IR: 25 Deg, ER: 35 Deg, Flexion: 100 Deg, Extension: 60 continued tightness with hip flexor Deg, Abduction: 35 Deg, Adduction: 25 Deg Strength IR: 4/5, ER: 4/5, Flexion: 5/5, Extension: 5/5, Abduction: 4/5, Adduction: 4/5 Pelvic alignment unremarkable to inspection and palpation. Standing hip rotation and gait without trendelenburg sign / unsteadiness. Greater trochanter without tenderness to palpation. No tenderness over piriformis. No SI joint tenderness and normal minimal SI movement.   OMT Physical Exam  Cervical  Neutral  Thoracic T5 extended rotated and side bent right  Lumbar L2 flexed rotated and side bent right  Sacrum left on left with spasming of the gluteal tendon     Impression and Recommendations:     This case required medical decision making of moderate complexity.

## 2013-12-30 NOTE — Addendum Note (Signed)
Addended by: Douglass Rivers T on: 12/30/2013 09:20 AM   Modules accepted: Orders

## 2013-12-30 NOTE — Patient Instructions (Addendum)
Good to see you as always.  We should consider xray of your back at some point.  Ice is your friend Flexeril up to 3 times daily Tramadol if really in pain.  Continue the exercises.  Injection today to help get you through the day.  See me again in 2 weeks.

## 2013-12-30 NOTE — Assessment & Plan Note (Signed)
Continues to have sacroiliac joint dysfunction and likely is having an exacerbation due to sitting for long amount of time. Differential also includes lumbar radiculopathy. Patient will continue on the gabapentin regularly. Patient was given muscle relaxers today to see if this will make any benefit beneficial and was given a shot of Toradol. We will increase the frequency of patient's visits and have her come back in 2 weeks. Other differential also includes autoimmune disease with patient has been worked up. Patient did have a fairly elevated angiotensin-converting enzyme test in June as the upper level of normal. If continuing to have difficulty we may need to consider further imaging of the back as well as potentially repeat some labs. We'll discuss further at follow-up in 2 weeks.  Spent greater than 25 minutes with patient face-to-face and had greater than 50% of counseling including as described above in assessment and plan.

## 2014-01-01 ENCOUNTER — Telehealth: Payer: Self-pay | Admitting: *Deleted

## 2014-01-01 DIAGNOSIS — M5416 Radiculopathy, lumbar region: Secondary | ICD-10-CM

## 2014-01-01 NOTE — Telephone Encounter (Signed)
Called patient back in patient is having worsening pain. I'm concerned that there is possible herniated disc first S1 nerve root impingement secondary to piriformis syndrome. Discuss patient's option which either includes MRI of the lumbar spine or ultrasound guided piriformis injection. Patient states she would like to check with her insurance company. If MRI is fairly financially possible she would like to probably go that route. Patient called the morning.

## 2014-01-01 NOTE — Telephone Encounter (Signed)
Pt called stating that she is feeling much worse. She's been working but she says the pain is almost unbearable. She has been using the traction & she thinks that may have helped some but she is still having lots of pain, worse in the morning.

## 2014-01-02 NOTE — Telephone Encounter (Signed)
Pt called back and pt wants to have an MRI (pt requests ASAP - Tuesday Dec 8th works for her) I told her I would relay the message. Pt can be reached at [phone].

## 2014-01-02 NOTE — Telephone Encounter (Signed)
Referral entered  

## 2014-01-07 ENCOUNTER — Other Ambulatory Visit: Payer: Self-pay | Admitting: *Deleted

## 2014-01-07 ENCOUNTER — Ambulatory Visit
Admission: RE | Admit: 2014-01-07 | Discharge: 2014-01-07 | Disposition: A | Discharge status: Home / Self Care | Payer: BC Managed Care – PPO | Source: Ambulatory Visit | Attending: Family Medicine | Admitting: Family Medicine

## 2014-01-07 DIAGNOSIS — M5416 Radiculopathy, lumbar region: Secondary | ICD-10-CM

## 2014-01-07 DIAGNOSIS — M5431 Sciatica, right side: Secondary | ICD-10-CM

## 2014-01-09 ENCOUNTER — Ambulatory Visit: Payer: BC Managed Care – PPO | Admitting: Family Medicine

## 2014-01-13 ENCOUNTER — Ambulatory Visit
Admission: RE | Admit: 2014-01-13 | Discharge: 2014-01-13 | Disposition: A | Discharge status: Home / Self Care | Payer: BC Managed Care – PPO | Source: Ambulatory Visit | Attending: Family Medicine | Admitting: Family Medicine

## 2014-01-13 ENCOUNTER — Other Ambulatory Visit: Payer: Self-pay | Admitting: Family Medicine

## 2014-01-13 DIAGNOSIS — M5431 Sciatica, right side: Secondary | ICD-10-CM

## 2014-01-13 MED ORDER — METHYLPREDNISOLONE ACETATE 40 MG/ML INJ SUSP (RADIOLOG
120.0000 mg | Freq: Once | INTRAMUSCULAR | Status: AC
  Administered 2014-01-13: 120 mg via EPIDURAL

## 2014-01-13 MED ORDER — IOHEXOL 180 MG/ML  SOLN
1.0000 mL | Freq: Once | INTRAMUSCULAR | Status: AC | PRN
  Administered 2014-01-13: 1 mL via EPIDURAL

## 2014-01-13 NOTE — Discharge Instructions (Signed)

## 2014-01-20 ENCOUNTER — Encounter: Payer: Self-pay | Admitting: Family Medicine

## 2014-01-20 ENCOUNTER — Ambulatory Visit (INDEPENDENT_AMBULATORY_CARE_PROVIDER_SITE_OTHER): Payer: BC Managed Care – PPO | Admitting: Family Medicine

## 2014-01-20 VITALS — BP 108/80 | HR 68 | Ht 67.0 in | Wt 138.0 lb

## 2014-01-20 DIAGNOSIS — S3422XA Injury of nerve root of sacral spine, initial encounter: Secondary | ICD-10-CM | POA: Insufficient documentation

## 2014-01-20 DIAGNOSIS — M5431 Sciatica, right side: Secondary | ICD-10-CM

## 2014-01-20 DIAGNOSIS — M5116 Intervertebral disc disorders with radiculopathy, lumbar region: Secondary | ICD-10-CM | POA: Insufficient documentation

## 2014-01-20 NOTE — Progress Notes (Signed)
  [facility] [address] [place], [place] [address] Phone: [phone] Subjective:    CC: Right hip pain follow up  [id number] KORINA TRETTER is a 48 y.o. female coming in with complaint of right hip pain. Patient was seen previously and had more of a sacroiliac joint dysfunction as well is a possibility of lumbar radiculopathy. Patient was responding somewhat to the gabapentin but continued to have some layers. Patient continued to have pain so an MRI was ordered. Patient did have an MRI showing a disc protrusion causing severe compression on the right S1 nerve root which would be associated with her pain.  Patient then was sent for an epidural. This was 2 weeks ago. Patient is continuing to feel better. Patient states that the pain is starting to come back slowly. Patient states that approximately 70% of the pain seemed to improve. Patient states that she is able to do more activity and stay in a long amount of time but sitting still seems to be what aggravates it. Denies any nighttime awakening. Patient is though concern that it will just returned. Patient does not want to do repetitive injections and would like more of a curative measure.  Past medical history, social, surgical and family history all reviewed in electronic medical record.   Review of Systems: No headache, visual changes, nausea, vomiting, diarrhea, constipation, dizziness, abdominal pain, skin rash, fevers, chills, night sweats, weight loss, swollen lymph nodes, body aches, joint swelling, muscle aches, chest pain, shortness of breath, mood changes.   Objective Blood pressure 108/80, pulse 68, height 5\' 7"  (1.702 m), weight 138 lb (62.596 kg), last menstrual period 12/16/2013, SpO2 98 %.  General: No apparent distress alert and oriented x3 mood and affect normal, dressed appropriately.  HEENT: Pupils equal, extraocular movements intact  Respiratory: Patient's speak in full sentences and  does not appear short of breath  Cardiovascular: No lower extremity edema, non tender, no erythema  Skin: Warm dry intact with no signs of infection or rash on extremities or on axial skeleton.  Abdomen: Soft nontender  Neuro: Cranial nerves II through XII are intact, neurovascularly intact in all extremities with 2+ DTRs and 2+ pulses.  Lymph: No lymphadenopathy of posterior or anterior cervical chain or axillae bilaterally.  Gait normal with good balance and coordination.  MSK:  Non tender with full range of motion and good stability and symmetric strength and tone of shoulders, elbows, wrist,  knee and ankles bilaterally.   Hip: Right ROM IR: 25 Deg, ER: 35 Deg, Flexion: 100 Deg, Extension: 60 continued tightness with hip flexor Deg, Abduction: 35 Deg, Adduction: 25 Deg Strength IR: 4/5, ER: 4/5, Flexion: 5/5, Extension: 5/5, Abduction: 4/5, Adduction: 4/5 Pelvic alignment unremarkable to inspection and palpation. Standing hip rotation and gait without trendelenburg sign / unsteadiness. Greater trochanter without tenderness to palpation. No tenderness over piriformis.  Patient though does have a positive straight leg test on the right side moderate tenderness to palpation over the L5-S1 paraspinal musculature on the left side.       Impression and Recommendations:     This case required medical decision making of moderate complexity.

## 2014-01-20 NOTE — Patient Instructions (Signed)
Good to see you Pool would be great. Ronnald Ramp would be good.  Continue what you are doing now.  Call lme for whatever.  Happy holidays!

## 2014-01-20 NOTE — Assessment & Plan Note (Signed)
Patient does have an MRI finding that is consistent with her symptoms. Patient did respond very well to the epidural steroid injection and we discussed about possibly doing a another injection. Patient would like more curative measure. Patient has elected to be referred to neurosurgery for further evaluation. I do think patient would respond very well to a microdiscectomy. Patient will discuss this with the neurosurgeon. Patient knows that I will give her any refill medications as necessary until she is by the neurosurgeon. Patient will follow-up with me on an as-needed basis. All questions were answered today. We discussed patient's MRI in great detail and showed pictures.  Spent greater than 25 minutes with patient face-to-face and had greater than 50% of counseling including as described above in assessment and plan.

## 2014-01-30 HISTORY — PX: BACK SURGERY: SHX140

## 2014-01-30 HISTORY — PX: LUMBAR LAMINECTOMY: SHX95

## 2014-02-03 ENCOUNTER — Telehealth: Payer: Self-pay | Admitting: Certified Nurse Midwife

## 2014-02-03 NOTE — Telephone Encounter (Signed)
Left message regarding upcoming appointment has been canceled and needs to be rescheduled. °

## 2014-02-04 ENCOUNTER — Ambulatory Visit: Payer: BC Managed Care – PPO | Admitting: Certified Nurse Midwife

## 2014-02-23 ENCOUNTER — Ambulatory Visit: Payer: Self-pay | Admitting: Certified Nurse Midwife

## 2014-03-27 ENCOUNTER — Encounter: Payer: Self-pay | Admitting: Certified Nurse Midwife

## 2014-03-27 ENCOUNTER — Ambulatory Visit (INDEPENDENT_AMBULATORY_CARE_PROVIDER_SITE_OTHER): Payer: BLUE CROSS/BLUE SHIELD | Admitting: Certified Nurse Midwife

## 2014-03-27 VITALS — BP 110/70 | HR 72 | Resp 16 | Ht 67.0 in | Wt 141.0 lb

## 2014-03-27 DIAGNOSIS — Z124 Encounter for screening for malignant neoplasm of cervix: Secondary | ICD-10-CM

## 2014-03-27 DIAGNOSIS — Z01419 Encounter for gynecological examination (general) (routine) without abnormal findings: Secondary | ICD-10-CM

## 2014-03-27 DIAGNOSIS — B3731 Acute candidiasis of vulva and vagina: Secondary | ICD-10-CM

## 2014-03-27 DIAGNOSIS — N951 Menopausal and female climacteric states: Secondary | ICD-10-CM

## 2014-03-27 DIAGNOSIS — B373 Candidiasis of vulva and vagina: Secondary | ICD-10-CM

## 2014-03-27 DIAGNOSIS — Z0000 Encounter for general adult medical examination without abnormal findings: Secondary | ICD-10-CM

## 2014-03-27 DIAGNOSIS — N912 Amenorrhea, unspecified: Secondary | ICD-10-CM

## 2014-03-27 LAB — POCT URINALYSIS DIPSTICK
Bilirubin, UA: NEGATIVE
Blood, UA: NEGATIVE
Glucose, UA: NEGATIVE
Ketones, UA: NEGATIVE
Nitrite, UA: NEGATIVE
Protein, UA: NEGATIVE
Urobilinogen, UA: NEGATIVE
pH, UA: 5

## 2014-03-27 MED ORDER — FLUCONAZOLE 150 MG PO TABS: ORAL_TABLET | ORAL | Status: DC

## 2014-03-27 NOTE — Patient Instructions (Signed)

## 2014-03-27 NOTE — Progress Notes (Signed)
49 y.o. [id number] Married  Caucasian Fe here for annual exam.  Perimenopausal with scant bleeding for 3 day duration. Occasional hot flash and night sweats, no insomnia. Sees Nicky Pugh, NP for aex and labs. LMP 01/12/14, periods usually every 3 months. Has been having some vaginal itching and slight increase in discharge over past week. Recent back surgery for disc issues with antibiotic use. Please check. No other health issues today.  Patient's last menstrual period was 01/12/2014.          Sexually active: Yes.    The current method of family planning is vasectomy.    Exercising: Yes.    walking Smoker:  no  Health Maintenance: Pap:  11-27-11 neg HPV neg MMG: 07-08-13 category b density,birads 1:neg Colonoscopy:  none BMD:   none TDaP:  2007 Labs: poct urine-wbc 2+ Self breast exam: done occ   reports that she has never smoked. She has never used smokeless tobacco. She reports that she drinks about 1.8 oz of alcohol per week. She reports that she does not use illicit drugs.  Past Medical History  Diagnosis Date  . Renal calculi 4/09    followed yearly follow up- Dr. Jeffie Pollock  . Migraines   . Screening for HIV (human immunodeficiency virus)     Past Surgical History  Procedure Laterality Date  . Cesarean section      x3  . Laparoscopic cholecystectomy  2001  . Refractive surgery Bilateral 2006    lasik    Current Outpatient Prescriptions  Medication Sig Dispense Refill  . CALCIUM PO Take by mouth 2 (two) times daily. OTC    . Cholecalciferol (VITAMIN D-3 PO) Take by mouth daily.    . Multiple Vitamins-Minerals (MULTIVITAMIN PO) Take by mouth.     No current facility-administered medications for this visit.    Family History  Problem Relation Age of Onset  . Hyperlipidemia Mother   . Alcohol abuse Mother   . Arthritis Mother   . Alzheimer's disease Mother   . Hypertension Father   . Parkinson's disease Father   . Alcohol abuse Father   . Kidney Stones Brother   .  Alcohol abuse Brother   . Migraines Son     ROS:  Pertinent items are noted in HPI.  Otherwise, a comprehensive ROS was negative.  Exam:   BP 110/70 mmHg  Pulse 72  Resp 16  Ht 5\' 7"  (1.702 m)  Wt 141 lb (63.957 kg)  BMI 22.08 kg/m2  LMP 01/12/2014 Height: 5\' 7"  (170.2 cm) Ht Readings from Last 3 Encounters:  03/27/14 5\' 7"  (1.702 m)  01/20/14 5\' 7"  (1.702 m)  12/30/13 5\' 7"  (1.702 m)    General appearance: alert, cooperative and appears stated age Head: Normocephalic, without obvious abnormality, atraumatic Neck: no adenopathy, supple, symmetrical, trachea midline and thyroid normal to inspection and palpation Lungs: clear to auscultation bilaterally Breasts: normal appearance, no masses or tenderness, No nipple retraction or dimpling, No nipple discharge or bleeding, No axillary or supraclavicular adenopathy Heart: regular rate and rhythm Abdomen: soft, non-tender; no masses,  no organomegaly Extremities: extremities normal, atraumatic, no cyanosis or edema Skin: Skin color, texture, turgor normal. No rashes or lesions Lymph nodes: Cervical, supraclavicular, and axillary nodes normal. No abnormal inguinal nodes palpated Neurologic: Grossly normal   Pelvic: External genitalia:  no lesions              Urethra:  normal appearing urethra with no masses, tenderness or lesions  Bartholin's and Skene's: normal                 Vagina: normal appearing vagina with normal color and copious white non odorous discharge, no lesions, wet prep taken ph 4.0              Cervix: normal,non tender, no lesions              Pap taken: Yes.   Bimanual Exam:  Uterus:  normal size, contour, position, consistency, mobility, non-tender              Adnexa: normal adnexa and no mass, fullness, tenderness               Rectovaginal: Confirms               Anus:  normal sphincter tone, no lesions  Wet prep: positive for yeast  Chaperone present: Yes  A:  Well Woman with normal  exam  Perimenopausal with cycle change with amenorrhea  Yeast vaginitis  Recent disc surgery    P:   Reviewed health and wellness pertinent to exam  Discussed perimenopausal and menopausal changes and etiology. Discussed bleeding expectations and concerns with prolonged amenorrhea of increase of heavy bleeding and hyperplasia concern. Recommend TSH,FSH,Prolactin screen to assess for amenorrhea.Patient agreeable. Discussed Provera challenge if needed, patient agreeable after labs are reviewed. Menses calendar given with normal and abnormal parameters.  Discussed yeast findings and treatment and occurs with changes in vagina ph or new products.  Rx Diflucan see order with instructions  Pap smear taken today with HPVHR   counseled on breast self exam, mammography screening, adequate intake of calcium and vitamin D, diet and exercise  return annually or prn  An After Visit Summary was printed and given to the patient.

## 2014-03-28 LAB — FOLLICLE STIMULATING HORMONE: FSH: 138.4 m[IU]/mL — ABNORMAL HIGH

## 2014-03-28 LAB — PROLACTIN: Prolactin: 15.1 ng/mL

## 2014-03-28 LAB — TSH: TSH: 0.689 u[IU]/mL (ref 0.350–4.500)

## 2014-03-28 NOTE — Progress Notes (Signed)
Encounter reviewed by Dr. Brook Silva.  

## 2014-03-30 ENCOUNTER — Other Ambulatory Visit: Payer: Self-pay | Admitting: Certified Nurse Midwife

## 2014-03-30 DIAGNOSIS — N951 Menopausal and female climacteric states: Secondary | ICD-10-CM

## 2014-03-30 DIAGNOSIS — N912 Amenorrhea, unspecified: Secondary | ICD-10-CM

## 2014-03-30 MED ORDER — MEDROXYPROGESTERONE ACETATE 10 MG PO TABS: 10.0000 mg | ORAL_TABLET | Freq: Every day | ORAL | Status: DC

## 2014-04-01 LAB — IPS PAP TEST WITH HPV

## 2014-04-15 ENCOUNTER — Telehealth: Payer: Self-pay | Admitting: Certified Nurse Midwife

## 2014-04-15 NOTE — Telephone Encounter (Signed)
Spoke with patient. Advised patient of message as seen below from Regina Eck CNM. Patient is agreeable and verbalizes understanding.  Routing to provider for final review. Patient agreeable to disposition. Will close encounter

## 2014-04-15 NOTE — Telephone Encounter (Signed)
Patient says the prescription given recently is no longer working. Patient last seen 03/27/14.

## 2014-04-15 NOTE — Telephone Encounter (Signed)
Have patient use OTC Clotrimazole vaginal per directions and aveeno sitz bath. If not resolving with use will need OV to assess.

## 2014-04-15 NOTE — Telephone Encounter (Signed)
Spoke with patient. Patient states that she has taken both doses of diflucan that she was prescribed on 2/26. Patient took first tablet on 2/26 and another on 3/2. "I had a little relief when I took the medicine but it never really went away and now it is back to how it was." Patient is experiencing vaginal itching and discharge. Requesting new medication for yeast infection. Advised will speak with Regina Eck CNM regarding continued symptoms and return call. Patient is agreeable.

## 2014-05-07 ENCOUNTER — Telehealth: Payer: Self-pay | Admitting: Certified Nurse Midwife

## 2014-05-07 NOTE — Telephone Encounter (Signed)
Patient calling to follow up with nurse after taking Provera.  Patient also has questions about the antibiotic she is currently taking.

## 2014-05-07 NOTE — Telephone Encounter (Signed)
Spoke with patient. She has completed provera and has not had any bleeding two weeks after completion. Advised patient to keep a calendar of her cycles and to call back if no bleeding in 3 months. Patient verbalized understanding.   Patient states she continues with vaginal irritation despite use of Diflucan and multiple otc remedies and Aveeno baths. States symptoms improve and then return. Patient offered appointment with Regina Eck CNM and scheduled. Routing to provider for final review. Patient agreeable to disposition. Will close encounter

## 2014-05-08 ENCOUNTER — Ambulatory Visit (INDEPENDENT_AMBULATORY_CARE_PROVIDER_SITE_OTHER): Payer: BLUE CROSS/BLUE SHIELD | Admitting: Certified Nurse Midwife

## 2014-05-08 ENCOUNTER — Encounter: Payer: Self-pay | Admitting: Certified Nurse Midwife

## 2014-05-08 VITALS — BP 94/62 | HR 68 | Resp 16 | Ht 67.0 in | Wt 146.0 lb

## 2014-05-08 DIAGNOSIS — N898 Other specified noninflammatory disorders of vagina: Secondary | ICD-10-CM

## 2014-05-08 NOTE — Progress Notes (Signed)
49 y.o.Married white female [id number] here with complaint of vaginal symptoms " just does not feel right" in vaginal area with some itching. Treated for yeast vaginitis 03/27/14 with DIflucan and felt better, not completely resolved. Has used Clotrimazole in the the past few days with some relief. No itching or discharge today other than medication used.    Onset of symptoms 10 days ago. Denies new personal products. Patient just finished Provera for amenorrhea with no withdrawal bleeding. Keeping menses calendar.  no STD concerns. Urinary symptoms none .  No other health issues today.   O:Healthy female WDWN Affect: normal, orientation x 3  Exam: Abdomen:soft,non tender Lymph node: no enlargement or tenderness Pelvic exam: External genital: normal female with slight increase pink noted on labia, no scaling or exudate or lesions BUS: negative Vagina: slight increase pink at entrance to vagina with scant moisture noted.  Medication noted in posterior fornix of vagina.  Affirm taken Cervix: normal, non tender, no CMT Patient deferred pelvic exam    A:Vaginal dryness? Perimenopausal with cycle change keeping menses calendar   P:Discussed findings of changes at entrance to vagina, consist with vaginal dryness and etiology associated with perimenopausal change. Recommended changing to [facility] soap and avoiding excessive wiping with urination, pat instead. Discussed Coconut oil for external /internal moisture to see if this resolves. Await affirm results and will treat as indicated if needed. Patient to call back if no menses in the next 3 months.  Rv prn

## 2014-05-08 NOTE — Patient Instructions (Signed)

## 2014-05-09 LAB — WET PREP BY MOLECULAR PROBE
Candida species: NEGATIVE
Gardnerella vaginalis: POSITIVE — AB
Trichomonas vaginosis: NEGATIVE

## 2014-05-10 ENCOUNTER — Other Ambulatory Visit: Payer: Self-pay | Admitting: Certified Nurse Midwife

## 2014-05-10 DIAGNOSIS — B9689 Other specified bacterial agents as the cause of diseases classified elsewhere: Secondary | ICD-10-CM

## 2014-05-10 DIAGNOSIS — N760 Acute vaginitis: Principal | ICD-10-CM

## 2014-05-10 MED ORDER — CLINDAMYCIN PHOSPHATE 2 % VA CREA: TOPICAL_CREAM | VAGINAL | Status: DC

## 2014-05-17 NOTE — Progress Notes (Signed)
Reviewed personally.  M. Suzanne Starlette Thurow, MD.  

## 2014-06-16 ENCOUNTER — Other Ambulatory Visit: Payer: Self-pay | Admitting: Certified Nurse Midwife

## 2014-06-16 DIAGNOSIS — Z1231 Encounter for screening mammogram for malignant neoplasm of breast: Secondary | ICD-10-CM

## 2014-07-03 ENCOUNTER — Encounter: Payer: Self-pay | Admitting: Certified Nurse Midwife

## 2014-07-03 ENCOUNTER — Ambulatory Visit (INDEPENDENT_AMBULATORY_CARE_PROVIDER_SITE_OTHER): Payer: BLUE CROSS/BLUE SHIELD | Admitting: Certified Nurse Midwife

## 2014-07-03 VITALS — BP 104/62 | HR 68 | Temp 98.2°F | Resp 16 | Ht 67.0 in | Wt 144.0 lb

## 2014-07-03 DIAGNOSIS — N390 Urinary tract infection, site not specified: Secondary | ICD-10-CM | POA: Diagnosis not present

## 2014-07-03 DIAGNOSIS — A499 Bacterial infection, unspecified: Secondary | ICD-10-CM | POA: Diagnosis not present

## 2014-07-03 DIAGNOSIS — N760 Acute vaginitis: Secondary | ICD-10-CM | POA: Diagnosis not present

## 2014-07-03 DIAGNOSIS — B9689 Other specified bacterial agents as the cause of diseases classified elsewhere: Secondary | ICD-10-CM

## 2014-07-03 LAB — POCT URINALYSIS DIPSTICK
Bilirubin, UA: NEGATIVE
Blood, UA: NEGATIVE
Glucose, UA: NEGATIVE
Ketones, UA: NEGATIVE
Nitrite, UA: NEGATIVE
Protein, UA: NEGATIVE
Urobilinogen, UA: NEGATIVE
pH, UA: 5

## 2014-07-03 MED ORDER — HYLAFEM VA SUPP: 1.0000 | Freq: Every day | VAGINAL | Status: DC

## 2014-07-03 NOTE — Patient Instructions (Signed)

## 2014-07-03 NOTE — Progress Notes (Signed)
49 y.o.Married Caucasian female [phone] with a 3 day(s) history of the following:vaginal discharge with slight burning Sexually active: Yes.   Last sexual activity:2days ago. Pt also reports the following associated symptoms: urinary frequency Patient has not tried over the counter treatment. Previous history of BV and yeast vulvitis. Patient has been using coconut oil for vaginal moisture with sexual activity which is helping.  No STD concerns. No dysuria or blood in her urine. Denies back pain. No new personal products. No other health issues today.  O:Healthy female, WDWN Skin: warm and dry CVAT: negative bilateral Abdomen: non tender, neg. suprapubic    Exam:  RVU:YEBXID, Bartholin's, Urethra, Skene's normal     Bladder or urethral meatus non tender                HWY:SHUOHFGBM: yellow and watery                Cx:  normal appearance and non tender,no lesions                Uterus:normal size                Adnexa: normal adnexa and no mass, fullness, tenderness  Wet Prep shows:clue cells  Poct urine-trace  A::Bacterial vaginosis Normal pelvic exam R/O UTI Vaginal dryness   P: Discussed findings of BV and not as progressive in appearance as before.  Rx Hylafem see order with instructions Lab: Urine culture Reviewed warning signs of UTI and need to advise. Discussed daily use of coconut oil to see if this improves the vaginal flora, after treatment. Continue oral probiotic. Questions answered.  Rv prn

## 2014-07-05 LAB — URINE CULTURE: Colony Count: 50000

## 2014-07-06 NOTE — Progress Notes (Signed)
Reviewed personally.  M. Suzanne Labrandon Knoch, MD.  

## 2014-07-13 ENCOUNTER — Encounter: Payer: Self-pay | Admitting: Nurse Practitioner

## 2014-07-13 ENCOUNTER — Ambulatory Visit (INDEPENDENT_AMBULATORY_CARE_PROVIDER_SITE_OTHER): Payer: BLUE CROSS/BLUE SHIELD | Admitting: Nurse Practitioner

## 2014-07-13 VITALS — BP 110/74 | HR 62 | Temp 98.5°F | Resp 18 | Ht 67.0 in | Wt 142.0 lb

## 2014-07-13 DIAGNOSIS — M2550 Pain in unspecified joint: Secondary | ICD-10-CM

## 2014-07-13 LAB — CBC
HCT: 43.7 % (ref 36.0–46.0)
Hemoglobin: 14.5 g/dL (ref 12.0–15.0)
MCHC: 33.2 g/dL (ref 30.0–36.0)
MCV: 85 fl (ref 78.0–100.0)
Platelets: 175 10*3/uL (ref 150.0–400.0)
RBC: 5.14 Mil/uL — ABNORMAL HIGH (ref 3.87–5.11)
RDW: 13.6 % (ref 11.5–15.5)
WBC: 4 10*3/uL (ref 4.0–10.5)

## 2014-07-13 LAB — COMPREHENSIVE METABOLIC PANEL
ALT: 16 U/L (ref 0–35)
AST: 19 U/L (ref 0–37)
Albumin: 4.5 g/dL (ref 3.5–5.2)
Alkaline Phosphatase: 50 U/L (ref 39–117)
BUN: 15 mg/dL (ref 6–23)
CO2: 33 mEq/L — ABNORMAL HIGH (ref 19–32)
Calcium: 9.7 mg/dL (ref 8.4–10.5)
Chloride: 101 mEq/L (ref 96–112)
Creatinine, Ser: 0.78 mg/dL (ref 0.40–1.20)
GFR: 83.6 mL/min (ref >60.00)
Glucose, Bld: 86 mg/dL (ref 70–99)
Potassium: 4.1 mEq/L (ref 3.5–5.1)
Sodium: 139 mEq/L (ref 135–145)
Total Bilirubin: 0.6 mg/dL (ref 0.2–1.2)
Total Protein: 6.8 g/dL (ref 6.0–8.3)

## 2014-07-13 LAB — T4, FREE: Free T4: 0.79 ng/dL (ref 0.60–1.60)

## 2014-07-13 LAB — SEDIMENTATION RATE: Sed Rate: 6 mm/hr (ref 0–22)

## 2014-07-13 LAB — URIC ACID: Uric Acid, Serum: 3.7 mg/dL (ref 2.4–7.0)

## 2014-07-13 LAB — TSH: TSH: 0.77 u[IU]/mL (ref 0.35–4.50)

## 2014-07-13 NOTE — Patient Instructions (Signed)
Take 1000 to 2000 mg curcumin daily.  My office will call with lab results.  Please return in 2 weeks.

## 2014-07-13 NOTE — Progress Notes (Signed)
Pre visit review using our clinic review tool, if applicable. No additional management support is needed unless otherwise documented below in the visit note. 

## 2014-07-13 NOTE — Progress Notes (Signed)
Subjective:     Laura Ritter is a 49 y.o. female c/o polyarthralgia. Onset March 2016: bilat hip pain noted after sitting, gets better with movement & stretches. R is worse than L. Bilat foot pain in morning-bottom of feet, gets better after walking. April, 2016 developed bilat shoulder & hand pain. Shoulders painful to cross arms over body. Stretches do not make worse or better. Last week hands were slightly swollen-couldn't get ring off. Denies fever, fatigue, sores in mouth, rash, diarrhea. Possibly associated: dry eyes-uses systane with some relief. Onset "years". She had work up 2011 w/[facility] for similar symptoms: RA ANA ANCA were Neg. She had back surgery few mos ago for herniated lumbar disc, Dr Trenton Gammon.  The following portions of the patient's history were reviewed and updated as appropriate: allergies, current medications, past medical history, past social history, past surgical history and problem list.  Review of Systems Pertinent items are noted in HPI.    Objective:    BP 110/74 mmHg  Pulse 62  Temp(Src) 98.5 F (36.9 C) (Temporal)  Resp 18  Ht 5\' 7"  (1.702 m)  Wt 142 lb (64.411 kg)  BMI 22.24 kg/m2  SpO2 98% BP 110/74 mmHg  Pulse 62  Temp(Src) 98.5 F (36.9 C) (Temporal)  Resp 18  Ht 5\' 7"  (1.702 m)  Wt 142 lb (64.411 kg)  BMI 22.24 kg/m2  SpO2 98% General appearance: alert, cooperative, appears stated age and no distress Eyes: negative findings: lids and lashes normal, positive findings: conjunctiva: trace injection Throat: lips, mucosa, and tongue normal; teeth and gums normal Skin: Skin color, texture, turgor normal. No rashes or lesions Neurologic: Grossly normal MSK: Hands 1st & 5th dip joints bilat tender on palpation, Pos squeeze test MCP joints bilat, no nail pitting, deformity, wARmth, swelling Feet: Pos squeeze test MTP joint R foot, all dip joints tender bilat, no swelling, warmth or erythema Knees: cool, no deformity erythema or warmth Shoulders:  no tender points, FROM, pos scarf sign bilat Hips: FROM       Assessment: Plan     1. Polyarthralgia DD: psoriatic arthritis, RA, OA - Antinuclear Antibodies, IFA - CBC - Comprehensive metabolic panel - TSH - T4, free - Thyroid peroxidase antibody - Uric acid - Sedimentation rate - Cyclic citrul peptide antibody, IgG; Future - Rheumatoid factor - Hepatitis C antibody - Cyclic citrul peptide antibody, IgG Start 1000 - 2000 mg curcumarin qd Consider xrays hands if all labs nml or ref to rheum  F/u 2 weeks-discuss labs, eval curcumarin

## 2014-07-14 LAB — RHEUMATOID FACTOR: Rhuematoid fact SerPl-aCnc: <10 IU/mL (ref <=14)

## 2014-07-14 LAB — THYROID PEROXIDASE ANTIBODY: Thyroperoxidase Ab SerPl-aCnc: 1 IU/mL (ref <9)

## 2014-07-14 LAB — HEPATITIS C ANTIBODY: HCV Ab: NEGATIVE

## 2014-07-15 LAB — CYCLIC CITRUL PEPTIDE ANTIBODY, IGG: Cyclic Citrullin Peptide Ab: <2 U/mL (ref 0.0–5.0)

## 2014-07-16 ENCOUNTER — Ambulatory Visit (INDEPENDENT_AMBULATORY_CARE_PROVIDER_SITE_OTHER): Payer: BLUE CROSS/BLUE SHIELD

## 2014-07-16 DIAGNOSIS — Z1231 Encounter for screening mammogram for malignant neoplasm of breast: Secondary | ICD-10-CM | POA: Diagnosis not present

## 2014-07-16 LAB — ANTINUCLEAR ANTIBODIES, IFA: ANA Titer 1: NEGATIVE

## 2014-07-17 ENCOUNTER — Telehealth: Payer: Self-pay | Admitting: Nurse Practitioner

## 2014-07-17 NOTE — Telephone Encounter (Signed)
pls call pt: Advise All labs are normal. No indicators for inflammatory arthritis. Pain is likely caused by osteoarthritis. Continue curcumarin as discussed.  We can discuss other options for OA at f/u apt in 2 weeks.

## 2014-07-17 NOTE — Telephone Encounter (Signed)
Patient aware.

## 2014-07-24 ENCOUNTER — Ambulatory Visit (INDEPENDENT_AMBULATORY_CARE_PROVIDER_SITE_OTHER): Payer: BLUE CROSS/BLUE SHIELD | Admitting: Nurse Practitioner

## 2014-07-24 ENCOUNTER — Encounter: Payer: Self-pay | Admitting: Nurse Practitioner

## 2014-07-24 VITALS — BP 104/69 | HR 63 | Temp 97.9°F | Resp 16 | Ht 67.0 in | Wt 143.0 lb

## 2014-07-24 DIAGNOSIS — M150 Primary generalized (osteo)arthritis: Secondary | ICD-10-CM | POA: Diagnosis not present

## 2014-07-24 DIAGNOSIS — M8949 Other hypertrophic osteoarthropathy, multiple sites: Secondary | ICD-10-CM | POA: Insufficient documentation

## 2014-07-24 DIAGNOSIS — M159 Polyosteoarthritis, unspecified: Secondary | ICD-10-CM

## 2014-07-24 HISTORY — DX: Primary generalized (osteo)arthritis: M15.0

## 2014-07-24 NOTE — Progress Notes (Signed)
Pre visit review using our clinic review tool, if applicable. No additional management support is needed unless otherwise documented below in the visit note. 

## 2014-07-24 NOTE — Progress Notes (Signed)
Subjective:    Laura Ritter is an 49 y.o. female who presents for f/u of arthralgias in hands, hips, back. She was seen 2 wks ago. Arthritis w/u was neg for serum markers for SLE, RA, gout. ESR was low. Pain is likely due to oa, although there can be no serum markers for "serum neg RA & spondyloarthropathies. She does report stiffness in am, but it only lasts few minutes.  She is  PT and exercises daily-she may have overuse injury, although she doesn't think so-she varies work out. She reports having lost strength in hands & painful grip. She doesn't like to take meds, but is trying curcumarin 1000 mg qd. She had back surgery for ruptured disc few mos ago & is doing well. All pain resolved. We discussed potential diet triggers & increasing plant foods in diet.  The following portions of the patient's history were reviewed and updated as appropriate: allergies, current medications, past medical history, past social history, past surgical history and problem list.  Review of Systems Constitutional: negative for fatigue and fevers    Objective:    BP 104/69 mmHg  Pulse 63  Temp(Src) 97.9 F (36.6 C) (Oral)  Resp 16  Ht _0  (1.702 m)  Wt 143 lb (64.864 kg)  BMI 22.39 kg/m2  SpO2 94% General appearance: alert, cooperative, appears stated age and no distress Eyes: negative findings: lids and lashes normal and conjunctivae and sclerae normal Neurologic: Gait: Normal Msk: hands: complete grip, but painful. No deformity or obvious synovitis      Assessment:Plan   1. Primary osteoarthritis involving multiple joints HaNDS, hips Serum markers neg, ESR low Discussed NSAIDS, topical NSAIDS, nat anti-inflams F/u PRN

## 2014-07-24 NOTE — Patient Instructions (Signed)
It appears your joint pain is due to osteoarthritis. Pain tends to get worse w/prolonged use of joint, so allow rest periods.  For knee & hand arthritis, you may use topical aspercreme. In addition you may 1000 mg tylenol with 200-600 mg ibuprophen twice daily. Caution: ibuprophen can cause stomach irritation. Natural anti-inflammatories can be used as well: Fresh ginger tea daily (grate 1-2 TBLS fresh ginger in & steep in hot water for 5 minutes); handful tart cherries or 1/2 cup tart cherry juice daily; 2-3 curmarin capsules daily. Start 1 supplement at a time & take for 2 weeks before adding a second or third, if needed.  Let us know if you have concerns. It has been a pleasure to care for your health concerns! Osteoarthritis Osteoarthritis is a disease that causes soreness and swelling (inflammation) of a joint. It occurs when the cartilage at the affected joint wears down. Cartilage acts as a cushion, covering the ends of bones where they meet to form a joint. Osteoarthritis is the most common form of arthritis. It often occurs in older people. The joints affected most often by this condition include those in the:  Ends of the fingers.  Thumbs.  Neck.  Lower back.  Knees.  Hips. CAUSES  Over time, the cartilage that covers the ends of bones begins to wear away. This causes bone to rub on bone, producing pain and stiffness in the affected joints.  RISK FACTORS Certain factors can increase your chances of having osteoarthritis, including:  Older age.  Excessive body weight.  Overuse of joints. SIGNS AND SYMPTOMS   Pain, swelling, and stiffness in the joint.  Over time, the joint may lose its normal shape.  Small deposits of bone (osteophytes) may grow on the edges of the joint.  Bits of bone or cartilage can break off and float inside the joint space. This may cause more pain and damage. DIAGNOSIS  Your health care provider will do a physical exam and ask about your  symptoms. Various tests may be ordered, such as:  X-rays of the affected joint.  An MRI scan.  Blood tests to rule out other types of arthritis.  Joint fluid tests. This involves using a needle to draw fluid from the joint and examining the fluid under a microscope. TREATMENT  Goals of treatment are to control pain and improve joint function. Treatment plans may include:  A prescribed exercise program that allows for rest and joint relief.  A weight control plan.  Pain relief techniques, such as:  Properly applied heat and cold.  Electric pulses delivered to nerve endings under the skin (transcutaneous electrical nerve stimulation, TENS).  Massage.  Certain nutritional supplements.  Medicines to control pain, such as:  Acetaminophen.  Nonsteroidal anti-inflammatory drugs (NSAIDs), such as naproxen.  Narcotic or central-acting agents, such as tramadol.  Corticosteroids. These can be given orally or as an injection.  Surgery to reposition the bones and relieve pain (osteotomy) or to remove loose pieces of bone and cartilage. Joint replacement may be needed in advanced states of osteoarthritis. HOME CARE INSTRUCTIONS   Only take over-the-counter or prescription medicines as directed by your health care provider. Take all medicines exactly as instructed.  Maintain a healthy weight. Follow your health care provider's instructions for weight control. This may include dietary instructions.  Exercise as directed. Your health care provider can recommend specific types of exercise. These may include:  Strengthening exercises These are done to strengthen the muscles that support joints affected by arthritis.  They can be performed with weights or with exercise bands to add resistance.  Aerobic activities These are exercises, such as brisk walking or low-impact aerobics, that get your heart pumping.  Range-of-motion activities These keep your joints limber.  Balance and agility  exercises These help you maintain daily living skills.  Rest your affected joints as directed by your health care provider.  Follow up with your health care provider as directed. SEEK MEDICAL CARE IF:   Your skin turns red.  You develop a rash in addition to your joint pain.  You have worsening joint pain. SEEK IMMEDIATE MEDICAL CARE IF:  You have a significant loss of weight or appetite.  You have a fever along with joint or muscle aches.  You have night sweats. [place] of Arthritis and Musculoskeletal and Skin Diseases: www.niams.SouthExposed.es [employer] on Aging: http://kim-miller.com/ American College of Rheumatology: www.rheumatology.org Document Released: 01/16/2005 Document Revised: 11/06/2012 Document Reviewed: 09/23/2012 [facility] Patient Information 2014 [place], [place].

## 2014-11-08 IMAGING — CR DG HIP COMPLETE 2+V*R*
3 series · 3 of 3 positions shown · non-contrast
Comparison: 02/11/2010

CLINICAL DATA: Right hip pain.  Sciatica.

EXAM:
RIGHT HIP - COMPLETE 2+ VIEW

[view not recorded (1 of 3)]
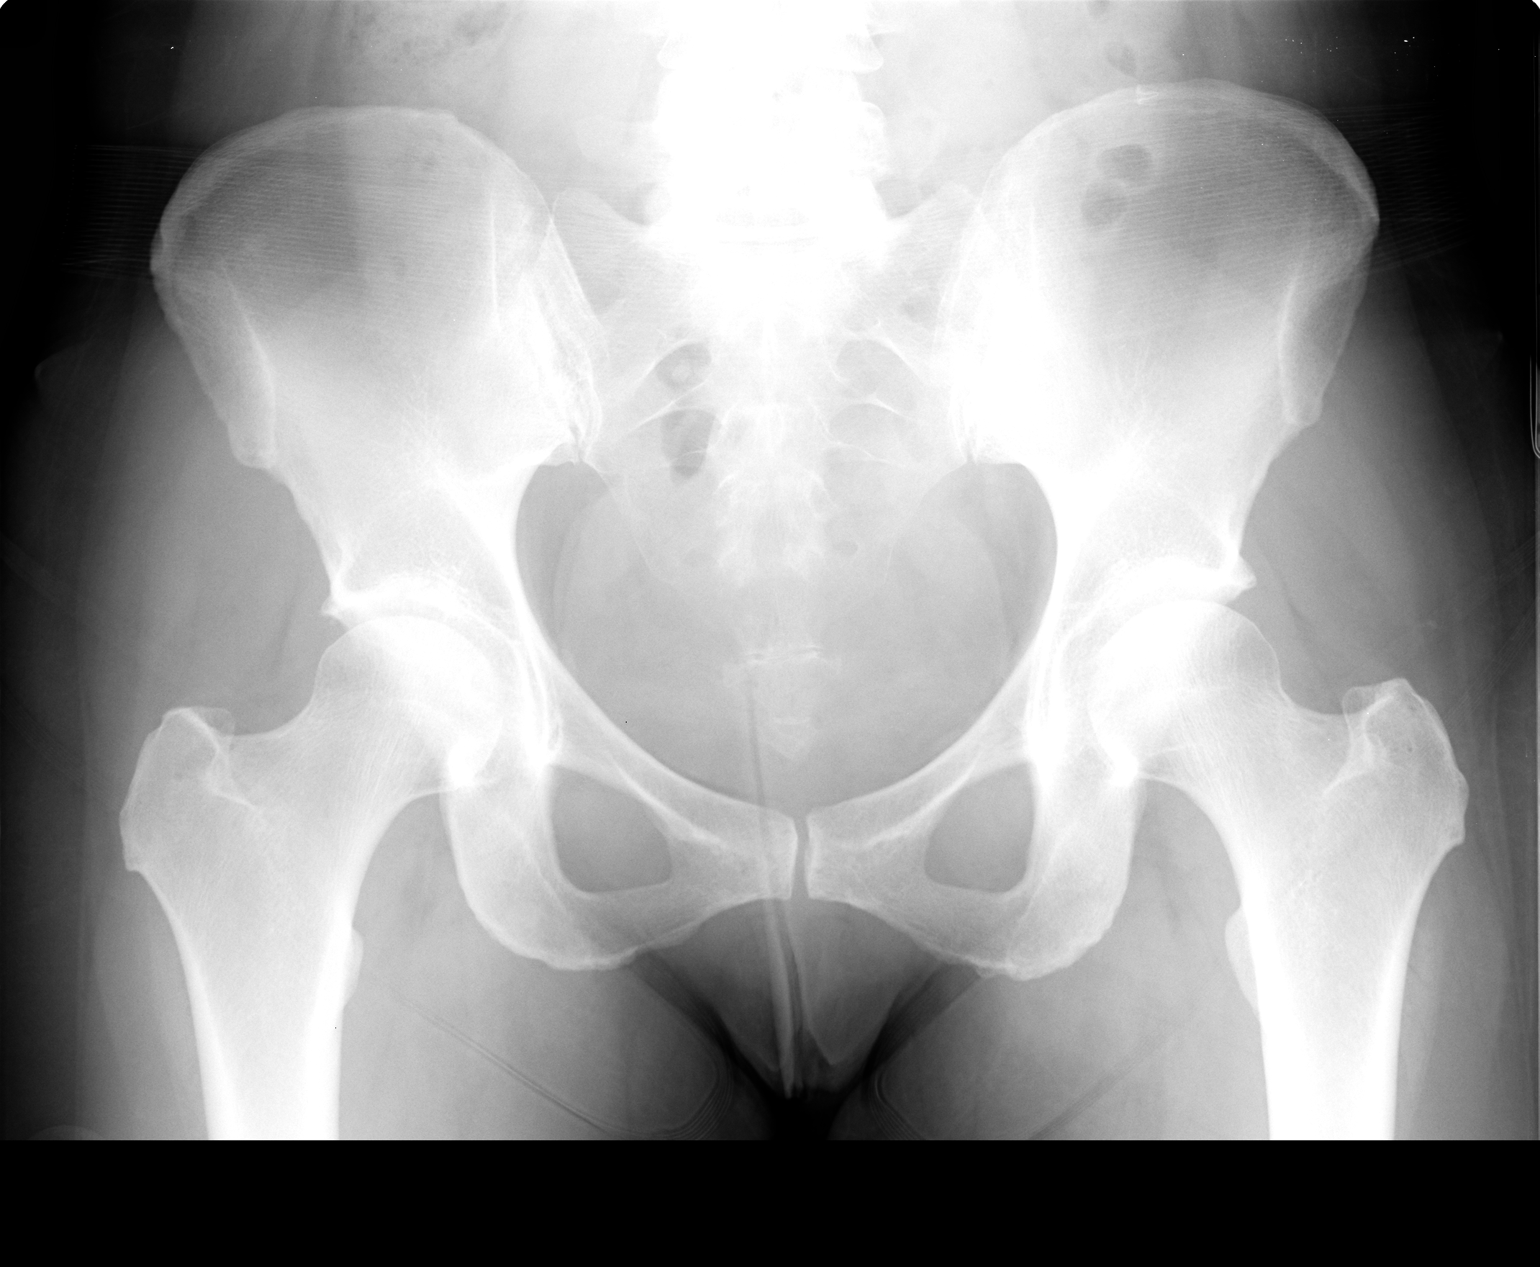

[view not recorded (2 of 3)]
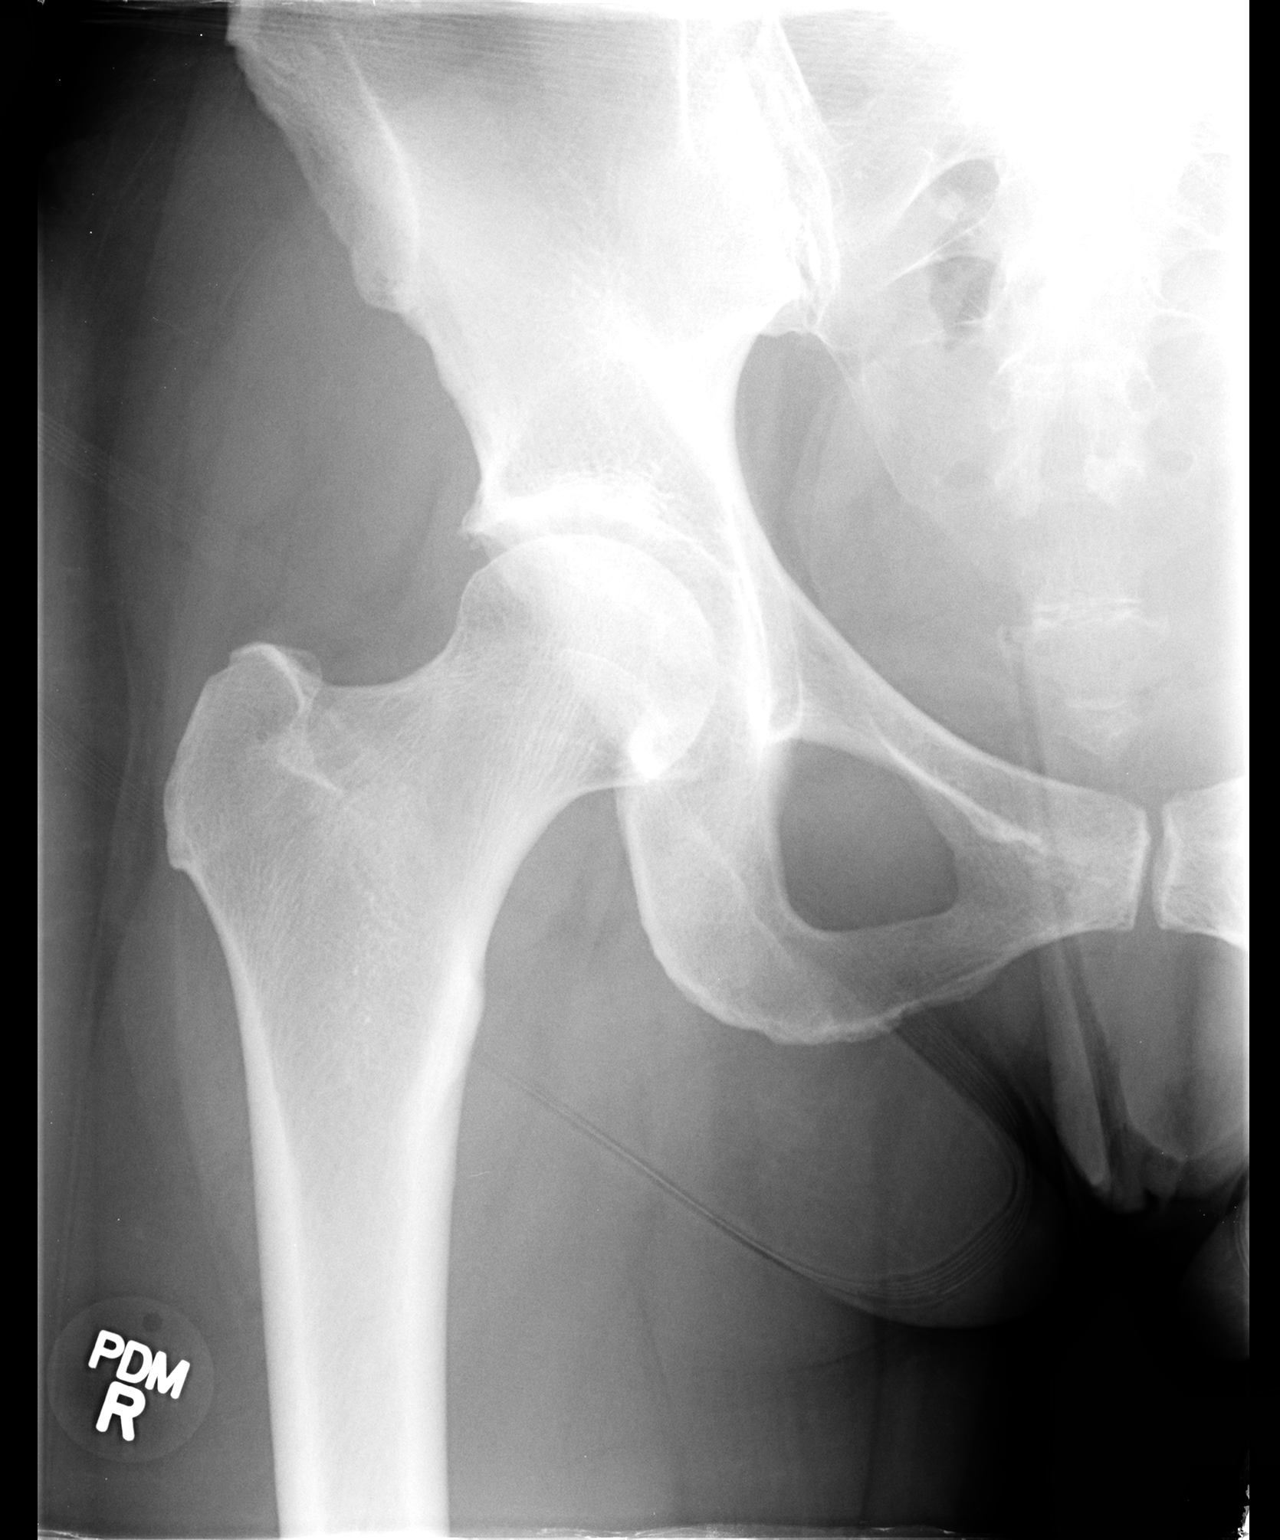

[view not recorded (3 of 3)]
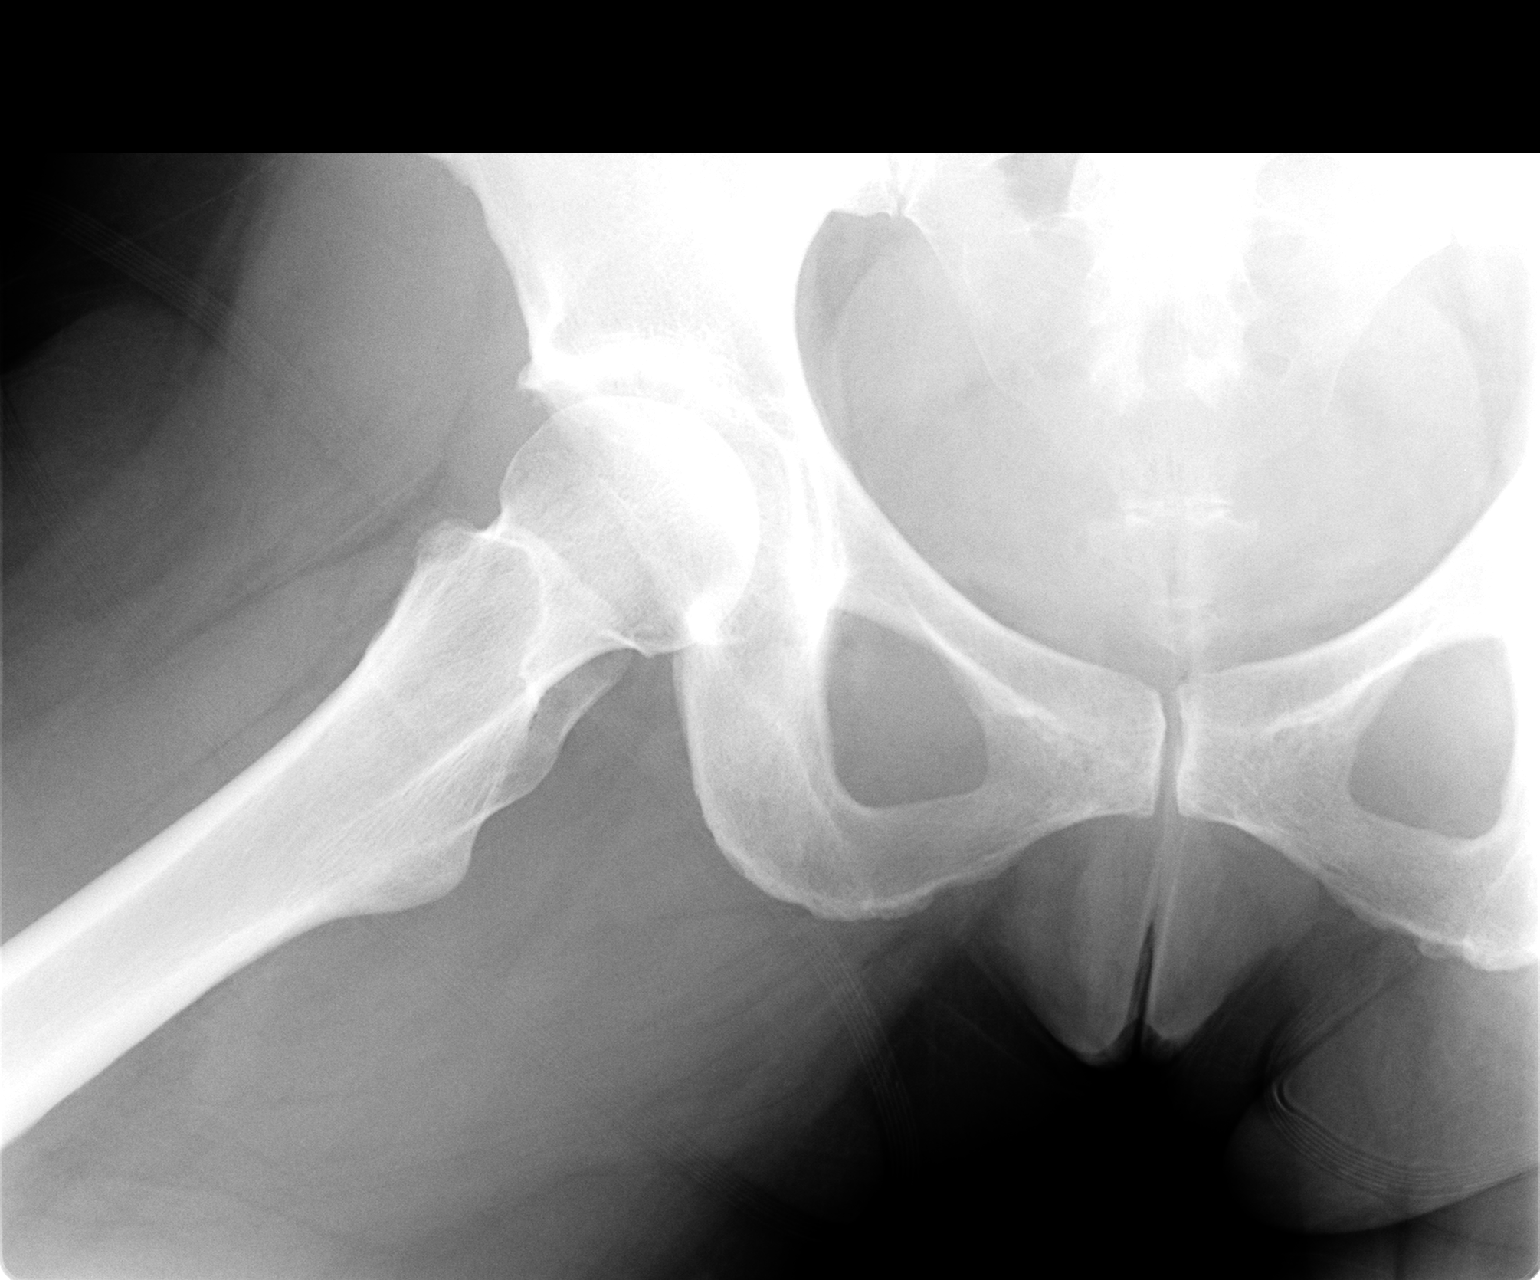

[3 of 3 positions shown; findings below may reference images not displayed]

FINDINGS: The hips appear unremarkable.  No acute bony findings.
IMPRESSION: Negative.

## 2014-11-23 ENCOUNTER — Telehealth: Payer: Self-pay | Admitting: Certified Nurse Midwife

## 2014-11-23 NOTE — Telephone Encounter (Signed)
Patient was last seen on 05/08/2014 with Melvia Heaps CNM. Advised patient she will need to be seen for symptoms for proper treatment. Patient is agreeable and will keep appointment for 10/26.  Routing to provider for final review. Patient agreeable to disposition. Will close encounter.

## 2014-11-23 NOTE — Telephone Encounter (Signed)
Patient called and scheduled an appointment for 11/25/14 for "vaginal dryness and discharge." She wants to speak with the nurse to be sure she should keep that appointment or whether she can get an Rx over the phone.

## 2014-11-25 ENCOUNTER — Encounter: Payer: Self-pay | Admitting: Certified Nurse Midwife

## 2014-11-25 ENCOUNTER — Ambulatory Visit (INDEPENDENT_AMBULATORY_CARE_PROVIDER_SITE_OTHER): Payer: BLUE CROSS/BLUE SHIELD | Admitting: Certified Nurse Midwife

## 2014-11-25 VITALS — BP 108/64 | HR 68 | Resp 16 | Ht 67.0 in | Wt 142.0 lb

## 2014-11-25 DIAGNOSIS — A499 Bacterial infection, unspecified: Secondary | ICD-10-CM | POA: Diagnosis not present

## 2014-11-25 DIAGNOSIS — B9689 Other specified bacterial agents as the cause of diseases classified elsewhere: Secondary | ICD-10-CM

## 2014-11-25 DIAGNOSIS — N760 Acute vaginitis: Secondary | ICD-10-CM | POA: Diagnosis not present

## 2014-11-25 DIAGNOSIS — N898 Other specified noninflammatory disorders of vagina: Secondary | ICD-10-CM

## 2014-11-25 DIAGNOSIS — N9489 Other specified conditions associated with female genital organs and menstrual cycle: Secondary | ICD-10-CM

## 2014-11-25 LAB — WET PREP BY MOLECULAR PROBE
Candida species: NEGATIVE
Gardnerella vaginalis: NEGATIVE
Trichomonas vaginosis: NEGATIVE

## 2014-11-25 MED ORDER — HYLAFEM VA SUPP: 1.0000 | Freq: Every day | VAGINAL | Status: DC

## 2014-11-25 NOTE — Progress Notes (Signed)
49 y.o.Married white female [id number] here with complaint of vaginal symptoms of slight itching,no burning, and increase discharge. Describes discharge as yellow with slight odor. Onset of symptoms 3 days ago. Denies new personal products. Some vaginal dryness, using OTC Silk, working well. No STD concerns. Urinary symptoms none . Contraception is menopausal no HRT. No other health issues today.   O:Healthy female WDWN Affect: normal, orientation x 3  Exam:skin: warm and dry Abdomen:soft, non tender Lymph node: no enlargement or tenderness Pelvic exam: External genital: normal female BUS: negative Vagina: watery odorous discharge noted. Ph:5.0  ,Wet prep taken, Affirm taken Cervix: normal, non tender, no CMT Uterus: normal, non tender Adnexa:normal, non tender, no masses or fullness noted   Wet Prep results: positive for clue  A:Normal pelvic exam BV   P:Discussed findings of BV and etiology. Discussed Aveeno or baking soda sitz bath for comfort. Avoid moist clothes or pads for extended period of time. If working out in gym clothes for long periods of time change underwear  if possible. Olive Oil/Coconut Oil use for skin protection prior to activity can be used to external skin. Rx: Hylafem see order with instructions Will treat per affirm if indicated History of yeast none seen  Rv prn

## 2014-11-25 NOTE — Progress Notes (Signed)
Encounter reviewed Jill Jertson, MD   

## 2014-11-25 NOTE — Patient Instructions (Signed)

## 2014-12-07 ENCOUNTER — Other Ambulatory Visit: Payer: Self-pay | Admitting: Nurse Practitioner

## 2014-12-07 ENCOUNTER — Telehealth: Payer: Self-pay | Admitting: Certified Nurse Midwife

## 2014-12-07 MED ORDER — METRONIDAZOLE 0.75 % VA GEL: 1.0000 | Freq: Every day | VAGINAL | Status: DC

## 2014-12-07 NOTE — Telephone Encounter (Signed)
Yes and RX has been sent to the pharmacy.

## 2014-12-07 NOTE — Telephone Encounter (Signed)
Spoke with patient. Patient was seen on 11/25/2014. Treated for BV with Hylafem. Patient completed 3 day course on 11/28/2014. Patient states she is still experiencing vaginal itching and discharge. "My symptoms are the same as when I came in." Patient is requesting an alternative medication. Advised I will speak with covering provider and return call with additional recommendations. Patient is agreeable.  Kem Boroughs, FNP okay for Metrogel qhs for 5 days?

## 2014-12-07 NOTE — Telephone Encounter (Signed)
Spoke with patient. Advised new rx for Metrogel qhs for 5 days has been sent to the pharmacy on file. Patient is agreeable and verbalizes understanding.  Will close encounter.

## 2014-12-07 NOTE — Telephone Encounter (Signed)
Patient says the rx that Ms. Debbie gave her didn't work she said she needs a alternative. Best contact # for patient [phone]

## 2014-12-07 NOTE — Telephone Encounter (Signed)
Left message to call Brandonn Capelli at [phone]. 

## 2015-02-26 ENCOUNTER — Ambulatory Visit (INDEPENDENT_AMBULATORY_CARE_PROVIDER_SITE_OTHER): Payer: BLUE CROSS/BLUE SHIELD | Admitting: Certified Nurse Midwife

## 2015-02-26 ENCOUNTER — Encounter: Payer: Self-pay | Admitting: Certified Nurse Midwife

## 2015-02-26 VITALS — BP 100/68 | HR 68 | Resp 16 | Ht 67.0 in | Wt 145.0 lb

## 2015-02-26 DIAGNOSIS — N952 Postmenopausal atrophic vaginitis: Secondary | ICD-10-CM | POA: Diagnosis not present

## 2015-02-26 DIAGNOSIS — N898 Other specified noninflammatory disorders of vagina: Secondary | ICD-10-CM | POA: Diagnosis not present

## 2015-02-26 NOTE — Patient Instructions (Signed)

## 2015-02-26 NOTE — Progress Notes (Signed)
50 yr old married caucasian female [id number] here with complaint of vaginal symptoms of itching, burning, no odor for the past week. Describes discharge as watery, but did not see any today. Still using vagisil moisturizer for dryness with some relief. Has tried coconut oil and uses occasionally.Also complaining of pain with sexual activity. No STD concerns  Denies new personal products. Menopausal denies any vaginal bleeding also. No other health issues today.   O:Healthy female WDWN Affect: normal, orientation x 3  Exam: Abdomen:soft, non tender Lymph node: no enlargement or tenderness Pelvic exam: External genital: normal female BUS: negative Vagina: scant to no discharge noted. Atrophic changes noted. Small healing superficial healing laceration noted at fourchette.  Affirm taken Cervix: normal, non tender, no CMT Uterus: normal, non tender Adnexa:normal, non tender, no masses or fullness noted   A:Normal pelvic exam Menopausal atrophic vaginitis R/O vaginal infection   P:Discussed findings of atrophic vaginitis and etiology. Discussed  Possible estrogen use to help restore ph balance and tissue. Discussed risks and benefits, options of cream of tablet. Patient will consider and advise when affirm results reported. Discussed trial to see if relieves the symptoms. Lab: Affirm  Rv prn

## 2015-02-27 LAB — WET PREP BY MOLECULAR PROBE
Candida species: NEGATIVE
Gardnerella vaginalis: POSITIVE — AB
Trichomonas vaginosis: NEGATIVE

## 2015-02-28 ENCOUNTER — Other Ambulatory Visit: Payer: Self-pay | Admitting: Nurse Practitioner

## 2015-02-28 MED ORDER — METRONIDAZOLE 0.75 % VA GEL: 1.0000 | Freq: Every day | VAGINAL | Status: DC

## 2015-02-28 NOTE — Progress Notes (Signed)
Reviewed personally.  M. Suzanne China Deitrick, MD.  

## 2015-03-01 ENCOUNTER — Ambulatory Visit (INDEPENDENT_AMBULATORY_CARE_PROVIDER_SITE_OTHER): Payer: BLUE CROSS/BLUE SHIELD | Admitting: Family Medicine

## 2015-03-01 ENCOUNTER — Encounter: Payer: Self-pay | Admitting: Family Medicine

## 2015-03-01 VITALS — BP 92/60 | HR 62 | Temp 97.6°F | Resp 20 | Wt 144.5 lb

## 2015-03-01 DIAGNOSIS — R002 Palpitations: Secondary | ICD-10-CM

## 2015-03-01 HISTORY — DX: Palpitations: R00.2

## 2015-03-01 NOTE — Progress Notes (Signed)
Patient ID: Laura Ritter, female   DOB: 1965/11/07, 50 y.o.   MRN: [id number]    Laura Ritter, Laura Ritter 03-29-1965, 50 y.o., female MRN: [id number]  CC:   Subjective: Pt presents for an acute OV with complaints of palpitations/flutter feeling in the center of chest, intermittently throughout the day > 10 times a day, about 1 week in duration. Associated symptoms include nothing. She denies dizziness, syncope, chest pain or shortness of breath. Pt feels symptoms are worse with rest. No history of arrhythmia or syncope. 5 years ago similar symptoms with normal ekg in the ED, ekg reviewed an normal, HR 72 bpm. Patient is active with  yoga and walks daily without symptoms.  caffeine use: 1c - 2c of tea, Today nothing and is having flutter. No other stimulant use.  Acute illness: Going through menopause has had multiple yeast infection, using gel.  Anxiety: None Medication change: None OTC supplement change : None EKG: Prior reviewed, normal  TSH: 07/2014 0.77, free T4 0.79, thyroid peroxidase 1  No Known Allergies Social History  Substance Use Topics  . Smoking status: Never Smoker   . Smokeless tobacco: Never Used  . Alcohol Use: 1.8 oz/week    3 Standard drinks or equivalent per week   Past Medical History  Diagnosis Date  . Migraines   . Screening for HIV (human immunodeficiency virus)   . Renal calculi 4/09    followed yearly follow up- Dr. Jeffie Pollock   Past Surgical History  Procedure Laterality Date  . Cesarean section      x3  . Laparoscopic cholecystectomy  2001  . Refractive surgery Bilateral 2006    lasik  . Back surgery  1/16    herniated disc  . Spine surgery     Family History  Problem Relation Age of Onset  . Hyperlipidemia Mother   . Alcohol abuse Mother   . Arthritis Mother   . Alzheimer's disease Mother   . Hypertension Father   . Parkinson's disease Father   . Alcohol abuse Father   . Kidney Stones Brother   . Alcohol abuse Brother   . Migraines Son        Medication List       This list is accurate as of: 03/01/15  3:07 PM.  Always use your most recent med list.               CALCIUM PO  Take by mouth 2 (two) times daily. OTC     metroNIDAZOLE 0.75 % vaginal gel  Commonly known as:  METROGEL  Place 1 Applicatorful vaginally at bedtime.     MULTIVITAMIN PO  Take by mouth.     PROBIOTIC PO  Take by mouth daily.     TURMERIC PO  Take by mouth.     VITAMIN D-3 PO  Take by mouth daily.        ROS: Negative, with the exception of above mentioned in HPI  Objective:  BP 92/60 mmHg  Pulse 62  Temp(Src) 97.6 F (36.4 C)  Resp 20  Wt 144 lb 8 oz (65.545 kg)  SpO2 97%  LMP 01/12/2014 Body mass index is 22.63 kg/(m^2). Gen: Afebrile. No acute distress. Nontoxic in appearance. Caucasian female.  HENT: AT. [place].  MMM, no oral lesions.  Eyes:Pupils Equal Round Reactive to light, Extraocular movements intact,  Conjunctiva without redness, discharge or icterus. Neck/lymp/endocrine: Supple, No  lymphadenopathy, No thyromegaly. CV: RRR, no murmur, clicks, gallops or rubs. No edema, +2/4 posterior  tibialis pulses Chest: CTAB, no wheeze or crackles. Good air movement, normal resp effort.  Abd: Soft. NTND. BS present Skin: no  rashes, purpura or petechiae.  Neuro: Normal gait. PERLA. EOMi. Alert. Oriented x3   Assessment/Plan: Laura Ritter is a 50 y.o. female present for acute OV for palpitations.  Heart palpitations - Not associated with any other neurological or cardiac symptoms.  - Pt is perimenopausal, and flutter feeling could be related to change in hormone levels.  - EKG 12-Lead:SR. ? Drop QRS x1, lead II and III.  QTc 425, consider cardio referral if labs normal. Early repol.  - rule out anemia, TSH or electrolytes cause.  - Discontinue all caffeine and monitior. If worsening would move forward with cardio/event monitor.  - CBC w/Diff; Future - Basic Metabolic Panel (BMET); Future - TSH; Future - T4, free; Future -  pt will be called with results.    Di Jasmer Raoul Pitch, DO  [place]

## 2015-03-01 NOTE — Patient Instructions (Signed)
Palpitations A palpitation is the feeling that your heartbeat is irregular or is faster than normal. It may feel like your heart is fluttering or skipping a beat. Palpitations are usually not a serious problem. However, in some cases, you may need further medical evaluation. CAUSES  Palpitations can be caused by:  Smoking.  Caffeine or other stimulants, such as diet pills or energy drinks.  Alcohol.  Stress and anxiety.  Strenuous physical activity.  Fatigue.  Certain medicines.  Heart disease, especially if you have a history of irregular heart rhythms (arrhythmias), such as atrial fibrillation, atrial flutter, or supraventricular tachycardia.  An improperly working pacemaker or defibrillator. DIAGNOSIS  To find the cause of your palpitations, your health care provider will take your medical history and perform a physical exam. Your health care provider may also have you take a test called an ambulatory electrocardiogram (ECG). An ECG records your heartbeat patterns over a 24-hour period. You may also have other tests, such as:  Transthoracic echocardiogram (TTE). During echocardiography, sound waves are used to evaluate how blood flows through your heart.  Transesophageal echocardiogram (TEE).  Cardiac monitoring. This allows your health care provider to monitor your heart rate and rhythm in real time.  Holter monitor. This is a portable device that records your heartbeat and can help diagnose heart arrhythmias. It allows your health care provider to track your heart activity for several days, if needed.  Stress tests by exercise or by giving medicine that makes the heart beat faster. TREATMENT  Treatment of palpitations depends on the cause of your symptoms and can vary greatly. Most cases of palpitations do not require any treatment other than time, relaxation, and monitoring your symptoms. Other causes, such as atrial fibrillation, atrial flutter, or supraventricular  tachycardia, usually require further treatment. HOME CARE INSTRUCTIONS   Avoid:  Caffeinated coffee, tea, soft drinks, diet pills, and energy drinks.  Chocolate.  Alcohol.  Stop smoking if you smoke.  Reduce your stress and anxiety. Things that can help you relax include:  A method of controlling things in your body, such as your heartbeats, with your mind (biofeedback).  Yoga.  Meditation.  Physical activity such as swimming, jogging, or walking.  Get plenty of rest and sleep. SEEK MEDICAL CARE IF:   You continue to have a fast or irregular heartbeat beyond 24 hours.  Your palpitations occur more often. SEEK IMMEDIATE MEDICAL CARE IF:  You have chest pain or shortness of breath.  You have a severe headache.  You feel dizzy or you faint. MAKE SURE YOU:  Understand these instructions.  Will watch your condition.  Will get help right away if you are not doing well or get worse.   This information is not intended to replace advice given to you by your health care provider. Make sure you discuss any questions you have with your health care provider.   Document Released: 01/14/2000 Document Revised: 01/21/2013 Document Reviewed: 03/17/2011 Elsevier Interactive Patient Education [employer].  I will call you with lab results once available.

## 2015-03-02 ENCOUNTER — Telehealth: Payer: Self-pay | Admitting: Family Medicine

## 2015-03-02 DIAGNOSIS — R7981 Abnormal blood-gas level: Secondary | ICD-10-CM

## 2015-03-02 DIAGNOSIS — R002 Palpitations: Secondary | ICD-10-CM

## 2015-03-02 LAB — CBC WITH DIFFERENTIAL/PLATELET
Basophils Absolute: 0 10*3/uL (ref 0.0–0.1)
Basophils Relative: 0.4 % (ref 0.0–3.0)
Eosinophils Absolute: 0.1 10*3/uL (ref 0.0–0.7)
Eosinophils Relative: 2.4 % (ref 0.0–5.0)
HCT: 46.6 % — ABNORMAL HIGH (ref 36.0–46.0)
Hemoglobin: 15.7 g/dL — ABNORMAL HIGH (ref 12.0–15.0)
Lymphocytes Relative: 31.7 % (ref 12.0–46.0)
Lymphs Abs: 1.1 10*3/uL (ref 0.7–4.0)
MCHC: 33.7 g/dL (ref 30.0–36.0)
MCV: 85.8 fl (ref 78.0–100.0)
Monocytes Absolute: 0.2 10*3/uL (ref 0.1–1.0)
Monocytes Relative: 5.8 % (ref 3.0–12.0)
Neutro Abs: 2.1 10*3/uL (ref 1.4–7.7)
Neutrophils Relative %: 59.7 % (ref 43.0–77.0)
Platelets: 179 10*3/uL (ref 150.0–400.0)
RBC: 5.43 Mil/uL — ABNORMAL HIGH (ref 3.87–5.11)
RDW: 13.2 % (ref 11.5–15.5)
WBC: 3.5 10*3/uL — ABNORMAL LOW (ref 4.0–10.5)

## 2015-03-02 LAB — BASIC METABOLIC PANEL
BUN: 13 mg/dL (ref 6–23)
CO2: 34 mEq/L — ABNORMAL HIGH (ref 19–32)
Calcium: 10.3 mg/dL (ref 8.4–10.5)
Chloride: 100 mEq/L (ref 96–112)
Creatinine, Ser: 0.78 mg/dL (ref 0.40–1.20)
GFR: 83.38 mL/min (ref >60.00)
Glucose, Bld: 73 mg/dL (ref 70–99)
Potassium: 4.3 mEq/L (ref 3.5–5.1)
Sodium: 142 mEq/L (ref 135–145)

## 2015-03-02 LAB — TSH: TSH: 0.74 u[IU]/mL (ref 0.35–4.50)

## 2015-03-02 LAB — T4, FREE: Free T4: 0.81 ng/dL (ref 0.60–1.60)

## 2015-03-02 NOTE — Telephone Encounter (Signed)
Please call pt: - Her labs show a mild elevation in CO2 and Red blood cells.  - this usually is indicative of oxygen/lung disease. I see that she had a history of nodules in the past in her lungs.  - I woud like to obtian a cxr, and maybe CT to look into this further.  - Please have pt obtain cxr (placed order at [facility]--> change location if needed) and follow up 1-2 days after xray to review all results and discuss palpitations/plan.

## 2015-03-03 ENCOUNTER — Ambulatory Visit (HOSPITAL_BASED_OUTPATIENT_CLINIC_OR_DEPARTMENT_OTHER)
Admission: RE | Admit: 2015-03-03 | Discharge: 2015-03-03 | Disposition: A | Discharge status: Home / Self Care | Payer: BLUE CROSS/BLUE SHIELD | Source: Ambulatory Visit | Attending: Family Medicine | Admitting: Family Medicine

## 2015-03-03 DIAGNOSIS — R7981 Abnormal blood-gas level: Secondary | ICD-10-CM | POA: Diagnosis present

## 2015-03-03 DIAGNOSIS — R002 Palpitations: Secondary | ICD-10-CM

## 2015-03-03 NOTE — Telephone Encounter (Signed)
Spoke with patient reviewed lab results and instructions. Patient will go today to get xray.

## 2015-03-05 ENCOUNTER — Encounter: Payer: Self-pay | Admitting: Family Medicine

## 2015-03-05 ENCOUNTER — Ambulatory Visit (INDEPENDENT_AMBULATORY_CARE_PROVIDER_SITE_OTHER): Payer: BLUE CROSS/BLUE SHIELD | Admitting: Family Medicine

## 2015-03-05 VITALS — BP 95/64 | HR 73 | Temp 98.1°F | Resp 20 | Wt 146.0 lb

## 2015-03-05 DIAGNOSIS — R918 Other nonspecific abnormal finding of lung field: Secondary | ICD-10-CM | POA: Diagnosis not present

## 2015-03-05 DIAGNOSIS — R002 Palpitations: Secondary | ICD-10-CM

## 2015-03-05 DIAGNOSIS — R7981 Abnormal blood-gas level: Secondary | ICD-10-CM

## 2015-03-05 NOTE — Patient Instructions (Signed)
I will call with results once they are available. Make appt for Monday to have collected.  I have placed cardiology referral and I will also be placing the CT and echo order. They will call you to schedule.

## 2015-03-05 NOTE — Progress Notes (Signed)
Patient ID: Laura Ritter, female   DOB: 07-31-1965, 50 y.o.   MRN: [id number]    Laura Ritter, Laura Ritter [birth year], 50 y.o., female MRN: [id number]    Subjective:  Palpitations: She returns to the office today by request to review labs and x-ray findings. Briefly patient presented with palpitations that have been increasing in frequency greater than 10 times a day over the last few months. In review of patient's history, she was being followed for pulmonary nodules with groundglass appearance in 2012. At that time, CT chest reading recommended follow-up in 1 year, and patient has not had this completed. It also resulted with a small pleural effusion. In reviewing history patient states approximately 2 months after that is when her palpitations originally started. They then resolved with only intermittent experiences, until recently. Labs collected from last office visit were reviewed today, with noted mildly elevated CO2, RBCs and hemoglobin. Pt states the palpitations have continued since had seen her last. She denies any chest pain, shortness of breath, dyspnea on exertion, dizziness or syncope.  Prior office visit 03/01/2015: Pt presents for an acute OV with complaints of palpitations/flutter feeling in the center of chest, intermittently throughout the day > 10 times a day, about 1 week in duration. Associated symptoms include nothing. She denies dizziness, syncope, chest pain or shortness of breath. Pt feels symptoms are worse with rest. No history of arrhythmia or syncope. 5 years ago similar symptoms with normal ekg in the ED, ekg reviewed an normal, HR 72 bpm. Patient is active with  yoga and walks daily without symptoms.  caffeine use: 1c - 2c of tea, Today nothing and is having flutter. No other stimulant use.  Acute illness: Going through menopause has had multiple yeast infection, using gel.  Anxiety: None Medication change: None OTC supplement change : None EKG: Prior reviewed, normal    TSH: 07/2014 0.77, free T4 0.79, thyroid peroxidase 1  No Known Allergies Social History  Substance Use Topics  . Smoking status: Never Smoker   . Smokeless tobacco: Never Used  . Alcohol Use: 1.8 oz/week    3 Standard drinks or equivalent per week   Past Medical History  Diagnosis Date  . Migraines   . Screening for HIV (human immunodeficiency virus)   . Renal calculi 4/09    followed yearly follow up- Dr. Jeffie Pollock   Past Surgical History  Procedure Laterality Date  . Cesarean section      x3  . Laparoscopic cholecystectomy  2001  . Refractive surgery Bilateral 2006    lasik  . Back surgery  1/16    herniated disc  . Spine surgery     Family History  Problem Relation Age of Onset  . Hyperlipidemia Mother   . Alcohol abuse Mother   . Arthritis Mother   . Alzheimer's disease Mother   . Hypertension Father   . Parkinson's disease Father   . Alcohol abuse Father   . Kidney Stones Brother   . Alcohol abuse Brother   . Migraines Son      Medication List       This list is accurate as of: 03/05/15  2:22 PM.  Always use your most recent med list.               CALCIUM PO  Take by mouth 2 (two) times daily. OTC     metroNIDAZOLE 0.75 % vaginal gel  Commonly known as:  METROGEL  Place 1 Applicatorful vaginally at bedtime.  MULTIVITAMIN PO  Take by mouth.     PROBIOTIC PO  Take by mouth daily.     TURMERIC PO  Take by mouth.     VITAMIN D-3 PO  Take by mouth daily.        ROS: Negative, with the exception of above mentioned in HPI  Objective:  BP 95/64 mmHg  Pulse 73  Temp(Src) 98.1 F (36.7 C)  Resp 20  Wt 146 lb (66.225 kg)  SpO2 100%  LMP 01/12/2014 Body mass index is 22.86 kg/(m^2). Gen: Afebrile. No acute distress. Nontoxic in appearance. Caucasian female.  HENT: AT. [place].  MMM, no oral lesions.  Eyes:Pupils Equal Round Reactive to light, Extraocular movements intact,  Conjunctiva without redness, discharge or  icterus. Neck/lymp/endocrine: Supple, No  lymphadenopathy, No thyromegaly. CV: RRR, no murmur, clicks, gallops or rubs. No edema, +2/4 posterior tibialis pulses Chest: CTAB, no wheeze or crackles. Good air movement, normal resp effort.  Abd: Soft. NTND. BS present Skin: no  rashes, purpura or petechiae.  Neuro: Normal gait. PERLA. EOMi. Alert. Oriented x3   Assessment/Plan: Laura Ritter is a 50 y.o. female present for acute OV for palpitations.  Heart palpitations - Not associated with any other neurological or cardiac symptoms.  - Pt is perimenopausal, and flutter feeling could be related to change in hormone levels.  - EKG 12-Lead:SR. ? Drop QRS x1, lead II and III.  QTc 425, consider cardio referral if labs normal. Early repol.  - CBC result with elevated RBC, hemoglobin and CO2. Otherwise labs were normal including thyroid panel. - Discussed in great detail with patient today her past medical history of pulmonary nodules with groundglass appearance in 2012. There was no repeat CT, at that time she has also had pleural effusion. Her first current some palpitation occurred a few months after that CT. - Considering her history, and detailed discussion, we have decided to go forward with a cardiology referral, with possible Holter monitoring. We'll place an order for echocardiogram today. We'll also move forward with follow-up CT as recommended by her 2012 CT of her chest.  - Have also added labs for collection on Monday. Patient was advised to make certain she is well-hydrated. Will repeat the CBC to make sure accurate reading. Will also add ESR, Ace, LDH.   > 25 minutes spent with patient, >50% of time spent face to face counseling patient and coordinating care.  Llewelyn Sheaffer Raoul Pitch, DO  [place]

## 2015-03-08 ENCOUNTER — Other Ambulatory Visit (INDEPENDENT_AMBULATORY_CARE_PROVIDER_SITE_OTHER): Payer: BLUE CROSS/BLUE SHIELD

## 2015-03-08 DIAGNOSIS — R918 Other nonspecific abnormal finding of lung field: Secondary | ICD-10-CM | POA: Diagnosis not present

## 2015-03-08 DIAGNOSIS — R7981 Abnormal blood-gas level: Secondary | ICD-10-CM

## 2015-03-08 DIAGNOSIS — R002 Palpitations: Secondary | ICD-10-CM

## 2015-03-08 LAB — CBC WITH DIFFERENTIAL/PLATELET
Basophils Absolute: 0 10*3/uL (ref 0.0–0.1)
Basophils Relative: 0 % (ref 0–1)
Eosinophils Absolute: 0.1 10*3/uL (ref 0.0–0.7)
Eosinophils Relative: 2 % (ref 0–5)
HCT: 45 % (ref 36.0–46.0)
Hemoglobin: 15.3 g/dL — ABNORMAL HIGH (ref 12.0–15.0)
Lymphocytes Relative: 22 % (ref 12–46)
Lymphs Abs: 0.9 10*3/uL (ref 0.7–4.0)
MCH: 28.9 pg (ref 26.0–34.0)
MCHC: 34 g/dL (ref 30.0–36.0)
MCV: 84.9 fL (ref 78.0–100.0)
MPV: 9.7 fL (ref 8.6–12.4)
Monocytes Absolute: 0.3 10*3/uL (ref 0.1–1.0)
Monocytes Relative: 7 % (ref 3–12)
Neutro Abs: 2.8 10*3/uL (ref 1.7–7.7)
Neutrophils Relative %: 69 % (ref 43–77)
Platelets: 165 10*3/uL (ref 150–400)
RBC: 5.3 MIL/uL — ABNORMAL HIGH (ref 3.87–5.11)
RDW: 13.2 % (ref 11.5–15.5)
WBC: 4 10*3/uL (ref 4.0–10.5)

## 2015-03-08 LAB — C-REACTIVE PROTEIN: CRP: <0.5 mg/dL (ref <0.60)

## 2015-03-08 LAB — LACTATE DEHYDROGENASE: LDH: 141 U/L (ref 94–250)

## 2015-03-09 ENCOUNTER — Telehealth: Payer: Self-pay | Admitting: Family Medicine

## 2015-03-09 LAB — SEDIMENTATION RATE: Sed Rate: 1 mm/hr (ref 0–20)

## 2015-03-09 LAB — ANGIOTENSIN CONVERTING ENZYME: Angiotensin-Converting Enzyme: 54 U/L — ABNORMAL HIGH (ref 8–52)

## 2015-03-09 NOTE — Telephone Encounter (Signed)
Please call patient: Her labs are consistent with prior collection. Her inflammatory markers are negative, however her angiotensin-converting enzyme is mildly elevated at 54 (this screens for sarcoid), although it is not much change from a few years ago it was 42. -  trying to get her CT approved, they have requested appear to review, this will take some time to coordinate, if we are unable to have her CT approved, I will refer her to pulmonology and they can follow through with her abnormal lab/prior pulmonary nodules. We will call her once we have the final decision.

## 2015-03-10 ENCOUNTER — Ambulatory Visit (HOSPITAL_COMMUNITY)
Admission: RE | Admit: 2015-03-10 | Discharge: 2015-03-10 | Disposition: A | Discharge status: Home / Self Care | Payer: BLUE CROSS/BLUE SHIELD | Source: Ambulatory Visit | Attending: Family Medicine | Admitting: Family Medicine

## 2015-03-10 ENCOUNTER — Telehealth: Payer: Self-pay | Admitting: *Deleted

## 2015-03-10 DIAGNOSIS — R918 Other nonspecific abnormal finding of lung field: Secondary | ICD-10-CM | POA: Diagnosis not present

## 2015-03-10 DIAGNOSIS — I517 Cardiomegaly: Secondary | ICD-10-CM | POA: Diagnosis not present

## 2015-03-10 DIAGNOSIS — I313 Pericardial effusion (noninflammatory): Secondary | ICD-10-CM | POA: Diagnosis not present

## 2015-03-10 DIAGNOSIS — R002 Palpitations: Secondary | ICD-10-CM | POA: Diagnosis not present

## 2015-03-10 DIAGNOSIS — R7981 Abnormal blood-gas level: Secondary | ICD-10-CM | POA: Diagnosis not present

## 2015-03-10 NOTE — Telephone Encounter (Signed)
Spoke with patient reviewed lab results and information. Patient verbalized understanding . 

## 2015-03-10 NOTE — Telephone Encounter (Signed)
Patient aware she will get a call regarding CT scan or Pulmonary referral.

## 2015-03-10 NOTE — Progress Notes (Signed)
  Echocardiogram 2D Echocardiogram has been performed.  Laura Ritter 03/10/2015, 4:08 PM

## 2015-03-11 ENCOUNTER — Telehealth: Payer: Self-pay | Admitting: Family Medicine

## 2015-03-11 NOTE — Telephone Encounter (Signed)
Please call pt: - her echo did not result with normal function (normal EF).  - She did still have that small amount of fluid around the heart.  - I see she has a cardiology appt scheduled. They will review echo and be able to discuss all results in more detail.  - working on CT approval

## 2015-03-11 NOTE — Telephone Encounter (Signed)
Spoke with patient reviewed Echo results. Patient verbalized understanding.

## 2015-03-11 NOTE — Telephone Encounter (Signed)
Left message for patient to return call to review echo results

## 2015-03-12 ENCOUNTER — Telehealth: Payer: Self-pay | Admitting: Certified Nurse Midwife

## 2015-03-12 NOTE — Telephone Encounter (Signed)
Patient was last seen in the office on 02/26/2015 with Melvia Heaps CNM. She was treated with Metrogel for BV. At her appointment use of low dose estrogen to restore vaginal ph and tissue was discussed. The patient has been being evaluated with Dr.Kuneff for heart palpitations. Spoke with patient who states that she had an echocardiogram and now needs to see a cardiologist. "They are not sure what is going on." All notes are available in EPIC. Advised that estrogen products place patients at an increased risk for heart problems. Advised without having a complete work up for problems with her heart start on estrogen medication is not advised. Advised I will review this with Melvia Heaps CNM and return call with any further recommendations. She is agreeable.

## 2015-03-12 NOTE — Telephone Encounter (Signed)
Patient inquiring about starting on a low does estrogen. Patient had a infection and was taking something for that. Patient is off of that and she says she is better. Patient is wanting to know if Ms. Debbie wants patient to wait until her physical to start the low dose estrogen or if she can start now. Best # to reach: [phone] [id number] - [place], [place] [address] [place]  [place] [address] Phone: [phone] Fax: [phone]

## 2015-03-12 NOTE — Telephone Encounter (Signed)
Until she is clear with cardiology on diagnosis,  No estrogen should be used. She can try OTC coconut oil, Olive oil for moisture nightly which will help.

## 2015-03-12 NOTE — Telephone Encounter (Signed)
Spoke with patient. Advised of message as seen below from [place]. She is agreeable and verbalizes understanding.  Routing to provider for final review. Patient agreeable to disposition. Will close encounter.

## 2015-03-16 ENCOUNTER — Telehealth: Payer: Self-pay | Admitting: *Deleted

## 2015-03-16 NOTE — Telephone Encounter (Signed)
Spoke with patient she called to verify insurance company approved her CT scan informed patient Dr Raoul Pitch  Did a peer to peer review to get this approved.

## 2015-03-18 ENCOUNTER — Ambulatory Visit (HOSPITAL_BASED_OUTPATIENT_CLINIC_OR_DEPARTMENT_OTHER)
Admission: RE | Admit: 2015-03-18 | Discharge: 2015-03-18 | Disposition: A | Discharge status: Home / Self Care | Payer: BLUE CROSS/BLUE SHIELD | Source: Ambulatory Visit | Attending: Family Medicine | Admitting: Family Medicine

## 2015-03-18 DIAGNOSIS — R7981 Abnormal blood-gas level: Secondary | ICD-10-CM | POA: Diagnosis not present

## 2015-03-18 DIAGNOSIS — R918 Other nonspecific abnormal finding of lung field: Secondary | ICD-10-CM | POA: Diagnosis present

## 2015-03-18 DIAGNOSIS — R002 Palpitations: Secondary | ICD-10-CM

## 2015-03-19 ENCOUNTER — Telehealth: Payer: Self-pay | Admitting: Family Medicine

## 2015-03-19 ENCOUNTER — Ambulatory Visit (HOSPITAL_BASED_OUTPATIENT_CLINIC_OR_DEPARTMENT_OTHER): Admission: RE | Admit: 2015-03-19 | Payer: BLUE CROSS/BLUE SHIELD | Source: Ambulatory Visit

## 2015-03-19 DIAGNOSIS — R002 Palpitations: Secondary | ICD-10-CM

## 2015-03-19 DIAGNOSIS — R918 Other nonspecific abnormal finding of lung field: Secondary | ICD-10-CM | POA: Insufficient documentation

## 2015-03-19 DIAGNOSIS — R7981 Abnormal blood-gas level: Secondary | ICD-10-CM

## 2015-03-19 DIAGNOSIS — R748 Abnormal levels of other serum enzymes: Secondary | ICD-10-CM

## 2015-03-19 DIAGNOSIS — R9389 Abnormal findings on diagnostic imaging of other specified body structures: Secondary | ICD-10-CM

## 2015-03-19 HISTORY — DX: Other nonspecific abnormal finding of lung field: R91.8

## 2015-03-19 NOTE — Telephone Encounter (Signed)
I have discussed the CT results with patient today. Briefly patient is having palpitations/mild chest discomfort, with mild increase in CO2, hemoglobin and hematocrit outside of normal parameters. She also has had a mildly elevated ACE level, with normal inflammatory markers. She has had known pulmonary nodules, without follow-up since 2012. CT showed some resolving nodules, however she does have new nodules, and nodules that were present prior are mildly larger. Patient has had a echocardiogram with normal ejection fraction. She also have cardiology referral placed to follow-up on palpitations. Discussion with patient today,decided to place a pulmonology referral so that they may continue further evaluation/treatment and continue follow-up on nodules as indicated.

## 2015-03-30 ENCOUNTER — Encounter: Payer: Self-pay | Admitting: Cardiovascular Disease

## 2015-03-30 ENCOUNTER — Ambulatory Visit (INDEPENDENT_AMBULATORY_CARE_PROVIDER_SITE_OTHER): Payer: BLUE CROSS/BLUE SHIELD | Admitting: Cardiovascular Disease

## 2015-03-30 VITALS — BP 100/62 | HR 69 | Ht 67.0 in | Wt 145.5 lb

## 2015-03-30 DIAGNOSIS — I493 Ventricular premature depolarization: Secondary | ICD-10-CM | POA: Diagnosis not present

## 2015-03-30 NOTE — Patient Instructions (Signed)
Dr Oval Linsey recommends that you continue on your current medications as directed. Please refer to the Current Medication list given to you today.  Dr Oval Linsey recommends that you schedule a follow-up appointment in 1 year. You will receive a reminder letter in the mail two months in advance. If you don't receive a letter, please call our office to schedule the follow-up appointment.  If you need a refill on your cardiac medications before your next appointment, please call your pharmacy.

## 2015-03-30 NOTE — Progress Notes (Signed)
Cardiology Office Note   Date:  04/01/2015   ID:  [name], DOB January 07, 1966, MRN [id number]  PCP:  Howard Pouch, DO  Cardiologist:   Sharol Harness, MD   Chief Complaint  Patient presents with  . New Evaluation    Referred by Howard Pouch, DO for heart palpitations, elevated CO2 levels, abnormal CT scan, pulmonary nodules// pt c/o feeling palpitations more often than usual--feels like fluttering, does not feel like her heart is racing anymore; mild chest discomfort during exercise      History of Present Illness: Laura Ritter is a 50 y.o. female with who presents for an evaluation of palpitations.  [name] saw her PCP, Dr. Howard Pouch, on 03/05/15 and reported palpitations that were increasing in frequency.  She started noticing them one month ago.  The palpitations feel like fluttering in her chest.  The frequency varies from none in the day to happening every 10 minutes.  They last for seconds at a time and are not associated with shortness of breath, lightheadedness or dizziness.  There is no associated chest pain.  [name] is very physically active.  She exercises 4 times per week.  She did P90X and other strenuous exercises until she had back surgery last year.  She has not noticed any exertional fatigue or diminished exercise stamina.   She recalls having similar palpitations 5 years ago that stopped on their own.    [name] notes orthopnea but sleeps on one pillow.  She denies lower extremity edema or PND.  [name] was also noted to have pulmonary nodules and ground glass on CT and was referred to Pulmonolgy for further evaluation.  Serum ACE levels were mildly elevated at 54. TSH and free T4 were normal, and she was mildly leukopenic.  Past Medical History  Diagnosis Date  . Migraines   . Screening for HIV (human immunodeficiency virus)   . Renal calculi 4/09    followed yearly follow up- Dr. Jeffie Pollock  . PVC (premature ventricular contraction) 04/01/2015      Past Surgical History  Procedure Laterality Date  . Cesarean section      x3  . Laparoscopic cholecystectomy  2001  . Refractive surgery Bilateral 2006    lasik  . Back surgery  1/16    herniated disc  . Spine surgery       Current Outpatient Prescriptions  Medication Sig Dispense Refill  . CALCIUM PO Take by mouth 2 (two) times daily. OTC    . Cholecalciferol (VITAMIN D-3 PO) Take by mouth daily.    . Multiple Vitamins-Minerals (MULTIVITAMIN PO) Take by mouth.    . Probiotic Product (PROBIOTIC PO) Take by mouth daily.    . TURMERIC PO Take by mouth.     No current facility-administered medications for this visit.    Allergies:   Review of patient's allergies indicates no known allergies.    Social History:  The patient  reports that she has never smoked. She has never used smokeless tobacco. She reports that she drinks about 1.8 oz of alcohol per week. She reports that she does not use illicit drugs.   Family History:  The patient's family history includes Alcohol abuse in her brother, father, and mother; Alzheimer's disease in her mother; Arthritis in her mother; Hyperlipidemia in her mother; Hypertension in her father; Kidney Stones in her brother; Migraines in her son; Parkinson's disease in her father.    ROS:  Please see the history of present  illness.   Otherwise, review of systems are positive for none.   All other systems are reviewed and negative.    PHYSICAL EXAM: VS:  BP 100/62 mmHg  Pulse 69  Ht 5\' 7"  (1.702 m)  Wt 65.998 kg (145 lb 8 oz)  BMI 22.78 kg/m2  LMP 01/12/2014 , BMI Body mass index is 22.78 kg/(m^2). GENERAL:  Well appearing HEENT:  Pupils equal round and reactive, fundi not visualized, oral mucosa unremarkable NECK:  No jugular venous distention, waveform within normal limits, carotid upstroke brisk and symmetric, no bruits, no thyromegaly LYMPHATICS:  No cervical adenopathy LUNGS:  Clear to auscultation bilaterally HEART:  RRR.  PMI not  displaced or sustained,S1 and S2 within normal limits, no S3, no S4, no clicks, no rubs, no murmurs ABD:  Flat, positive bowel sounds normal in frequency in pitch, no bruits, no rebound, no guarding, no midline pulsatile mass, no hepatomegaly, no splenomegaly EXT:  2 plus pulses throughout, no edema, no cyanosis no clubbing SKIN:  No rashes no nodules NEURO:  Cranial nerves II through XII grossly intact, motor grossly intact throughout PSYCH:  Cognitively intact, oriented to person place and time    EKG:  EKG is ordered today. The ekg ordered today demonstrates sinus rhythm rate 69 BPM.  One PVC.    Recent Labs: 07/13/2014: ALT 16 03/01/2015: BUN 13; Creatinine, Ser 0.78; Potassium 4.3; Sodium 142; TSH 0.74 03/08/2015: Hemoglobin 15.3*; Platelets 165    Lipid Panel    Component Value Date/Time   CHOL 191 07/07/2013 1547   TRIG 85.0 07/07/2013 1547   HDL 76.20 07/07/2013 1547   CHOLHDL 3 07/07/2013 1547   VLDL 17.0 07/07/2013 1547   LDLCALC 98 07/07/2013 1547      Wt Readings from Last 3 Encounters:  03/30/15 65.998 kg (145 lb 8 oz)  03/05/15 66.225 kg (146 lb)  03/01/15 65.545 kg (144 lb 8 oz)      ASSESSMENT AND PLAN:  # PVCs: [name] has a PVC today and reports that the sensation she felt when getting her EKG is similar to how she feels at home.  On exam she has very rare ectopy.  Given that her symptoms are similar to what she felt today, it is likely that PVCs are the cause of her symptoms. She has no evidence of heart failure on exam and is able to exert herself without symptoms.  We discussed medication to suppress the PVCs.  However, given her low blood pressure, this is likely to cause more harm than good.  She prefers to not start any medications at this time.  She has already undergone the appropriate lab testing, and her thyroid function, CBC and electrolytes are unremarkable.   Current medicines are reviewed at length with the patient today.  The patient does  not have concerns regarding medicines.  The following changes have been made:  no change  Labs/ tests ordered today include:  No orders of the defined types were placed in this encounter.     Disposition:   FU with Aujanae Mccullum C. Oval Linsey, MD, [facility] in 1 year.    This note was written with the assistance of speech recognition software.  Please excuse any transcriptional errors.  Signed, Cherish Runde C. Oval Linsey, MD, [facility]  04/01/2015 10:16 PM    [place]

## 2015-04-01 ENCOUNTER — Encounter: Payer: Self-pay | Admitting: Cardiovascular Disease

## 2015-04-01 DIAGNOSIS — I493 Ventricular premature depolarization: Secondary | ICD-10-CM | POA: Insufficient documentation

## 2015-04-01 HISTORY — DX: Ventricular premature depolarization: I49.3

## 2015-04-02 ENCOUNTER — Encounter: Payer: Self-pay | Admitting: Certified Nurse Midwife

## 2015-04-02 ENCOUNTER — Ambulatory Visit (INDEPENDENT_AMBULATORY_CARE_PROVIDER_SITE_OTHER): Payer: BLUE CROSS/BLUE SHIELD | Admitting: Certified Nurse Midwife

## 2015-04-02 VITALS — BP 118/64 | HR 72 | Resp 16 | Ht 67.25 in | Wt 145.0 lb

## 2015-04-02 DIAGNOSIS — Z01419 Encounter for gynecological examination (general) (routine) without abnormal findings: Secondary | ICD-10-CM | POA: Diagnosis not present

## 2015-04-02 DIAGNOSIS — N951 Menopausal and female climacteric states: Secondary | ICD-10-CM | POA: Diagnosis not present

## 2015-04-02 NOTE — Progress Notes (Signed)
50 y.o. [id number] Married  Caucasian Fe here for annual exam. Denies vaginal bleeding and vaginal dryness better with [employer] use. Menopausal previous HRT use and had cardiology evaluation which was PVC and was told estrogen use OK. Note in epic, but no notation regarding estrogen. Patient also saw pulmonologist regarding nodules in lungs, she has had since 2010. Had MRI and noted that increase nodules were noted. Still waiting on final plan of care and plans to restart HRT unless total clearance. Busy with family. No other health concerns today. Planning a trip to [place] with girlfriends!  Patient's last menstrual period was 01/12/2014.          Sexually active: Yes.    The current method of family planning is vasectomy.    Exercising: Yes.    cardio & body weight Smoker:  no  Health Maintenance: Pap:  03-10-14 neg HPV HR neg MMG: 07-16-14 category b density,birads 1:neg Colonoscopy:  none BMD:   none TDaP:  up to date per patient Shingles: no Pneumonia: no Hep C and HIV: HIV neg before getting married Labs: none Self breast exam: done occ   reports that she has never smoked. She has never used smokeless tobacco. She reports that she drinks about 1.2 - 1.8 oz of alcohol per week. She reports that she does not use illicit drugs.  Past Medical History  Diagnosis Date  . Migraines   . Screening for HIV (human immunodeficiency virus)   . Renal calculi 4/09    followed yearly follow up- Dr. Jeffie Pollock  . PVC (premature ventricular contraction) 04/01/2015    Past Surgical History  Procedure Laterality Date  . Cesarean section      x3  . Laparoscopic cholecystectomy  2001  . Refractive surgery Bilateral 2006    lasik  . Back surgery  1/16    herniated disc  . Spine surgery      Current Outpatient Prescriptions  Medication Sig Dispense Refill  . CALCIUM PO Take by mouth 2 (two) times daily. OTC    . Cholecalciferol (VITAMIN D-3 PO) Take by mouth daily.    . Multiple Vitamins-Minerals  (MULTIVITAMIN PO) Take by mouth.    . Probiotic Product (PROBIOTIC PO) Take by mouth daily.    . TURMERIC PO Take by mouth.     No current facility-administered medications for this visit.    Family History  Problem Relation Age of Onset  . Hyperlipidemia Mother   . Alcohol abuse Mother   . Arthritis Mother   . Alzheimer's disease Mother   . Hypertension Father   . Parkinson's disease Father   . Alcohol abuse Father   . Kidney Stones Brother   . Alcohol abuse Brother   . Migraines Son     ROS:  Pertinent items are noted in HPI.  Otherwise, a comprehensive ROS was negative.  Exam:   BP 118/64 mmHg  Pulse 68  Resp 16  Ht 5' 7.25" (1.708 m)  Wt 145 lb (65.772 kg)  BMI 22.55 kg/m2  LMP 01/12/2014 Height: 5' 7.25" (170.8 cm) Ht Readings from Last 3 Encounters:  04/02/15 5' 7.25" (1.708 m)  03/30/15 5\' 7"  (1.702 m)  02/26/15 5\' 7"  (1.702 m)    General appearance: alert, cooperative and appears stated age Head: Normocephalic, without obvious abnormality, atraumatic Neck: no adenopathy, supple, symmetrical, trachea midline and thyroid normal to inspection and palpation Lungs: clear to auscultation bilaterally Breasts: normal appearance, no masses or tenderness, No nipple retraction or dimpling, No nipple  discharge or bleeding, No axillary or supraclavicular adenopathy Heart: regular rate and rhythm Abdomen: soft, non-tender; no masses,  no organomegaly Extremities: extremities normal, atraumatic, no cyanosis or edema Skin: Skin color, texture, turgor normal. No rashes or lesions Lymph nodes: Cervical, supraclavicular, and axillary nodes normal. No abnormal inguinal nodes palpated Neurologic: Grossly normal   Pelvic: External genitalia:  no lesions              Urethra:  normal appearing urethra with no masses, tenderness or lesions              Bartholin's and Skene's: normal                 Vagina: normal appearing vagina with normal color and discharge, no lesions               Cervix: normal,non tender, no lesions              Pap taken: No. Bimanual Exam:  Uterus:  normal size, contour, position, consistency, mobility, non-tender              Adnexa: normal adnexa and no mass, fullness, tenderness               Rectovaginal: Confirms               Anus:  normal sphincter tone, no lesions  Chaperone present: yes  A:  Well Woman with normal exam  Menopausal  No HRT  Vaginal dryness improved  Cardiac evaluation for PVC, benign findings, no treatment at this point  Multiple pulmonary nodules under follow up  P:   Reviewed health and wellness pertinent to exam  Aware of need to evaluate if vaginal bleeding. Would not restart HRT until totally cleared with cardiology and pulmonology. Patient agreeable and will consult here first.  Continue use of [place] silk for dryness as needed.  Continue follow up as indicated and haveinformation forwarded if not in epic   Pap smear as above not taken   counseled on breast self exam, mammography screening, use and side effects of HRT, adequate intake of calcium and vitamin D, diet and exercise, Kegel's exercises   return annually or prn  An After Visit Summary was printed and given to the patient.

## 2015-04-02 NOTE — Patient Instructions (Signed)

## 2015-04-05 ENCOUNTER — Telehealth: Payer: Self-pay | Admitting: Cardiovascular Disease

## 2015-04-05 NOTE — Telephone Encounter (Signed)
New Messaage  Pt called states that her gynecologist Regina Eck, CNM with [facility] request that you send a tel -note confirming that it is ok to take estrogen per her heart condition. And please call patient back to discuss.

## 2015-04-05 NOTE — Telephone Encounter (Signed)
Returned call to patient.She stated she needs Dr.[place] to put in writing ok to take estrogen.Stated we can route message to Melvia Heaps NP she is in epic.Message sent to [place].

## 2015-04-07 NOTE — Addendum Note (Signed)
Addended by: Therisa Doyne on: 04/07/2015 03:57 PM   Modules accepted: Orders

## 2015-04-07 NOTE — Progress Notes (Signed)
Encounter reviewed Jill Jertson, MD   

## 2015-04-13 ENCOUNTER — Institutional Professional Consult (permissible substitution): Payer: BLUE CROSS/BLUE SHIELD | Admitting: Internal Medicine

## 2015-04-14 ENCOUNTER — Ambulatory Visit (INDEPENDENT_AMBULATORY_CARE_PROVIDER_SITE_OTHER): Payer: BLUE CROSS/BLUE SHIELD | Admitting: Pulmonary Disease

## 2015-04-14 ENCOUNTER — Encounter: Payer: Self-pay | Admitting: Pulmonary Disease

## 2015-04-14 VITALS — BP 110/74 | HR 61 | Ht 67.0 in | Wt 147.0 lb

## 2015-04-14 DIAGNOSIS — R918 Other nonspecific abnormal finding of lung field: Secondary | ICD-10-CM

## 2015-04-14 NOTE — Progress Notes (Addendum)
Subjective:    Patient ID: Laura Ritter, female    DOB: Mar 06, 1965, 50 y.o.   MRN: [id number]  HPI Consult for abnormal CT scan.  Laura Ritter is a 50 Y/O with PMH of renal stones, migraines. She has H/O multiple pulmonary nodules dating back to 2010. They were first noted in 2010 during a CT of the abd. She has multiple follow CT scans that showed stability and atleast one PET scan that showed mild activity. She was being followed by pulmonay at [facility] and had a workup including immunology, CTD and serologies for fungal diseases. Her last CT scan in feb 2017 shows some pulmonary nodules bigger but others smaller. The largest one on 1 cm in size.    She was seen by cardiology recently for palpitations but no intervention was needed. She was born in [place] but lived in [place] for most of her life. No significant travel history. No family history of lung disease and no symptoms suggestive of autoimmune or CTD. She is a non smoker and is uptodate with her age appropriate cancer screening. 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 No known exposures at work and home.  DATA: CT abd 05/15/08- B/L basal lung nodules CT scan chest 09/11/08- Multiple B/L pulmonary nodule ranging from 5-13 PET scan 09/30/08- Pulmonary nodules with mildly elevated activity. CT scan chest 04/01/09- Persistent pulmonary nodules. PET scan 04/09/09- Negative as per patient. No report available to review CT scan chest 10/14/09- Stable pulmonary nodules CT scan chest 09/22/10- Stable pulmonary nodules CT scan chest 03/18/15- Pulmonary nodules some larger and some smaller compared to 2010. Largest is 10cm  Serology ACE 07/07/13- 52, 2/617- 54 ANA 07/13/14- Neg RA, CCP 07/13/14- Neg  Sed rate 07/13/14- 6, 03/08/15- 1  Social History: Non smoker, drinks alcohol 3times/week. No drug use. Works as a Community education officer.  Family History: Father- alcohol abuse, HTN, parkinsons Mother-  alcohol abuse, hyperlipidemia, alzheimers, HTN Brother- alcohol abuse, kidney stones  Past Medical History  Diagnosis Date  . Migraines   . Screening for HIV (human immunodeficiency virus)   . Renal calculi 4/09    followed yearly follow up- Dr. Jeffie Pollock  . PVC (premature ventricular contraction) 04/01/2015    Current outpatient prescriptions:  .  Calcium Carbonate-Vitamin D (CALCIUM 600+D) 600-400 MG-UNIT tablet, Take 1 tablet by mouth 2 (two) times daily., Disp: , Rfl:  .  Cholecalciferol (VITAMIN D-3 PO), Take 2,000 Units by mouth 2 (two) times daily. , Disp: , Rfl:  .  Multiple Vitamins-Minerals (MULTIVITAMIN PO), Take by mouth., Disp: , Rfl:  .  Probiotic Product (PROBIOTIC PO), Take by mouth daily., Disp: , Rfl:  .  TURMERIC PO, Take 300 mg by mouth 2 (two) times daily. , Disp: , Rfl:   Review of Systems  No cough, sputum,dyspnea, wheezing, hemoptysis No fevers, chills, malaise, loss of weight, appetite No chest pain, palitations No nausea, vomiting, diarrhea, constipation No joint pain, swelling, stiffness, rash All other ROS are negative    Objective:   Physical Exam Blood pressure 110/74, pulse 61, height 5\' 7"  (1.702 m), weight 147 lb (66.679 kg), last menstrual period 01/12/2014, SpO2 100 %. Gen: No apparent distress Neuro: No gross focal deficits. Neck: No JVD, lymphadenopathy, thyromegaly. RS: Clear, No wheeze or crackles CVS: S1-S2 heard, no murmurs rubs gallops. Abdomen: Soft, positive bowel sounds. Extremities: No edema    Assessment & Plan:  Multiple pulmonary nodules.  These have been largely  stable since 2010 with mild uptake in activity on PET scan in 2010. Last CT scan shows a waxing and waning pattern indicative of a inflammatory nature. She had a work up at [place] and recently in 2016 that was negative and sed rate is low. Sarcoid is possible but ACE level is only very mildly elevated   We have discussed repeating the work up with serologies again but  given the relative stability she has declined. She will get the labs from [place] for our review. I will repeat a CT scan later this year. As she is asymptomatic from a pulmonary perspective we have decided to continue with observation. If her nodules are bigger or she develops symptoms then we will consider a VATS biopsy.  She wants to start estrogen replacement therapy and is requesting pulmonary clearance. I do not see any contraindications to doing so.  Plan: - Follow up CT scan in 6 months - Obtain records from [facility].  Marshell Garfinkel MD [facility] Pager [phone] If no answer or after 3pm call: [phone] 04/19/2015, 8:25 AM

## 2015-04-14 NOTE — Patient Instructions (Signed)
We will schedule you for repeat CT of the chest without contrast in 6 months time.  We will follow with you after the CT scan to discuss further workup if needed.

## 2015-04-16 ENCOUNTER — Telehealth: Payer: Self-pay | Admitting: Certified Nurse Midwife

## 2015-04-16 NOTE — Telephone Encounter (Signed)
1. Patient called and said, "I saw my both cardiologist and my pulmonologist (both in [place]) and they gave the okay for me to start estrogen therapy.  They both said they would put it in their notes for Debbi to see."  2. Patient wants to know specifically the name of what medication will be recommended. She is shopping for a new pharmacy and wants to compare prices prior to a prescription being sent in.

## 2015-04-16 NOTE — Telephone Encounter (Signed)
Patient notified that we received her message and that Melvia Heaps CNM is out of the office.  Patient states okay to wait for Melvia Heaps CNM to review as they have discussed possible HRT and notes from providers should have come to Debbi directly.   Advised patient will call back with message when available. Patient agreeable.  Routing to [place], FNP as covering.  Routing to [employer] CNM for review when return to office.

## 2015-04-20 ENCOUNTER — Telehealth: Payer: Self-pay | Admitting: Cardiovascular Disease

## 2015-04-20 NOTE — Telephone Encounter (Signed)
I have a note from pulmonary but not cardiology yet.

## 2015-04-20 NOTE — Telephone Encounter (Signed)
Received a call from [place] with Melvia Heaps CNM regarding patient and if ok for her to start HRT  Will forward to Dr Oval Linsey for review

## 2015-04-20 NOTE — Telephone Encounter (Signed)
Call to Dr. Blenda Mounts office. Spoke with Rip Harbour, Nurse with Dr. Oval Linsey. She will send a message to Dr. Oval Linsey and either call our office back or send staff message.

## 2015-04-20 NOTE — Telephone Encounter (Signed)
Laura Ritter is low risk and can use HRT.

## 2015-04-21 NOTE — Telephone Encounter (Signed)
Sent to [employer] as requested

## 2015-04-22 ENCOUNTER — Other Ambulatory Visit (INDEPENDENT_AMBULATORY_CARE_PROVIDER_SITE_OTHER): Payer: BLUE CROSS/BLUE SHIELD

## 2015-04-22 DIAGNOSIS — R350 Frequency of micturition: Secondary | ICD-10-CM

## 2015-04-22 DIAGNOSIS — R8290 Unspecified abnormal findings in urine: Secondary | ICD-10-CM

## 2015-04-22 LAB — URINALYSIS, ROUTINE W REFLEX MICROSCOPIC
Bilirubin Urine: NEGATIVE
Ketones, ur: NEGATIVE
Nitrite: NEGATIVE
Specific Gravity, Urine: 1.02 (ref 1.000–1.030)
Total Protein, Urine: NEGATIVE
Urine Glucose: NEGATIVE
Urobilinogen, UA: 0.2 (ref 0.0–1.0)
pH: 5.5 (ref 5.0–8.0)

## 2015-04-22 LAB — POC URINALSYSI DIPSTICK (AUTOMATED)
Bilirubin, UA: NEGATIVE
Glucose, UA: NEGATIVE
Ketones, UA: NEGATIVE
Nitrite, UA: NEGATIVE
Protein, UA: NEGATIVE
Spec Grav, UA: 1.025
Urobilinogen, UA: 0.2
pH, UA: 5.5

## 2015-04-22 NOTE — Telephone Encounter (Signed)
Call to patient and she is scheduled for a consult with Melvia Heaps CNM 04/23/15 at 1100. Routing to provider for final review. Patient agreeable to disposition. Will close encounter.

## 2015-04-22 NOTE — Telephone Encounter (Signed)
Debbi,  Message from Dr. Oval Linsey:         Skeet Latch, MD at 04/20/2015 5:05 PM     Status: Signed       Expand All Collapse All   Ms. Hauschild is low risk and can use HRT.

## 2015-04-22 NOTE — Progress Notes (Signed)
Patient dropped off urine sample. Has scheduled appt for urinary frequency in am. Results forwarded to Dr Raoul Pitch for review in am. Culture sent.

## 2015-04-23 ENCOUNTER — Encounter: Payer: Self-pay | Admitting: Certified Nurse Midwife

## 2015-04-23 ENCOUNTER — Ambulatory Visit (INDEPENDENT_AMBULATORY_CARE_PROVIDER_SITE_OTHER): Payer: BLUE CROSS/BLUE SHIELD | Admitting: Certified Nurse Midwife

## 2015-04-23 ENCOUNTER — Ambulatory Visit (INDEPENDENT_AMBULATORY_CARE_PROVIDER_SITE_OTHER): Payer: BLUE CROSS/BLUE SHIELD | Admitting: Family Medicine

## 2015-04-23 ENCOUNTER — Encounter: Payer: Self-pay | Admitting: Family Medicine

## 2015-04-23 VITALS — BP 100/67 | HR 62 | Temp 98.3°F | Resp 20 | Wt 145.0 lb

## 2015-04-23 VITALS — BP 94/60 | HR 68 | Resp 16 | Ht 67.25 in | Wt 147.0 lb

## 2015-04-23 DIAGNOSIS — N952 Postmenopausal atrophic vaginitis: Secondary | ICD-10-CM | POA: Diagnosis not present

## 2015-04-23 DIAGNOSIS — N390 Urinary tract infection, site not specified: Secondary | ICD-10-CM | POA: Diagnosis not present

## 2015-04-23 DIAGNOSIS — R319 Hematuria, unspecified: Secondary | ICD-10-CM

## 2015-04-23 MED ORDER — ESTRADIOL 10 MCG VA TABS: ORAL_TABLET | VAGINAL | Status: DC

## 2015-04-23 MED ORDER — CEPHALEXIN 500 MG PO CAPS: 500.0000 mg | ORAL_CAPSULE | Freq: Four times a day (QID) | ORAL | Status: DC

## 2015-04-23 MED FILL — YUVAFEM 10 MCG VAGINAL INSE: 10 | 28 days supply | Qty: 18 | Fill #0

## 2015-04-23 NOTE — Patient Instructions (Signed)

## 2015-04-23 NOTE — Progress Notes (Signed)
Laura Ritter , [birth year], 50 y.o., female MRN: [id number]  CC: urine frequency Subjective: Pt presents for an acute OV with complaints of urinary frequency of 4 days duration. Associated symptoms include incomplete empty feeling of bladder and mild low back pain discomfort. Pt denies dysuria, fever, chill, nausea, vomit or abdominal pain.Pt has tried cranberry and ibuprophen to ease their symptoms. Patient has never had a UTI in the past. She has a history of kidney stones.  No Known Allergies Social History  Substance Use Topics  . Smoking status: Never Smoker   . Smokeless tobacco: Never Used  . Alcohol Use: 1.2 - 1.8 oz/week    2-3 Standard drinks or equivalent per week     Comment: 3-4 drinks weekly   Past Medical History  Diagnosis Date  . Migraines   . Screening for HIV (human immunodeficiency virus)   . Renal calculi 4/09    followed yearly follow up- Dr. Jeffie Pollock  . PVC (premature ventricular contraction) 04/01/2015   Past Surgical History  Procedure Laterality Date  . Cesarean section      x3  . Laparoscopic cholecystectomy  2001  . Refractive surgery Bilateral 2006    lasik  . Back surgery  1/16    herniated disc  . Spine surgery    . Cholecystectomy  2001   Family History  Problem Relation Age of Onset  . Hyperlipidemia Mother   . Alcohol abuse Mother   . Arthritis Mother   . Alzheimer's disease Mother   . Hypertension Father   . Parkinson's disease Father   . Alcohol abuse Father   . Kidney Stones Brother   . Alcohol abuse Brother   . Migraines Son      Medication List       This list is accurate as of: 04/23/15  8:50 AM.  Always use your most recent med list.               BLACK COHOSH EXTRACT PO  Take by mouth.     CALCIUM 600+D 600-400 MG-UNIT tablet  Generic drug:  Calcium Carbonate-Vitamin D  Take 1 tablet by mouth 2 (two) times daily.     MULTIVITAMIN PO  Take by mouth.     PROBIOTIC PO  Take by mouth daily.     TURMERIC PO    Take 300 mg by mouth 2 (two) times daily.     VITAMIN D-3 PO  Take 2,000 Units by mouth 2 (two) times daily.         ROS: Negative, with the exception of above mentioned in HPI   Objective:  BP 100/67 mmHg  Pulse 62  Temp(Src) 98.3 F (36.8 C)  Resp 20  Wt 145 lb (65.772 kg)  SpO2 97%  LMP 01/12/2014 Body mass index is 22.71 kg/(m^2). Gen: Afebrile. No acute distress. Nontoxic in appearance, well developed,well nourished, pleasant female.  CV: RRR  Chest: CTAB, no wheeze or crackles.  Abd: Soft. flat. ND. Suprapubic TTP. BS present MSK: No  CVA tenderness Skin: No rashes, purpura or petechiae.  Neuro: Normal gait. PERLA. EOMi. Alert. Oriented x3  Assessment/Plan: Laura Ritter is a 50 y.o. female present for acute OV for  1. Urinary tract infection with hematuria, site unspecified - Caution on potential kidney stone discussed, considering mild low back discomfort and history of kidney stones. No CVA tenderness. - red flags discussed and when to seek emergent care.  -  Naproxen, Cranberry/Lemonade, strain urine.  - UA:  trace bld, 11-20 leuks, bacteria 10-50. Urine culture sent.  - cephALEXin (KEFLEX) 500 MG capsule; Take 1 capsule (500 mg total) by mouth 4 (four) times daily.  Dispense: 28 capsule; Refill: 0 - F/U PRN or 1 week if not resolved.   electronically signed by:  Howard Pouch, DO  [place]

## 2015-04-23 NOTE — Progress Notes (Signed)
50 y.o. Married Caucasian female [id number] here to discuss atrophic vaginitis treatment. She has been cleared now with cardiology and pulmonary with no concerns with estrogen use. Patient has been treating vaginal dryness with Olive oil and coconut oil, but has not been working as well. Atrophy noted at last aex on 04/02/15. Patient was having episodes of palpitations and had a cardiac evaluation scheduled after her exam. Was also noted to have nodules in lungs and was see by pulmonary MD. No concerns at this point. So would like some type of estrogen vaginally. No other health concerns today.  O: Healthy WD,WN female Affect: normal, orientation x 3   A:Atrophic vaginitis noted on aex 04/02/15 Cardiology and pulmonary clearance for use( see epic notes)   P: Discussed risks/benefits/side effects and expectations with vaginal dryness.Discussed can be used in cream form, tablet or ring. Would recommend tablet due to low dose to start with. Questions addressed about using coconut oil also with use and discussed can be used without problems.Patient would like to use tablet form to try. Rx Vagifem with instructions to use one tablet at hs for two weeks and then twice weekly vaginally. Report any vaginal bleeding or problems.   RV 4-5 weeks for evaluation   20 minutes spent in consultation regarding vaginal atrophy and treatment with patient.

## 2015-04-23 NOTE — Patient Instructions (Signed)

## 2015-04-24 LAB — URINE CULTURE: Colony Count: 75000

## 2015-04-26 ENCOUNTER — Telehealth: Payer: Self-pay | Admitting: Family Medicine

## 2015-04-26 NOTE — Telephone Encounter (Signed)
Left message with culture results on patient voice mail.

## 2015-04-26 NOTE — Telephone Encounter (Signed)
Please call pt: - Her culture grew significant Grp B strep bacteria.  - the abx prescribed will cover it.

## 2015-04-28 NOTE — Progress Notes (Signed)
Encounter reviewed Shir Bergman, MD   

## 2015-05-19 ENCOUNTER — Encounter: Payer: Self-pay | Admitting: Certified Nurse Midwife

## 2015-05-19 ENCOUNTER — Ambulatory Visit (INDEPENDENT_AMBULATORY_CARE_PROVIDER_SITE_OTHER): Payer: BLUE CROSS/BLUE SHIELD | Admitting: Certified Nurse Midwife

## 2015-05-19 VITALS — BP 94/64 | HR 68 | Resp 16 | Ht 67.25 in | Wt 145.0 lb

## 2015-05-19 DIAGNOSIS — N952 Postmenopausal atrophic vaginitis: Secondary | ICD-10-CM | POA: Diagnosis not present

## 2015-05-19 MED FILL — YUVAFEM 10 MCG VAGINAL INSE: 10 | 28 days supply | Qty: 18 | Fill #1

## 2015-05-19 NOTE — Progress Notes (Signed)
50 y.o. Married Caucasian female [phone] here for follow up of  atrophic vaginitis, treated with Vagifem initiated on 04/23/15. Patient used every hs for 2 weeks and then now using twice weekly as directed. Patient feels so much better, with only complaint of dryness at entrance to vagina. Stopped coconut oil use to see if Vagifem was enough. Denies vaginal bleeding or problems with insertion of Vagifem. No pain with sexual activity now. Would like to continue treatment.   O: Healthy WD,WN female Affect: normal, orientation x 3 Skin:warm and dry Abdomen:soft, non  tender Pelvic exam:EXTERNAL GENITALIA: normal appearing vulva with no masses, tenderness or lesions VAGINA: no abnormal discharge or lesions and melting vagifem tablet present and no pale appearance of vaginal walls noted, non tender. Introital area of vagina slight increase pink only. CERVIX: no lesions or cervical motion tenderness UTERUS: normal,non tender, no masses ADNEXA: no masses palpable, nontender and adnexa normal  A: Atrophic vaginitis responding well to Vagifem use Coconut oil use prn Normal pelvic exam   P: Discussed finding of normal pelvic exam and vaginal area well nourished appearance with moisture present. No contraindications to continued use twice weekly only. Discussed coconut oil use for sexual activity and entrance of vagina as needed.  Aware of risks and benefits and need for annual mammogram. Patient very happy with comfort level now! Rx vagifem see order, patient may do express script and will advise.  RV prn, aex

## 2015-05-27 NOTE — Progress Notes (Signed)
Encounter reviewed Faryn Sieg, MD   

## 2015-07-07 ENCOUNTER — Other Ambulatory Visit: Payer: Self-pay | Admitting: Certified Nurse Midwife

## 2015-07-07 DIAGNOSIS — Z1231 Encounter for screening mammogram for malignant neoplasm of breast: Secondary | ICD-10-CM

## 2015-07-21 ENCOUNTER — Ambulatory Visit: Payer: BLUE CROSS/BLUE SHIELD

## 2015-07-21 ENCOUNTER — Other Ambulatory Visit: Payer: Self-pay

## 2015-07-21 DIAGNOSIS — N952 Postmenopausal atrophic vaginitis: Secondary | ICD-10-CM

## 2015-07-21 NOTE — Telephone Encounter (Signed)
Medication refill request: Estradiol 10MCG Last AEX:  04/02/15 DL Next AEX: 04/05/16 Last MMG (if hormonal medication request): 07/16/14 BIRADS1 negative; patient scheduled for next MMG 07/30/15 Refill authorized: 04/23/15 #18 w/3 refills. Today please advise, pharmacy is requesting 90-day supply with directions and signature to [facility]

## 2015-07-22 MED ORDER — ESTRADIOL 10 MCG VA TABS: ORAL_TABLET | VAGINAL | Status: DC

## 2015-07-27 ENCOUNTER — Encounter: Payer: Self-pay | Admitting: Family Medicine

## 2015-07-27 ENCOUNTER — Ambulatory Visit (INDEPENDENT_AMBULATORY_CARE_PROVIDER_SITE_OTHER): Payer: BLUE CROSS/BLUE SHIELD | Admitting: Family Medicine

## 2015-07-27 VITALS — BP 99/64 | HR 62 | Temp 98.0°F | Resp 20 | Wt 136.8 lb

## 2015-07-27 DIAGNOSIS — R350 Frequency of micturition: Secondary | ICD-10-CM

## 2015-07-27 DIAGNOSIS — R319 Hematuria, unspecified: Secondary | ICD-10-CM

## 2015-07-27 DIAGNOSIS — N390 Urinary tract infection, site not specified: Secondary | ICD-10-CM | POA: Diagnosis not present

## 2015-07-27 LAB — POC URINALSYSI DIPSTICK (AUTOMATED)
Bilirubin, UA: NEGATIVE
Glucose, UA: NEGATIVE
Ketones, UA: NEGATIVE
Nitrite, UA: POSITIVE
Spec Grav, UA: 1.025
Urobilinogen, UA: 0.2
pH, UA: 5.5

## 2015-07-27 MED ORDER — CEPHALEXIN 500 MG PO CAPS: 500.0000 mg | ORAL_CAPSULE | Freq: Four times a day (QID) | ORAL | Status: DC

## 2015-07-27 NOTE — Progress Notes (Signed)
Patient ID: Laura Ritter, female   DOB: [birth year], 50 y.o.   MRN: [id number]    Laura, Ritter 01/08/1966, 50 y.o., female MRN: [id number]  CC: Urinary urgency Subjective: Pt presents for an acute OV with complaints of  of urinary urgency of 2 dyas duration. Associated symptoms include mild lower abdominal pressure. She has tried nothing for the pain. She denies burning with urination, vaginal discharge/irritation,  Nausea, vomit, fever, chills or changes in lower back discomfort. Pt had a UTI, GBS in the recent past, improved with keflex.    Urinalysis    Component Value Date/Time   COLORURINE YELLOW 04/22/2015 1351   APPEARANCEUR CLEAR 04/22/2015 1351   LABSPEC 1.020 04/22/2015 1351   PHURINE 5.5 04/22/2015 1351   GLUCOSEU NEGATIVE 04/22/2015 1351   GLUCOSEU NEGATIVE 05/15/2008 0824   HGBUR TRACE-INTACT* 04/22/2015 1351   BILIRUBINUR NEG 07/27/2015 1134   BILIRUBINUR NEGATIVE 04/22/2015 1351   KETONESUR NEGATIVE 04/22/2015 1351   PROTEINUR 100mg /dl 07/27/2015 1134   PROTEINUR 100* 05/15/2008 0824   UROBILINOGEN 0.2 07/27/2015 1134   UROBILINOGEN 0.2 04/22/2015 1351   NITRITE positive 07/27/2015 1134   NITRITE NEGATIVE 04/22/2015 1351   LEUKOCYTESUR small (1+)* 07/27/2015 1134    No Known Allergies Social History  Substance Use Topics  . Smoking status: Never Smoker   . Smokeless tobacco: Never Used  . Alcohol Use: 0.0 oz/week    0 Standard drinks or equivalent per week     Comment: 3-4 drinks weekly   Past Medical History  Diagnosis Date  . Migraines   . Screening for HIV (human immunodeficiency virus)   . Renal calculi 4/09    followed yearly follow up- Dr. Jeffie Pollock  . PVC (premature ventricular contraction) 04/01/2015   Past Surgical History  Procedure Laterality Date  . Cesarean section      x3  . Laparoscopic cholecystectomy  2001  . Refractive surgery Bilateral 2006    lasik  . Back surgery  1/16    herniated disc  . Spine surgery    . Cholecystectomy   2001   Family History  Problem Relation Age of Onset  . Hyperlipidemia Mother   . Alcohol abuse Mother   . Arthritis Mother   . Alzheimer's disease Mother   . Hypertension Father   . Parkinson's disease Father   . Alcohol abuse Father   . Kidney Stones Brother   . Alcohol abuse Brother   . Migraines Son      Medication List       This list is accurate as of: 07/27/15 11:22 AM.  Always use your most recent med list.               BLACK COHOSH EXTRACT PO  Take by mouth. Reported on 07/27/2015     CALCIUM 600+D 600-400 MG-UNIT tablet  Generic drug:  Calcium Carbonate-Vitamin D  Take 1 tablet by mouth 2 (two) times daily.     Estradiol 10 MCG Tabs vaginal tablet  One tablet twice weekly per vagina     MULTIVITAMIN PO  Take by mouth.     PROBIOTIC PO  Take by mouth daily.     TURMERIC PO  Take 300 mg by mouth 2 (two) times daily.     VITAMIN D-3 PO  Take 2,000 Units by mouth 2 (two) times daily.        ROS: Negative, with the exception of above mentioned in HPI  Objective:  BP 99/64 mmHg  Pulse 62  Temp(Src) 98 F (36.7 C)  Resp 20  Wt 136 lb 12 oz (62.029 kg)  SpO2 97%  LMP 01/12/2014 Body mass index is 21.26 kg/(m^2). Gen: Afebrile. No acute distress. Nontoxic in appearance, well developed, well nourished. Pleasant.  HENT: AT. [place]. MMM, no oral lesions.  Eyes:Pupils Equal Round Reactive to light, Extraocular movements intact,  Conjunctiva without redness, discharge or icterus. CV: RRR  Chest: CTAB, no wheeze or crackles. Abd: Soft.flat. NTND. BS present MSK: No CVA tenderness Neuro: Normal gait. PERLA. EOMi. Alert. Oriented x3    Assessment/Plan: Laura Ritter is a 50 y.o. female present for acute OV for  Urinary frequency/UTI: - POCT Urinalysis Dipstick (Automated): see above - cephALEXin (KEFLEX) 500 MG capsule; Take 1 capsule (500 mg total) by mouth 4 (four) times daily.  Dispense: 28 capsule; Refill: 0 - Urine Culture  > 25 minutes spent  with patient, >50% of time spent face to face counseling patient and coordinating care.   electronically signed by:  Howard Pouch, DO  [place]

## 2015-07-27 NOTE — Patient Instructions (Signed)
It appears you do have an infection.  We will cover with kelfex 4x day.  We will call you with culture results once available

## 2015-07-30 ENCOUNTER — Ambulatory Visit (INDEPENDENT_AMBULATORY_CARE_PROVIDER_SITE_OTHER): Payer: BLUE CROSS/BLUE SHIELD

## 2015-07-30 ENCOUNTER — Telehealth: Payer: Self-pay | Admitting: Family Medicine

## 2015-07-30 DIAGNOSIS — Z1231 Encounter for screening mammogram for malignant neoplasm of breast: Secondary | ICD-10-CM

## 2015-07-30 LAB — URINE CULTURE: Colony Count: >=100000

## 2015-07-30 NOTE — Telephone Encounter (Signed)
Spoke with patient reviewed results. 

## 2015-07-30 NOTE — Telephone Encounter (Signed)
Please call pt: - her urine studies resulted with E.coli UTI, sensitive to medication we prescribed.

## 2015-10-01 ENCOUNTER — Ambulatory Visit (INDEPENDENT_AMBULATORY_CARE_PROVIDER_SITE_OTHER)
Admission: RE | Admit: 2015-10-01 | Discharge: 2015-10-01 | Disposition: A | Discharge status: Home / Self Care | Payer: BLUE CROSS/BLUE SHIELD | Source: Ambulatory Visit | Attending: Pulmonary Disease | Admitting: Pulmonary Disease

## 2015-10-01 DIAGNOSIS — R918 Other nonspecific abnormal finding of lung field: Secondary | ICD-10-CM | POA: Diagnosis not present

## 2015-10-08 ENCOUNTER — Telehealth: Payer: Self-pay | Admitting: Pulmonary Disease

## 2015-10-08 DIAGNOSIS — R911 Solitary pulmonary nodule: Secondary | ICD-10-CM

## 2015-10-08 NOTE — Telephone Encounter (Signed)
Laura Garfinkel, MD  Rinaldo Ratel, CMA        Please let the pt know that the CT shows that the nodules are slightly larger. Recommend getting a PET scan and follow up in clinic after to review and plan for further testing as needed.   ---  I spoke with patient about results and she verbalized understanding and had no questions PET ordered. Please advise PCC's date of PET so we can schedule OV afterwards. thanks

## 2015-10-08 NOTE — Telephone Encounter (Signed)
Patient is scheduled for PET on 10/20/15 @9  am [facility].  Patient is aware of appt & location info & instructions.

## 2015-10-08 NOTE — Telephone Encounter (Signed)
Called spoke with pt. appt scheduled for 9/21 at 9 am with PM.

## 2015-10-20 ENCOUNTER — Encounter (HOSPITAL_COMMUNITY)
Admission: RE | Admit: 2015-10-20 | Discharge: 2015-10-20 | Disposition: A | Discharge status: Home / Self Care | Payer: BLUE CROSS/BLUE SHIELD | Source: Ambulatory Visit | Attending: Pulmonary Disease | Admitting: Pulmonary Disease

## 2015-10-20 DIAGNOSIS — R911 Solitary pulmonary nodule: Secondary | ICD-10-CM | POA: Insufficient documentation

## 2015-10-20 LAB — GLUCOSE, CAPILLARY: Glucose-Capillary: 81 mg/dL (ref 65–99)

## 2015-10-20 MED ORDER — FLUDEOXYGLUCOSE F - 18 (FDG) INJECTION: 6.8700 | Freq: Once | INTRAVENOUS | Status: DC | PRN

## 2015-10-21 ENCOUNTER — Ambulatory Visit (INDEPENDENT_AMBULATORY_CARE_PROVIDER_SITE_OTHER): Payer: BLUE CROSS/BLUE SHIELD | Admitting: Pulmonary Disease

## 2015-10-21 ENCOUNTER — Encounter: Payer: Self-pay | Admitting: Pulmonary Disease

## 2015-10-21 VITALS — BP 102/66 | HR 59 | Ht 67.0 in | Wt 136.6 lb

## 2015-10-21 DIAGNOSIS — R918 Other nonspecific abnormal finding of lung field: Secondary | ICD-10-CM | POA: Diagnosis not present

## 2015-10-21 NOTE — Patient Instructions (Addendum)
We will schedule you for CT without on contrast in 1 year. Follow up in clinic after scan.

## 2015-10-21 NOTE — Progress Notes (Signed)
Laura Ritter    [id number]    [birth year]  Primary Care Physician:Renee Raoul Pitch, DO  Referring Physician: Ma Hillock, DO [address] [place], [place] [address]  Chief complaint:  Follow up for abnormal CT scan.  HPI: Laura Ritter is a 50 Y/O with PMH of renal stones, migraines. She has H/O multiple pulmonary nodules dating back to 2010. They were first noted in 2010 during a CT of the abd. She has multiple follow CT scans that showed stability and atleast one PET scan that showed mild activity. She was being followed by pulmonay at [facility] and had a workup including immunology, CTD and serologies for fungal diseases.    She was seen by cardiology for palpitations but no intervention was needed. She was born in [place] but lived in [place] for most of her life. No significant travel history. No family history of lung disease and no symptoms suggestive of autoimmune or CTD. She is a non smoker and is uptodate with her age appropriate cancer screening. 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 No known exposures at work and home.  Interim History: She feels well with no respiratory complaints. She's had a CT scan which showed very mild enlargement of her nodules. A subsequent PET scan showed evidence low level activity.  Outpatient Encounter Prescriptions as of 10/21/2015  Medication Sig  . BLACK COHOSH EXTRACT PO Take by mouth. Reported on 07/27/2015  . Calcium Carbonate-Vitamin D (CALCIUM 600+D) 600-400 MG-UNIT tablet Take 1 tablet by mouth 2 (two) times daily.  . Cholecalciferol (VITAMIN D-3 PO) Take 2,000 Units by mouth 2 (two) times daily.   . Estradiol 10 MCG TABS vaginal tablet One tablet twice weekly per vagina  . Multiple Vitamins-Minerals (MULTIVITAMIN PO) Take by mouth.  . Probiotic Product (PROBIOTIC PO) Take by mouth daily.  . TURMERIC PO Take 300 mg by mouth 2 (two) times daily.   Marland Kitchen XIIDRA 5 % SOLN Place 1 drop into both  eyes 2 (two) times daily.  . [DISCONTINUED] cephALEXin (KEFLEX) 500 MG capsule Take 1 capsule (500 mg total) by mouth 4 (four) times daily. (Patient not taking: Reported on 10/21/2015)   Facility-Administered Encounter Medications as of 10/21/2015  Medication  . fludeoxyglucose F - 18 (FDG) injection [id number] millicurie    Allergies as of 10/21/2015  . (No Known Allergies)    Past Medical History:  Diagnosis Date  . Migraines   . PVC (premature ventricular contraction) 04/01/2015  . Renal calculi 4/09   followed yearly follow up- Dr. Jeffie Pollock  . Screening for HIV (human immunodeficiency virus)     Past Surgical History:  Procedure Laterality Date  . BACK SURGERY  1/16   herniated disc  . CESAREAN SECTION     x3  . CHOLECYSTECTOMY  2001  . LAPAROSCOPIC CHOLECYSTECTOMY  2001  . REFRACTIVE SURGERY Bilateral 2006   lasik  . SPINE SURGERY      Family History  Problem Relation Age of Onset  . Hyperlipidemia Mother   . Alcohol abuse Mother   . Arthritis Mother   . Alzheimer's disease Mother   . Hypertension Father   . Parkinson's disease Father   . Alcohol abuse Father   . Kidney Stones Brother   . Alcohol abuse Brother   . Migraines Son     Social History   Social History  . Marital status: Married    Spouse name:  N/A  . Number of children: 3  . Years of education: Masters De   Occupational History  . Physical Therapist [employer]   Social History Main Topics  . Smoking status: Never Smoker  . Smokeless tobacco: Never Used  . Alcohol use 0.0 oz/week     Comment: 3-4 drinks weekly  . Drug use: No  . Sexual activity: Yes    Partners: Male     Comment: husband vasectomy   Other Topics Concern  . Not on file   Social History Narrative   Ms. Davern lives with her husband & 3 children. 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 She is from [place].     Review of systems: Review of Systems  Constitutional: Negative for fever  and chills.  HENT: Negative.   Eyes: Negative for blurred vision.  Respiratory: as per HPI  Cardiovascular: Negative for chest pain and palpitations.  Gastrointestinal: Negative for vomiting, diarrhea, blood per rectum. Genitourinary: Negative for dysuria, urgency, frequency and hematuria.  Musculoskeletal: Negative for myalgias, back pain and joint pain.  Skin: Negative for itching and rash.  Neurological: Negative for dizziness, tremors, focal weakness, seizures and loss of consciousness.  Endo/Heme/Allergies: Negative for environmental allergies.  Psychiatric/Behavioral: Negative for depression, suicidal ideas and hallucinations.  All other systems reviewed and are negative.   Physical Exam: Blood pressure 102/66, pulse (!) 59, height 5\' 7"  (1.702 m), weight 62 kg (136 lb 9.6 oz), last menstrual period 01/12/2014, SpO2 100 %. Gen:      No acute distress HEENT:  EOMI, sclera anicteric Neck:     No masses; no thyromegaly Lungs:    Clear to auscultation bilaterally; normal respiratory effort CV:         Regular rate and rhythm; no murmurs Abd:      + bowel sounds; soft, non-tender; no palpable masses, no distension Ext:    No edema; adequate peripheral perfusion Skin:      Warm and dry; no rash Neuro: alert and oriented x 3 Psych: normal mood and affect  Data Reviewed: CT abd 05/15/08- B/L basal lung nodules CT scan chest 09/11/08- Multiple B/L pulmonary nodule ranging from 5-13 PET scan 09/30/08- Pulmonary nodules with mildly elevated activity. CT scan chest 04/01/09- Persistent pulmonary nodules. PET scan 04/09/09- Negative as per patient. No report available to review CT scan chest 10/14/09- Stable pulmonary nodules CT scan chest 09/22/10- Stable pulmonary nodules CT scan chest 03/18/15- Pulmonary nodules some larger and some smaller compared to 2010. Largest is 10cm CT scan chest 10/01/15- Multiple pulmonary nodules. Some with small enlargement. Largest is 11 mm. Images reviewed PET  Scan 10/20/15- Low level uptake. Images reviewed  Serology ACE 07/07/13- 52, 2/617- 54 ANA 07/13/14- Neg RA, CCP 07/13/14- Neg  Sed rate 07/13/14- 6, 03/08/15- 1  Assessment:  Multiple pulmonary nodules. These have been largely stable since 2010 with mild uptake in activity on PET scan in 2010. CT scans shows a waxing and waning pattern indicative of a inflammatory nature. [place] that was negative and sed rate is low. Sarcoid is possible but ACE level is only very mildly elevated   Latest CT scan only shows minor change in size with low level uptake. Given the fact that these have been there since 2010 I favour a benign etiology.  I will follow with repeat CT scan in 1 year.   Plan/Recommendations: - Follow up CT scan in  1 year.  Marshell Garfinkel MD  Pulmonary and Critical Care Pager [phone] 10/21/2015, 9:14 AM  CC: Raoul Pitch, Renee A, DO

## 2016-01-14 ENCOUNTER — Encounter: Payer: Self-pay | Admitting: Family Medicine

## 2016-01-14 ENCOUNTER — Ambulatory Visit (INDEPENDENT_AMBULATORY_CARE_PROVIDER_SITE_OTHER): Payer: BLUE CROSS/BLUE SHIELD | Admitting: Family Medicine

## 2016-01-14 VITALS — BP 106/71 | HR 59 | Temp 97.3°F | Resp 20 | Wt 133.0 lb

## 2016-01-14 DIAGNOSIS — N390 Urinary tract infection, site not specified: Secondary | ICD-10-CM

## 2016-01-14 DIAGNOSIS — R8290 Unspecified abnormal findings in urine: Secondary | ICD-10-CM | POA: Diagnosis not present

## 2016-01-14 DIAGNOSIS — R319 Hematuria, unspecified: Secondary | ICD-10-CM

## 2016-01-14 LAB — POC URINALSYSI DIPSTICK (AUTOMATED)
Bilirubin, UA: NEGATIVE
Glucose, UA: NEGATIVE
Ketones, UA: NEGATIVE
Nitrite, UA: NEGATIVE
Protein, UA: NEGATIVE
Spec Grav, UA: 1.015
Urobilinogen, UA: 0.2
pH, UA: 6.5

## 2016-01-14 MED ORDER — CEPHALEXIN 500 MG PO CAPS: 500.0000 mg | ORAL_CAPSULE | Freq: Four times a day (QID) | ORAL | 0 refills | Status: DC

## 2016-01-14 NOTE — Patient Instructions (Signed)
I have called in keflex every 6 hours for 7 days.  Rest and hydrate.    We will let you know when you culture returns.     Please help [place] help you:  It is a privilege to be able to take care of great patients such as yourself. We are honored you have chosen [facility] for your Primary Care home. Below you will find basic instructions that you may need to access in the future. Please help [place] help you by reading the instructions, which cover many of the frequent questions we experience.   Prescription refills and request:  -In order to allow more efficient response time, please call your pharmacy for all refills. They will forward the request electronically to [place]. This allows for the quickest possible response. Request left on a nurse line can take longer to refill, since these are checked as time allows between office patients and other phone calls.  - refill request can take up to 3-5 working days to complete.  - If request is sent electronically and request is appropiate, it is usually completed in 1-2 business days.  - all patients will need to be seen routinely for all chronic medical conditions requiring prescription medications (see follow-up below). If you are overdue for follow up on your condition, you will be asked to make an appointment and we will call in enough medication to cover you until your appointment (up to 30 days).  - all controlled substances will require a face to face visit to request/refill.  - if you desire your prescriptions to go through a new pharmacy, and have an active script at original pharmacy, you will need to call your pharmacy and have scripts transferred to new pharmacy. This is completed between the pharmacy locations and not by your provider.    Results: If any images or labs were ordered, it can take up to 1 week to get results depending on the test ordered and the lab/facility running and resulting the test. - Normal or stable results, which do  not need further discussion, will be released to your mychart immediately with attached note to you. A call will not be generated for normal results. Please make certain to sign up for mychart. If you have questions on how to activate your mychart you can call the front office.  - If your results need further discussion, our office will attempt to contact you via phone, and if unable to reach you after 2 attempts, we will release your abnormal result to your mychart with instructions.  - All results will be automatically released in mychart after 1 week.  - Your provider will provide you with explanation and instruction on all relevant material in your results. Please keep in mind, results and labs may appear confusing or abnormal to the untrained eye, but it does not mean they are actually abnormal for you personally. If you have any questions about your results that are not covered, or you desire more detailed explanation than what was provided, you should make an appointment with your provider to do so.   Our office handles many outgoing and incoming calls daily. If we have not contacted you within 1 week about your results, please check your mychart to see if there is a message first and if not, then contact our office.  In helping with this matter, you help decrease call volume, and therefore allow [place] to be able to respond to patients needs more efficiently.  Acute office visits (sick visit):  An acute visit is intended for a new problem and are scheduled in shorter time slots to allow schedule openings for patients with new problems. This is the appropriate visit to discuss a new problem. In order to provide you with excellent quality medical care with proper time for you to explain your problem, have an exam and receive treatment with instructions, these appointments should be limited to one new problem per visit. If you experience a new problem, in which you desire to be addressed, please make an  acute office visit, we save openings on the schedule to accommodate you. Please do not save your new problem for any other type of visit, let us take care of it properly and quickly for you.   Follow up visits:  Depending on your condition(s) your provider will need to see you routinely in order to provide you with quality care and prescribe medication(s). Most chronic conditions (Example: hypertension, Diabetes, depression/anxiety... etc), require visits a couple times a year. Your provider will instruct you on proper follow up for your personal medical conditions and history. Please make certain to make follow up appointments for your condition as instructed. Failing to do so could result in lapse in your medication treatment/refills. If you request a refill, and are overdue to be seen on a condition, we will always provide you with a 30 day script (once) to allow you time to schedule.    Medicare wellness (well visit): - we have a wonderful Nurse Maudie Mercury), that will meet with you and provide you will yearly medicare wellness visits. These visits should occur yearly (can not be scheduled less than 1 calendar year apart) and cover preventive health, immunizations, advance directives and screenings you are entitled to yearly through your medicare benefits. Do not miss out on your entitled benefits, this is when medicare will pay for these benefits to be ordered for you.  These are strongly encouraged by your provider and is the appropriate type of visit to make certain you are up to date with all preventive health benefits. If you have not had your medicare wellness exam in the last 12 months, please make certain to schedule one by calling the office and schedule your medicare wellness with Maudie Mercury as soon as possible.   Yearly physical (well visit):  - Adults are recommended to be seen yearly for physicals. Check with your insurance and date of your last physical, most insurances require one calendar year  between physicals. Physicals include all preventive health topics, screenings, medical exam and labs that are appropriate for gender/age and history. You may have fasting labs needed at this visit. This is a well visit (not a sick visit), acute topics should not be covered during this visit.  - Pediatric patients are seen more frequently when they are younger. Your provider will advise you on well child visit timing that is appropriate for your their age. - This is not a medicare wellness visit. Medicare wellness exams do not have an exam portion to the visit. Some medicare companies allow for a physical, some do not allow a yearly physical. If your medicare allows a yearly physical you can schedule the medicare wellness with our nurse Maudie Mercury and have your physical with your provider after, on the same day. Please check with insurance for your full benefits.   Late Policy/No Shows:  - all new patients should arrive 15-30 minutes earlier than appointment to allow [place] time  to  obtain all  personal demographics,  insurance information and for you to complete office paperwork. - All established patients should arrive 10-15 minutes earlier than appointment time to update all information and be checked in .  - In our best efforts to run on time, if you are late for your appointment you will be asked to either reschedule or if able, we will work you back into the schedule. There will be a wait time to work you back in the schedule,  depending on availability.  - If you are unable to make it to your appointment as scheduled, please call 24 hours ahead of time to allow [place] to fill the time slot with someone else who needs to be seen. If you do not cancel your appointment ahead of time, you may be charged a no show fee.

## 2016-01-14 NOTE — Progress Notes (Signed)
Laura Ritter , 03-26-1965, 50 y.o., female MRN: [id number] Patient Care Team    Relationship Specialty Notifications Start End  Ma Hillock, DO PCP - General Family Medicine  03/08/15   Irine Seal, MD Attending Physician Urology  01/14/16   Skeet Latch, MD Attending Physician Cardiology  01/14/16    Comment: palpitations  Marshell Garfinkel, MD Consulting Physician Pulmonary Disease  01/14/16    Comment: nodules  Regina Eck, CNM Referring Physician Certified Nurse Midwife  01/14/16   Lyndal Pulley, DO Attending Physician Family Medicine  01/14/16    Comment: sports med    CC: hematuria  Subjective: Pt presents for an acute OV with complaints of Hematuria of 10 days  duration.  Associated symptoms include nothing. Patient states she was concerned she is going to go back in to have a urinary tract infection. She reports she knows she has a kidney stone, but is not having flank pain. Kidney stone was diagnosed many years ago, patient was told she did not need to follow-up on the condition. She has had multiple UTIs in the past which have cultured GBS and Escherichia coli. She denies fever, chills, flank pain, nausea, dysuria. No Known Allergies Social History  Substance Use Topics  . Smoking status: Never Smoker  . Smokeless tobacco: Never Used  . Alcohol use 0.0 oz/week     Comment: 3-4 drinks weekly   Past Medical History:  Diagnosis Date  . Migraines   . PVC (premature ventricular contraction) 04/01/2015  . Renal calculi 4/09   followed yearly follow up- Dr. Jeffie Pollock  . Screening for HIV (human immunodeficiency virus)    Past Surgical History:  Procedure Laterality Date  . BACK SURGERY  1/16   herniated disc  . CESAREAN SECTION     x3  . CHOLECYSTECTOMY  2001  . LAPAROSCOPIC CHOLECYSTECTOMY  2001  . REFRACTIVE SURGERY Bilateral 2006   lasik  . SPINE SURGERY     Family History  Problem Relation Age of Onset  . Hyperlipidemia Mother   . Alcohol abuse Mother    . Arthritis Mother   . Alzheimer's disease Mother   . Hypertension Father   . Parkinson's disease Father   . Alcohol abuse Father   . Kidney Stones Brother   . Alcohol abuse Brother   . Migraines Son    Allergies as of 01/14/2016   No Known Allergies     Medication List       Accurate as of 01/14/16  6:42 PM. Always use your most recent med list.          BLACK COHOSH EXTRACT PO Take by mouth. Reported on 07/27/2015   CALCIUM 600+D 600-400 MG-UNIT tablet Generic drug:  Calcium Carbonate-Vitamin D Take 1 tablet by mouth 2 (two) times daily.   cephALEXin 500 MG capsule Commonly known as:  KEFLEX Take 1 capsule (500 mg total) by mouth 4 (four) times daily.   Estradiol 10 MCG Tabs vaginal tablet One tablet twice weekly per vagina   MULTIVITAMIN PO Take by mouth.   PROBIOTIC PO Take by mouth daily.   TURMERIC PO Take 300 mg by mouth 2 (two) times daily.   VITAMIN D-3 PO Take 2,000 Units by mouth 2 (two) times daily.   XIIDRA 5 % Soln Generic drug:  Lifitegrast Place 1 drop into both eyes 2 (two) times daily.       Results for orders placed or performed in visit on 01/14/16 (from the past  24 hour(s))  POCT Urinalysis Dipstick (Automated)     Status: Abnormal   Collection Time: 01/14/16  1:47 PM  Result Value Ref Range   Color, UA yellow    Clarity, UA clear    Glucose, UA negative    Bilirubin, UA negative    Ketones, UA negative    Spec Grav, UA 1.015    Blood, UA moderate    pH, UA 6.5    Protein, UA negative    Urobilinogen, UA 0.2    Nitrite, UA negative    Leukocytes, UA small (1+) (A) Negative   No results found.   ROS: Negative, with the exception of above mentioned in HPI   Objective:  BP 106/71 (BP Location: Left Arm, Patient Position: Sitting, Cuff Size: Normal)   Pulse (!) 59   Temp 97.3 F (36.3 C)   Resp 20   Wt 133 lb (60.3 kg)   LMP 01/12/2014 (LMP Unknown)   SpO2 98%   BMI 20.83 kg/m  Body mass index is 20.83  kg/m. Gen: Afebrile. No acute distress. Nontoxic in appearance, well developed, well nourished.  HENT: AT. [place].  MMM, no oral lesions.  Eyes:Pupils Equal Round Reactive to light, Extraocular movements intact,  Conjunctiva without redness, discharge or icterus. Abd: Soft. NTND. BS Present.  MSK: No CVA tenderness bilaterally Neuro: Normal gait. PERLA. EOMi. Alert. Oriented x3    Results for orders placed or performed in visit on 01/14/16 (from the past 24 hour(s))  POCT Urinalysis Dipstick (Automated)     Status: Abnormal   Collection Time: 01/14/16  1:47 PM  Result Value Ref Range   Color, UA yellow    Clarity, UA clear    Glucose, UA negative    Bilirubin, UA negative    Ketones, UA negative    Spec Grav, UA 1.015    Blood, UA moderate    pH, UA 6.5    Protein, UA negative    Urobilinogen, UA 0.2    Nitrite, UA negative    Leukocytes, UA small (1+) (A) Negative     Assessment/Plan: Laura Ritter is a 50 y.o. female present for acute OV for  Hematuria, unspecified type/abnormal urine finding Urinary tract infection with hematuria, site unspecified - POCT Urinalysis Dipstick (Automated) - Urine Culture - cephALEXin (KEFLEX) 500 MG capsule; Take 1 capsule (500 mg total) by mouth 4 (four) times daily.  Dispense: 28 capsule; Refill: 0 - Discussed with patient at this time I'm uncertain if this is secondary to cystitis, kidney stone, other pathology or urinary tract infection. Will send urine for culture, treat as if infection. If noninfectious, will encourage repeat testing urine in 4 weeks to ensure hematuria has resolved. If it has not resolved would encourage urology referral for further evaluation.   electronically signed by:  Howard Pouch, DO  [place]

## 2016-01-16 LAB — URINE CULTURE

## 2016-01-17 ENCOUNTER — Telehealth: Payer: Self-pay | Admitting: Family Medicine

## 2016-01-17 NOTE — Telephone Encounter (Signed)
Left message on voicemail for patient to return call. 

## 2016-01-17 NOTE — Telephone Encounter (Signed)
Please call pt: - her urine culture did not grow a certain bacteria, therefore her discomfort/hematuria is not from an infection. At this time I would recommend she continue abx until completed, and I would like her to have a lab appt for a urine in 2-4 weeks for retest to make sure her urine clears.  I also seen tumeric was on her list of meds, this can be a bladder irritant and sometimes cause hematuria as well.  If she has pain or worsening condition I would need to see her with a provider appt sooner.

## 2016-01-18 NOTE — Telephone Encounter (Signed)
Spoke with patient reviewed results and instructions with patient . Patient states she is already seeing an improvement in her symptoms. Patient will call back to schedule lab appt for urine recheck

## 2016-02-07 ENCOUNTER — Encounter: Payer: Self-pay | Admitting: Family Medicine

## 2016-02-07 ENCOUNTER — Ambulatory Visit (INDEPENDENT_AMBULATORY_CARE_PROVIDER_SITE_OTHER): Payer: BLUE CROSS/BLUE SHIELD | Admitting: Family Medicine

## 2016-02-07 VITALS — BP 104/69 | HR 60 | Temp 98.3°F | Resp 20 | Wt 139.0 lb

## 2016-02-07 DIAGNOSIS — R319 Hematuria, unspecified: Secondary | ICD-10-CM | POA: Diagnosis not present

## 2016-02-07 DIAGNOSIS — W57XXXA Bitten or stung by nonvenomous insect and other nonvenomous arthropods, initial encounter: Secondary | ICD-10-CM | POA: Diagnosis not present

## 2016-02-07 LAB — URINALYSIS, ROUTINE W REFLEX MICROSCOPIC
Bilirubin Urine: NEGATIVE
Glucose, UA: NEGATIVE
Ketones, ur: NEGATIVE
Nitrite: NEGATIVE
Specific Gravity, Urine: 1.019 (ref 1.001–1.035)
pH: 6 (ref 5.0–8.0)

## 2016-02-07 LAB — URINALYSIS, MICROSCOPIC ONLY
Bacteria, UA: NONE SEEN [HPF]
Casts: NONE SEEN [LPF]
Yeast: NONE SEEN [HPF]

## 2016-02-07 NOTE — Patient Instructions (Addendum)
Use antihistamine and/or steroid creme for itchiness.  Monitor for redness, swelling or pus-like drainage. If appears to become infected call in, but appears to be ok currently.   We will call you with your repeat urine results.    Please help [place] help you:  It is a privilege to be able to take care of great patients such as yourself. We are honored you have chosen [facility] for your Primary Care home. Below you will find basic instructions that you may need to access in the future. Please help [place] help you by reading the instructions, which cover many of the frequent questions we experience.   Prescription refills and request:  -In order to allow more efficient response time, please call your pharmacy for all refills. They will forward the request electronically to [place]. This allows for the quickest possible response. Request left on a nurse line can take longer to refill, since these are checked as time allows between office patients and other phone calls.  - refill request can take up to 3-5 working days to complete.  - If request is sent electronically and request is appropiate, it is usually completed in 1-2 business days.  - all patients will need to be seen routinely for all chronic medical conditions requiring prescription medications (see follow-up below). If you are overdue for follow up on your condition, you will be asked to make an appointment and we will call in enough medication to cover you until your appointment (up to 30 days).  - all controlled substances will require a face to face visit to request/refill.  - if you desire your prescriptions to go through a new pharmacy, and have an active script at original pharmacy, you will need to call your pharmacy and have scripts transferred to new pharmacy. This is completed between the pharmacy locations and not by your provider.    Results: If any images or labs were ordered, it can take up to 1 week to get results depending on the  test ordered and the lab/facility running and resulting the test. - Normal or stable results, which do not need further discussion, will be released to your mychart immediately with attached note to you. A call will not be generated for normal results. Please make certain to sign up for mychart. If you have questions on how to activate your mychart you can call the front office.  - If your results need further discussion, our office will attempt to contact you via phone, and if unable to reach you after 2 attempts, we will release your abnormal result to your mychart with instructions.  - All results will be automatically released in mychart after 1 week.  - Your provider will provide you with explanation and instruction on all relevant material in your results. Please keep in mind, results and labs may appear confusing or abnormal to the untrained eye, but it does not mean they are actually abnormal for you personally. If you have any questions about your results that are not covered, or you desire more detailed explanation than what was provided, you should make an appointment with your provider to do so.   Our office handles many outgoing and incoming calls daily. If we have not contacted you within 1 week about your results, please check your mychart to see if there is a message first and if not, then contact our office.  In helping with this matter, you help decrease call volume, and therefore allow [place] to  be able to respond to patients needs more efficiently.   Acute office visits (sick visit):  An acute visit is intended for a new problem and are scheduled in shorter time slots to allow schedule openings for patients with new problems. This is the appropriate visit to discuss a new problem. In order to provide you with excellent quality medical care with proper time for you to explain your problem, have an exam and receive treatment with instructions, these appointments should be limited to one new  problem per visit. If you experience a new problem, in which you desire to be addressed, please make an acute office visit, we save openings on the schedule to accommodate you. Please do not save your new problem for any other type of visit, let us take care of it properly and quickly for you.   Follow up visits:  Depending on your condition(s) your provider will need to see you routinely in order to provide you with quality care and prescribe medication(s). Most chronic conditions (Example: hypertension, Diabetes, depression/anxiety... etc), require visits a couple times a year. Your provider will instruct you on proper follow up for your personal medical conditions and history. Please make certain to make follow up appointments for your condition as instructed. Failing to do so could result in lapse in your medication treatment/refills. If you request a refill, and are overdue to be seen on a condition, we will always provide you with a 30 day script (once) to allow you time to schedule.    Medicare wellness (well visit): - we have a wonderful Nurse Maudie Mercury), that will meet with you and provide you will yearly medicare wellness visits. These visits should occur yearly (can not be scheduled less than 1 calendar year apart) and cover preventive health, immunizations, advance directives and screenings you are entitled to yearly through your medicare benefits. Do not miss out on your entitled benefits, this is when medicare will pay for these benefits to be ordered for you.  These are strongly encouraged by your provider and is the appropriate type of visit to make certain you are up to date with all preventive health benefits. If you have not had your medicare wellness exam in the last 12 months, please make certain to schedule one by calling the office and schedule your medicare wellness with Maudie Mercury as soon as possible.   Yearly physical (well visit):  - Adults are recommended to be seen yearly for physicals.  Check with your insurance and date of your last physical, most insurances require one calendar year between physicals. Physicals include all preventive health topics, screenings, medical exam and labs that are appropriate for gender/age and history. You may have fasting labs needed at this visit. This is a well visit (not a sick visit), acute topics should not be covered during this visit.  - Pediatric patients are seen more frequently when they are younger. Your provider will advise you on well child visit timing that is appropriate for your their age. - This is not a medicare wellness visit. Medicare wellness exams do not have an exam portion to the visit. Some medicare companies allow for a physical, some do not allow a yearly physical. If your medicare allows a yearly physical you can schedule the medicare wellness with our nurse Maudie Mercury and have your physical with your provider after, on the same day. Please check with insurance for your full benefits.   Late Policy/No Shows:  - all new patients should arrive 15-30 minutes earlier  than appointment to allow [place] time  to  obtain all personal demographics,  insurance information and for you to complete office paperwork. - All established patients should arrive 10-15 minutes earlier than appointment time to update all information and be checked in .  - In our best efforts to run on time, if you are late for your appointment you will be asked to either reschedule or if able, we will work you back into the schedule. There will be a wait time to work you back in the schedule,  depending on availability.  - If you are unable to make it to your appointment as scheduled, please call 24 hours ahead of time to allow [place] to fill the time slot with someone else who needs to be seen. If you do not cancel your appointment ahead of time, you may be charged a no show fee.

## 2016-02-07 NOTE — Progress Notes (Signed)
Laura Ritter , July 31, 1965, 51 y.o., female MRN: [id number] Patient Care Team    Relationship Specialty Notifications Start End  Ma Hillock, DO PCP - General Family Medicine  03/08/15   Irine Seal, MD Attending Physician Urology  01/14/16   Skeet Latch, MD Attending Physician Cardiology  01/14/16    Comment: palpitations  Marshell Garfinkel, MD Consulting Physician Pulmonary Disease  01/14/16    Comment: nodules  Regina Eck, CNM Referring Physician Certified Nurse Midwife  01/14/16   Lyndal Pulley, DO Attending Physician Family Medicine  01/14/16    Comment: sports med    CC: bug bite Subjective: Pt presents for an acute OV with complaints of bug bite of 5 days duration.  Associated symptoms include mild erythema and itchiness. She denies increased redness, drainage or swelling. She reports it is much improved, after applying ice and using benadryl.  She is also due for her repeat urinalysis for hematuria.   No Known Allergies Social History  Substance Use Topics  . Smoking status: Never Smoker  . Smokeless tobacco: Never Used  . Alcohol use 0.0 oz/week     Comment: 3-4 drinks weekly   Past Medical History:  Diagnosis Date  . Migraines   . PVC (premature ventricular contraction) 04/01/2015  . Renal calculi 4/09   followed yearly follow up- Dr. Jeffie Pollock  . Screening for HIV (human immunodeficiency virus)    Past Surgical History:  Procedure Laterality Date  . BACK SURGERY  1/16   herniated disc  . CESAREAN SECTION     x3  . CHOLECYSTECTOMY  2001  . LAPAROSCOPIC CHOLECYSTECTOMY  2001  . REFRACTIVE SURGERY Bilateral 2006   lasik  . SPINE SURGERY     Family History  Problem Relation Age of Onset  . Hyperlipidemia Mother   . Alcohol abuse Mother   . Arthritis Mother   . Alzheimer's disease Mother   . Hypertension Father   . Parkinson's disease Father   . Alcohol abuse Father   . Kidney Stones Brother   . Alcohol abuse Brother   . Migraines Son     Allergies as of 02/07/2016   No Known Allergies     Medication List       Accurate as of 02/07/16  3:38 PM. Always use your most recent med list.          BLACK COHOSH EXTRACT PO Take by mouth. Reported on 07/27/2015   CALCIUM 600+D 600-400 MG-UNIT tablet Generic drug:  Calcium Carbonate-Vitamin D Take 1 tablet by mouth 2 (two) times daily.   Estradiol 10 MCG Tabs vaginal tablet One tablet twice weekly per vagina   MULTIVITAMIN PO Take by mouth.   PROBIOTIC PO Take by mouth daily.   TURMERIC PO Take 300 mg by mouth 2 (two) times daily.   VITAMIN D-3 PO Take 2,000 Units by mouth 2 (two) times daily.   XIIDRA 5 % Soln Generic drug:  Lifitegrast Place 1 drop into both eyes 2 (two) times daily.       No results found for this or any previous visit (from the past 24 hour(s)). No results found.   ROS: Negative, with the exception of above mentioned in HPI   Objective:  BP 104/69 (BP Location: Right Arm, Patient Position: Sitting, Cuff Size: Normal)   Pulse 60   Temp 98.3 F (36.8 C)   Resp 20   Wt 139 lb (63 kg)   LMP 01/12/2014 (LMP Unknown)   SpO2  100%   BMI 21.77 kg/m  Body mass index is 21.77 kg/m. Gen: Afebrile. No acute distress. Nontoxic in appearance, well developed, well nourished.  HENT: AT. [place]. MMM Skin: 2 puncture wounds right superior trap area. No erythema, mild swelling, no drainage. no rashes, purpura or petechiae.    Assessment/Plan: Laura Ritter is a 51 y.o. female present for acute OV for  Hematuria, unspecified type - rpt collected today. H/o kidney stone. She has stopped the tumeric.  - Urinalysis, Routine w reflex microscopic Insect bite, initial encounter Odd bug bite, or two small bites.  Steroid topical cream. Benadryl if needed. Does not appear infected, small histamine reaction.   F/U PRN  electronically signed by:  Howard Pouch, DO  [place]

## 2016-02-08 ENCOUNTER — Telehealth: Payer: Self-pay | Admitting: Family Medicine

## 2016-02-08 NOTE — Telephone Encounter (Signed)
Spoke with patient reviewed results. Patient states she prefers to follow with her Urologist Dr Jeffie Pollock since he is familiar with her and has done her lithotripsy . She states she doesn't need a referral since she is a current patient there.

## 2016-02-08 NOTE — Telephone Encounter (Signed)
Please call pt: - her urine contained many crystals and large blood, suggesting her symptoms are caused by kidney stones (which she has a h/o). Given that she now has more blood in her urine on retest and the time span it has been present, I would suggest further studies and/or urology referral.  - Please advise me on her decision. Further studies here would include labs and a CT stone study to ensure kidneys/ureters are ok and do not have stone blockage. If she decides to have this done before uro referral, then I will place the orders and she will follow up with me after studies complete to discuss.  - in the meantime, increase water intake and watch the content of protein in her meals, high protein can cause this type of stone.

## 2016-02-08 NOTE — Telephone Encounter (Signed)
Noted  

## 2016-04-05 ENCOUNTER — Encounter: Payer: Self-pay | Admitting: Certified Nurse Midwife

## 2016-04-05 ENCOUNTER — Ambulatory Visit (INDEPENDENT_AMBULATORY_CARE_PROVIDER_SITE_OTHER): Payer: BLUE CROSS/BLUE SHIELD | Admitting: Certified Nurse Midwife

## 2016-04-05 VITALS — BP 112/60 | HR 64 | Resp 14 | Ht 67.0 in | Wt 131.0 lb

## 2016-04-05 DIAGNOSIS — Z23 Encounter for immunization: Secondary | ICD-10-CM

## 2016-04-05 DIAGNOSIS — Z124 Encounter for screening for malignant neoplasm of cervix: Secondary | ICD-10-CM | POA: Diagnosis not present

## 2016-04-05 DIAGNOSIS — N952 Postmenopausal atrophic vaginitis: Secondary | ICD-10-CM

## 2016-04-05 DIAGNOSIS — Z01419 Encounter for gynecological examination (general) (routine) without abnormal findings: Secondary | ICD-10-CM | POA: Diagnosis not present

## 2016-04-05 MED ORDER — ESTRADIOL 10 MCG VA TABS: ORAL_TABLET | VAGINAL | 4 refills | Status: DC

## 2016-04-05 NOTE — Patient Instructions (Signed)

## 2016-04-05 NOTE — Progress Notes (Signed)
51 y.o. G47P3003 Married  Caucasian Fe here for annual exam. Menopausal no HRT. Occasional hot flashes or night sweats, no insomnia.Continues Vagifem use with good results twice weekly and would like to continue. Denies any vaginal bleeding. Continues to see Pulmonary specialist and nodules are still present with increase in number, but no size change, so still considered benign. Aware due for colonoscopy, this year. Spouse did Cologuard. Sees PCP for aex, labs and will decide if she will use Cologuard also or request referral for colonoscopy. Was also seen by cardiology for palpitations and all negative. No other health concerns today. Taking trip to [place] in 4 days!  Patient's last menstrual period was 01/12/2014 (lmp unknown).          Sexually active: Yes.    The current method of family planning is post menopausal status.    Exercising: Yes.    cardio/weight class Smoker:  no  Health Maintenance: Pap:  03-10-14 neg HPV HR neg MMG:  07-30-15 category b density birads 1:neg Colonoscopy:  none BMD:   none TDaP:  01/30/2005 Shingles: no Pneumonia: no Hep C and HIV: HIV neg before marriage, Hep c neg 2016 Labs: PCP takes care of all labs Self breast exam: occasionally   reports that she has never smoked. She has never used smokeless tobacco. She reports that she drinks alcohol. She reports that she does not use drugs.  Past Medical History:  Diagnosis Date  . Migraines   . PVC (premature ventricular contraction) 04/01/2015  . Renal calculi 4/09   followed yearly follow up- Dr. Jeffie Pollock  . Screening for HIV (human immunodeficiency virus)     Past Surgical History:  Procedure Laterality Date  . BACK SURGERY  1/16   herniated disc  . CESAREAN SECTION     x3  . CHOLECYSTECTOMY  2001  . LAPAROSCOPIC CHOLECYSTECTOMY  2001  . REFRACTIVE SURGERY Bilateral 2006   lasik  . SPINE SURGERY      Current Outpatient Prescriptions  Medication Sig Dispense Refill  . Calcium Carbonate-Vitamin D  (CALCIUM 600+D) 600-400 MG-UNIT tablet Take 1 tablet by mouth 2 (two) times daily.    . Cholecalciferol (VITAMIN D-3 PO) Take 2,000 Units by mouth 2 (two) times daily.     . Estradiol 10 MCG TABS vaginal tablet One tablet twice weekly per vagina 24 tablet 3  . Multiple Vitamins-Minerals (MULTIVITAMIN PO) Take by mouth.    . Probiotic Product (PROBIOTIC PO) Take by mouth daily.    Marland Kitchen XIIDRA 5 % SOLN Place 1 drop into both eyes 2 (two) times daily.     No current facility-administered medications for this visit.     Family History  Problem Relation Age of Onset  . Hyperlipidemia Mother   . Alcohol abuse Mother   . Arthritis Mother   . Alzheimer's disease Mother   . Hypertension Father   . Parkinson's disease Father   . Alcohol abuse Father   . Kidney Stones Brother   . Alcohol abuse Brother   . Migraines Son     ROS:  Pertinent items are noted in HPI.  Otherwise, a comprehensive ROS was negative.  Exam:   BP 112/60 (BP Location: Right Arm, Patient Position: Sitting, Cuff Size: Normal)   Pulse 64   Resp 14   Ht 5\' 7"  (1.702 m)   Wt 131 lb (59.4 kg)   LMP 01/12/2014 (LMP Unknown)   BMI 20.52 kg/m  Height: 5\' 7"  (170.2 cm) Ht Readings from Last 3 Encounters:  04/05/16 5\' 7"  (1.702 m)  10/21/15 5\' 7"  (1.702 m)  05/19/15 5' 7.25" (1.708 m)    General appearance: alert, cooperative and appears stated age Head: Normocephalic, without obvious abnormality, atraumatic Neck: no adenopathy, supple, symmetrical, trachea midline and thyroid normal to inspection and palpation Lungs: clear to auscultation bilaterally Breasts: normal appearance, no masses or tenderness, No nipple retraction or dimpling, No nipple discharge or bleeding, No axillary or supraclavicular adenopathy Heart: regular rate and rhythm Abdomen: soft, non-tender; no masses,  no organomegaly Extremities: extremities normal, atraumatic, no cyanosis or edema Skin: Skin color, texture, turgor normal. No rashes or  lesions Lymph nodes: Cervical, supraclavicular, and axillary nodes normal. No abnormal inguinal nodes palpated Neurologic: Grossly normal   Pelvic: External genitalia:  no lesions              Urethra:  normal appearing urethra with no masses, tenderness or lesions              Bartholin's and Skene's: normal                 Vagina: normal appearing vagina with normal color and discharge, no lesions, moisture noted              Cervix: no bleeding following Pap, no cervical motion tenderness and no lesions              Pap taken: Yes.   Bimanual Exam:  Uterus:  normal size, contour, position, consistency, mobility, non-tender              Adnexa: normal adnexa and no mass, fullness, tenderness               Rectovaginal: Confirms               Anus:  normal sphincter tone, no lesions  Chaperone present: yes  A:  Well Woman with normal exam  Menopausal no HRT   Atrophic vaginitis with Vagifem use with good response, desires continuance  Lung nodules bilateral, no significant change at last evaluation, still considered benign  Colonoscopy due patient will call if referral desired   P:   Reviewed health and wellness pertinent to exam  Aware of need to evaluate if vaginal bleeding  Risks and benefits of Vagifem discussed, patient desires continuance  Rx Vagifem see order with instructions  Continue follow up with MD as indicated  Pap smear as above   counseled on breast self exam, mammography screening, adequate intake of calcium and vitamin D, diet and exercise  return annually or prn  An After Visit Summary was printed and given to the patient.

## 2016-04-06 LAB — IPS PAP TEST WITH REFLEX TO HPV

## 2016-04-06 NOTE — Progress Notes (Signed)
Encounter reviewed Jahree Dermody, MD   

## 2016-06-06 ENCOUNTER — Other Ambulatory Visit: Payer: Self-pay | Admitting: Certified Nurse Midwife

## 2016-06-06 DIAGNOSIS — N952 Postmenopausal atrophic vaginitis: Secondary | ICD-10-CM

## 2016-06-06 NOTE — Telephone Encounter (Signed)
Prescription sent to [facility] instead of [facility] in [facility].

## 2016-07-06 ENCOUNTER — Other Ambulatory Visit: Payer: Self-pay | Admitting: Certified Nurse Midwife

## 2016-07-06 DIAGNOSIS — Z1239 Encounter for other screening for malignant neoplasm of breast: Secondary | ICD-10-CM

## 2016-08-01 ENCOUNTER — Ambulatory Visit (INDEPENDENT_AMBULATORY_CARE_PROVIDER_SITE_OTHER): Payer: BLUE CROSS/BLUE SHIELD

## 2016-08-01 ENCOUNTER — Other Ambulatory Visit: Payer: Self-pay | Admitting: Certified Nurse Midwife

## 2016-08-01 DIAGNOSIS — Z1239 Encounter for other screening for malignant neoplasm of breast: Secondary | ICD-10-CM

## 2016-08-01 DIAGNOSIS — Z1231 Encounter for screening mammogram for malignant neoplasm of breast: Secondary | ICD-10-CM | POA: Diagnosis not present

## 2016-09-04 ENCOUNTER — Ambulatory Visit (INDEPENDENT_AMBULATORY_CARE_PROVIDER_SITE_OTHER): Payer: BLUE CROSS/BLUE SHIELD | Admitting: Family Medicine

## 2016-09-04 ENCOUNTER — Encounter: Payer: Self-pay | Admitting: Family Medicine

## 2016-09-04 VITALS — BP 109/70 | HR 62 | Temp 98.2°F | Resp 20 | Ht 67.0 in | Wt 134.5 lb

## 2016-09-04 DIAGNOSIS — M7022 Olecranon bursitis, left elbow: Secondary | ICD-10-CM | POA: Diagnosis not present

## 2016-09-04 MED ORDER — NAPROXEN 500 MG PO TABS: 500.0000 mg | ORAL_TABLET | Freq: Two times a day (BID) | ORAL | 0 refills | Status: DC

## 2016-09-04 NOTE — Patient Instructions (Signed)
Naproxen 500 mg every 12 hours for 5-7 days to hopefully decrease bursitis/ inflammation.   Try to keep area covered for protection if able.     Elbow Bursitis A bursa is a fluid-filled sac that covers and protects a joint. Bursitis is when the fluid-filled sac gets puffy and sore (inflamed). Elbow bursitis, also called olecranon bursitis, happens over your elbow. This may be caused by:  Injury (acute trauma) to your elbow.  Leaning on hard surfaces for long periods of time.  Infection from an injury that breaks the skin near your elbow.  A bone growth (spur) that forms at the tip of your elbow.  A medical condition that causes inflammation in your body, such as: ? Gout. ? Rheumatoid arthritis.  Sometimes the cause is not known. Follow these instructions at home:  Take medicines only as told by your doctor.  If you were prescribed an antibiotic medicine, finish all of it even if you start to feel better.  If your bursitis is caused by an injury, rest your elbow and wear your bandage as told by your doctor. You may also apply ice to the injured area as told by your doctor: ? Put ice in a plastic bag. ? Place a towel between your skin and the bag. ? Leave the ice on for 20 minutes, 2-3 times per day.  Do not do any activities that cause pain to your elbow.  Use elbow pads or wraps to cushion your elbow. Contact a doctor if:  You have a fever.  Your symptoms do not get better with treatment.  Your pain or swelling gets worse.  Your pain or swelling goes away and then comes back.  You have drainage of pus from the swollen area over your elbow. This information is not intended to replace advice given to you by your health care provider. Make sure you discuss any questions you have with your health care provider. Document Released: 07/06/2009 Document Revised: 06/24/2015 Document Reviewed: 09/24/2013 Elsevier Interactive Patient Education  [employer].

## 2016-09-04 NOTE — Progress Notes (Signed)
Laura Ritter , 06-12-1965, 51 y.o., female MRN: [id number] Patient Care Team    Relationship Specialty Notifications Start End  Laura Hillock, DO PCP - General Family Medicine  03/08/15   Laura Seal, MD Attending Physician Urology  01/14/16   Laura Latch, MD Attending Physician Cardiology  01/14/16    Comment: palpitations  Laura Garfinkel, MD Consulting Physician Pulmonary Disease  01/14/16    Comment: nodules  Laura Ritter, CNM Referring Physician Certified Nurse Midwife  01/14/16   Laura Pulley, DO Attending Physician Family Medicine  01/14/16    Comment: sports med    Chief Complaint  Patient presents with  . Elbow Pain    left seen at UC treted with bactrim for 10 days     Subjective: Pt presents for an OV with complaints of Left elbow pain of every week duration.  Associated symptoms include redness and swelling has resolved. Patient reports she had redness, swelling and pain of the left elbow after having a small abrasion and leaning on her elbow. She was treated for cellulitis with Bactrim 10 days by an urgent care. She has finished this antibiotic and reports great improvement in the redness and swelling. She denies fever, chills, nausea. She states she has remaining tenderness at the elbow, but she frequently has mild bursitis on occasions in this elbow.   Depression screen PHQ 2/9 09/04/2016  Decreased Interest 0  Down, Depressed, Hopeless 0  PHQ - 2 Score 0    No Known Allergies Social History  Substance Use Topics  . Smoking status: Never Smoker  . Smokeless tobacco: Never Used  . Alcohol use 0.0 oz/week     Comment: 3-4 drinks weekly   Past Medical History:  Diagnosis Date  . Migraines   . PVC (premature ventricular contraction) 04/01/2015  . Renal calculi 4/09   followed yearly follow up- Dr. Jeffie Ritter  . Screening for HIV (human immunodeficiency virus)    Past Surgical History:  Procedure Laterality Date  . BACK SURGERY  1/16   herniated disc  . CESAREAN SECTION     x3  . CHOLECYSTECTOMY  2001  . LAPAROSCOPIC CHOLECYSTECTOMY  2001  . REFRACTIVE SURGERY Bilateral 2006   lasik  . SPINE SURGERY     Family History  Problem Relation Age of Onset  . Hyperlipidemia Mother   . Alcohol abuse Mother   . Arthritis Mother   . Alzheimer's disease Mother   . Hypertension Father   . Parkinson's disease Father   . Alcohol abuse Father   . Kidney Stones Brother   . Alcohol abuse Brother   . Migraines Son    Allergies as of 09/04/2016   No Known Allergies     Medication List       Accurate as of 09/04/16  2:54 PM. Always use your most recent med list.          CALCIUM 600+D 600-400 MG-UNIT tablet Generic drug:  Calcium Carbonate-Vitamin D Take 1 tablet by mouth 2 (two) times daily.   Estradiol 10 MCG Tabs vaginal tablet One tablet twice weekly per vagina   YUVAFEM 10 MCG Tabs vaginal tablet Generic drug:  Estradiol INSERT 1 TABLET VAGINALLY TWICE WEEKLY   MULTIVITAMIN PO Take by mouth.   PROBIOTIC PO Take by mouth daily.   sulfamethoxazole-trimethoprim 800-160 MG tablet Commonly known as:  BACTRIM DS,SEPTRA DS TAKE 1 TABLET BY MOUTH EVERY 12 HOURS FOR 10 DAYS   VITAMIN D-3 PO Take 2,000 Units  by mouth 2 (two) times daily.   XIIDRA 5 % Soln Generic drug:  Lifitegrast Place 1 drop into both eyes 2 (two) times daily.       All past medical history, surgical history, allergies, family history, immunizations andmedications were updated in the EMR today and reviewed under the history and medication portions of their EMR.     ROS: Negative, with the exception of above mentioned in HPI   Objective:  BP 109/70 (BP Location: Right Arm, Patient Position: Sitting, Cuff Size: Normal)   Pulse 62   Temp 98.2 F (36.8 C)   Resp 20   Ht 5\' 7"  (1.702 m)   Wt 134 lb 8 oz (61 kg)   LMP 01/12/2014 (LMP Unknown)   SpO2 98%   BMI 21.07 kg/m  Body mass index is 21.07 kg/m. Gen: Afebrile. No acute  distress. Nontoxic in appearance, well developed, well nourished.  HENT: AT. [place]. MMM, no oral lesions.  Eyes:Pupils Equal Round Reactive to light, Extraocular movements intact,  Conjunctiva without redness, discharge or icterus. MSK/Left elbow: Skin intact, no erythema, no drainage. Mild swelling over olecranon bursa, mild tenderness to palpation over this area.  No exam data present No results found. No results found for this or any previous visit (from the past 24 hour(s)).  Assessment/Plan: Laura Ritter is a 51 y.o. female present for OV for  Olecranon bursitis of left elbow - Infection is resolved with use of Bactrim. Patient still has olecranon bursitis present. Discussed options of keeping area covered/padded, do not lean on elbows. Prescribe naproxen twice a day for 5-7 days. Follow-up 2-4 weeks if not improved, sooner if worsening.  Reviewed expectations re: course of current medical issues.  Discussed self-management of symptoms.  Outlined signs and symptoms indicating need for more acute intervention.  Patient verbalized understanding and all questions were answered.  Patient received an After-Visit Summary.    No orders of the defined types were placed in this encounter.  Note is dictated utilizing voice recognition software. Although note has been proof read prior to signing, occasional typographical errors still can be missed. If any questions arise, please do not hesitate to call for verification.   electronically signed by:  Laura Pouch, DO  [place]

## 2016-09-25 ENCOUNTER — Other Ambulatory Visit (HOSPITAL_BASED_OUTPATIENT_CLINIC_OR_DEPARTMENT_OTHER): Payer: BLUE CROSS/BLUE SHIELD

## 2016-10-26 ENCOUNTER — Ambulatory Visit (HOSPITAL_BASED_OUTPATIENT_CLINIC_OR_DEPARTMENT_OTHER)
Admission: RE | Admit: 2016-10-26 | Discharge: 2016-10-26 | Disposition: A | Discharge status: Home / Self Care | Payer: BLUE CROSS/BLUE SHIELD | Source: Ambulatory Visit | Attending: Pulmonary Disease | Admitting: Pulmonary Disease

## 2016-10-26 DIAGNOSIS — R918 Other nonspecific abnormal finding of lung field: Secondary | ICD-10-CM | POA: Insufficient documentation

## 2016-10-27 ENCOUNTER — Ambulatory Visit: Payer: BLUE CROSS/BLUE SHIELD

## 2016-10-30 ENCOUNTER — Telehealth: Payer: Self-pay | Admitting: Pulmonary Disease

## 2016-10-30 ENCOUNTER — Telehealth: Payer: Self-pay

## 2016-10-30 ENCOUNTER — Encounter: Payer: Self-pay | Admitting: Certified Nurse Midwife

## 2016-10-30 NOTE — Telephone Encounter (Signed)
Notes recorded by Marshell Garfinkel, MD on 10/30/2016 at 9:56 AM EDT Please let the patient know that the lung nodules are stable. Follow up with CT without contrast in 1 year.  Spoke with pt, advised results. PM does she still need to come to her appt Wednesday?

## 2016-10-30 NOTE — Telephone Encounter (Signed)
Patient returned call, CB is [phone]. Requests to call her after 3:30.  States she has patients until 3:15 today.

## 2016-10-30 NOTE — Telephone Encounter (Signed)
-----   Message from Marshell Garfinkel, MD sent at 10/30/2016  9:56 AM EDT ----- Please let the patient know that the lung nodules are stable. Follow up with CT without contrast in 1 year.

## 2016-10-30 NOTE — Telephone Encounter (Signed)
No need to come for appointment as CT is stable. We will see her in 1 year.  Marshell Garfinkel MD [facility] 10/30/2016, 8:02 PM

## 2016-10-30 NOTE — Telephone Encounter (Signed)
LVM on machine for patient to return phone call regarding results. Will try to call back at later time.

## 2016-10-31 ENCOUNTER — Telehealth: Payer: Self-pay | Admitting: *Deleted

## 2016-10-31 NOTE — Telephone Encounter (Signed)
See telephone encounter dated 10/31/16.

## 2016-10-31 NOTE — Telephone Encounter (Signed)
Spoke with patient, provided Dr. Juanita Craver for colonoscopy. Patient declined referral, will return call if needed. Patient verbalizes understanding and is agreeable.   Routing to provider for final review. Patient is agreeable to disposition. Will close encounter.     From Corky Downs Osmanovic To Regina Eck, CNM Sent 10/30/2016 5:47 PM  Hi Debbie.  You recommended me getting a colonoscopy at my last appt since I'm 54 and also gave me the name of an MD to go to for that, however I can't find that info. I think the MD was female. I'd appreciate your recommendation again! Thank you and see you in March.

## 2016-10-31 NOTE — Telephone Encounter (Signed)
lmtcb X1 for pt to make aware of recs.   

## 2016-11-01 ENCOUNTER — Ambulatory Visit: Payer: BLUE CROSS/BLUE SHIELD | Admitting: Pulmonary Disease

## 2016-11-01 NOTE — Telephone Encounter (Signed)
Called spoke with patient She was already aware that appt did not need to be kept and cancelled her 10.3.18 appt with PM There is already a recall in the system for Oct 2019  Nothing further needed; will sign off

## 2016-11-07 NOTE — Telephone Encounter (Signed)
Left voice mail on machine for patient to return phone call back regarding her CT results.  Will follow up again with patient at later date.

## 2016-11-22 NOTE — Telephone Encounter (Signed)
Left voice mail on machine for patient to return phone call back regarding results and recommendations. Mailed letter today in mail to contact us regarding results.  Will follow up again with patient at later date.

## 2016-12-14 ENCOUNTER — Encounter: Payer: Self-pay | Admitting: Family Medicine

## 2017-04-06 ENCOUNTER — Encounter: Payer: Self-pay | Admitting: Certified Nurse Midwife

## 2017-04-06 ENCOUNTER — Ambulatory Visit (INDEPENDENT_AMBULATORY_CARE_PROVIDER_SITE_OTHER): Payer: BLUE CROSS/BLUE SHIELD | Admitting: Certified Nurse Midwife

## 2017-04-06 ENCOUNTER — Other Ambulatory Visit: Payer: Self-pay

## 2017-04-06 VITALS — BP 100/58 | HR 62 | Resp 14 | Ht 67.5 in | Wt 138.0 lb

## 2017-04-06 DIAGNOSIS — R899 Unspecified abnormal finding in specimens from other organs, systems and tissues: Secondary | ICD-10-CM

## 2017-04-06 DIAGNOSIS — Z0000 Encounter for general adult medical examination without abnormal findings: Secondary | ICD-10-CM

## 2017-04-06 DIAGNOSIS — N951 Menopausal and female climacteric states: Secondary | ICD-10-CM | POA: Diagnosis not present

## 2017-04-06 DIAGNOSIS — N952 Postmenopausal atrophic vaginitis: Secondary | ICD-10-CM

## 2017-04-06 DIAGNOSIS — Z01419 Encounter for gynecological examination (general) (routine) without abnormal findings: Secondary | ICD-10-CM

## 2017-04-06 MED ORDER — ESTRADIOL 10 MCG VA TABS: ORAL_TABLET | VAGINAL | 4 refills | Status: DC

## 2017-04-06 NOTE — Patient Instructions (Signed)

## 2017-04-06 NOTE — Progress Notes (Signed)
52 y.o. G76P3003 Married  Caucasian Fe here for annual exam.  Menopausal no HRT, or vaginal bleeding. Vaginal dryness better with Vagifem twice weekly. Currently having kidney stone issues, history of and aware she may have another moving. No blood in urine. Has Urologist if needed. Continues follow up with Pulmonary regarding lung nodules, no biopsy recommended as of yet. Had colonoscopy with Dr. Collene Mares all negative! Busy with family, no health issues today. Desires screening labs. Sees PCP prn.  Patient's last menstrual period was 01/12/2014 (lmp unknown).          Sexually active: Yes.    The current method of family planning is post menopausal status.    Exercising: Yes.    Gym/ health club routine includes cardio. Smoker:  no  Health Maintenance: Pap:  03-10-14 neg HPV HR neg, 04-05-16 neg History of Abnormal Pap: no MMG:  08-01-16 category b density birads 1:neg Self Breast exams: occasionally Colonoscopy:  2018 BMD:   none TDaP:  2018 Shingles: no Pneumonia: no Hep C and HIV: HIV before marriage, Hep c neg 2016 Labs: yes   reports that  has never smoked. she has never used smokeless tobacco. She reports that she drinks alcohol. She reports that she does not use drugs.  Past Medical History:  Diagnosis Date  . Migraines   . PVC (premature ventricular contraction) 04/01/2015  . Renal calculi 4/09   followed yearly follow up- Dr. Jeffie Pollock  . Screening for HIV (human immunodeficiency virus)     Past Surgical History:  Procedure Laterality Date  . BACK SURGERY  1/16   herniated disc  . CESAREAN SECTION     x3  . CHOLECYSTECTOMY  2001  . LAPAROSCOPIC CHOLECYSTECTOMY  2001  . REFRACTIVE SURGERY Bilateral 2006   lasik  . SPINE SURGERY      Current Outpatient Medications  Medication Sig Dispense Refill  . Calcium Carbonate-Vitamin D (CALCIUM 600+D) 600-400 MG-UNIT tablet Take 1 tablet by mouth 2 (two) times daily.    . Cholecalciferol (VITAMIN D-3 PO) Take 2,000 Units by mouth 2 (two)  times daily.     . Estradiol 10 MCG TABS vaginal tablet One tablet twice weekly per vagina 24 tablet 4  . Multiple Vitamins-Minerals (MULTIVITAMIN PO) Take by mouth.    . Probiotic Product (PROBIOTIC PO) Take by mouth daily.    Marland Kitchen XIIDRA 5 % SOLN Place 1 drop into both eyes 2 (two) times daily.     No current facility-administered medications for this visit.     Family History  Problem Relation Age of Onset  . Hyperlipidemia Mother   . Alcohol abuse Mother   . Arthritis Mother   . Alzheimer's disease Mother   . Hypertension Father   . Parkinson's disease Father   . Alcohol abuse Father   . Kidney Stones Brother   . Alcohol abuse Brother   . Migraines Son     ROS:  Pertinent items are noted in HPI.  Otherwise, a comprehensive ROS was negative.  Exam:   BP (!) 100/58 (BP Location: Right Arm, Patient Position: Sitting, Cuff Size: Normal)   Pulse 62   Resp 14   Ht 5' 7.5" (1.715 m)   Wt 138 lb (62.6 kg)   LMP 01/12/2014 (LMP Unknown)   BMI 21.29 kg/m  Height: 5' 7.5" (171.5 cm) Ht Readings from Last 3 Encounters:  04/06/17 5' 7.5" (1.715 m)  09/04/16 5\' 7"  (1.702 m)  04/05/16 5\' 7"  (1.702 m)    General appearance:  alert, cooperative and appears stated age Head: Normocephalic, without obvious abnormality, atraumatic Neck: no adenopathy, supple, symmetrical, trachea midline and thyroid normal to inspection and palpation Lungs: clear to auscultation bilaterally Breasts: normal appearance, no masses or tenderness Heart: regular rate and rhythm Abdomen: soft, non-tender; no masses,  no organomegaly Extremities: extremities normal, atraumatic, no cyanosis or edema Skin: Skin color, texture, turgor normal. No rashes or lesions Lymph nodes: Cervical, supraclavicular, and axillary nodes normal. No abnormal inguinal nodes palpated Neurologic: Grossly normal   Pelvic: External genitalia:  no lesions, normal female              Urethra:  normal appearing urethra with no masses,  tenderness or lesions              Bartholin's and Skene's: normal                 Vagina: normal appearing vagina with normal color and discharge, no lesions              Cervix: multiparous appearance, no cervical motion tenderness and no lesions              Pap taken: No. Bimanual Exam:  Uterus:  normal size, contour, position, consistency, mobility, non-tender              Adnexa: normal adnexa and no mass, fullness, tenderness               Rectovaginal: Confirms               Anus:  normal sphincter tone, no lesions  Chaperone present: yes  A:  Well Woman with normal exam  Menopausal no HRT  Atrophic Vaginitis with Vagifem use working well  History of Pulmonary Nodules with follow up due in 9/19  History of kidney stones, possible reoccurrence has Urologist  Screening labs  P:   Reviewed health and wellness pertinent to exam  Aware of need to advise if vaginal bleeding.  Discussed risks/benefits/warning signs of Vagifem. Desires continuance.  Rx Vagifem see order with instructions  Continue follow up as indicated with Pulmonary and urology as needed.  Labs: CMP, CBC, Lipid panel, TSH, Vitamin D  Pap smear: no   counseled on breast self exam, mammography screening, adequate intake of calcium and vitamin D, diet and exercise, Kegel's exercises  return annually or prn  An After Visit Summary was printed and given to the patient.

## 2017-04-07 LAB — COMPREHENSIVE METABOLIC PANEL
ALT: 35 IU/L — ABNORMAL HIGH (ref 0–32)
AST: 31 IU/L (ref 0–40)
Albumin/Globulin Ratio: 1.9 (ref 1.2–2.2)
Albumin: 4.2 g/dL (ref 3.5–5.5)
Alkaline Phosphatase: 51 IU/L (ref 39–117)
BUN/Creatinine Ratio: 11 (ref 9–23)
BUN: 15 mg/dL (ref 6–24)
Bilirubin Total: 0.4 mg/dL (ref 0.0–1.2)
CO2: 27 mmol/L (ref 20–29)
Calcium: 8.8 mg/dL (ref 8.7–10.2)
Chloride: 101 mmol/L (ref 96–106)
Creatinine, Ser: 1.31 mg/dL — ABNORMAL HIGH (ref 0.57–1.00)
GFR calc Af Amer: 54 mL/min/{1.73_m2} — ABNORMAL LOW (ref >59)
GFR calc non Af Amer: 47 mL/min/{1.73_m2} — ABNORMAL LOW (ref >59)
Globulin, Total: 2.2 g/dL (ref 1.5–4.5)
Glucose: 91 mg/dL (ref 65–99)
Potassium: 4.1 mmol/L (ref 3.5–5.2)
Sodium: 140 mmol/L (ref 134–144)
Total Protein: 6.4 g/dL (ref 6.0–8.5)

## 2017-04-07 LAB — CBC
Hematocrit: 37.9 % (ref 34.0–46.6)
Hemoglobin: 12.7 g/dL (ref 11.1–15.9)
MCH: 28.5 pg (ref 26.6–33.0)
MCHC: 33.5 g/dL (ref 31.5–35.7)
MCV: 85 fL (ref 79–97)
Platelets: 130 10*3/uL — ABNORMAL LOW (ref 150–379)
RBC: 4.46 x10E6/uL (ref 3.77–5.28)
RDW: 13.5 % (ref 12.3–15.4)
WBC: 5 10*3/uL (ref 3.4–10.8)

## 2017-04-07 LAB — TSH: TSH: 1.15 u[IU]/mL (ref 0.450–4.500)

## 2017-04-07 LAB — LIPID PANEL
Chol/HDL Ratio: 1.8 ratio (ref 0.0–4.4)
Cholesterol, Total: 147 mg/dL (ref 100–199)
HDL: 80 mg/dL (ref >39)
LDL Calculated: 53 mg/dL (ref 0–99)
Triglycerides: 68 mg/dL (ref 0–149)
VLDL Cholesterol Cal: 14 mg/dL (ref 5–40)

## 2017-04-07 LAB — VITAMIN D 25 HYDROXY (VIT D DEFICIENCY, FRACTURES): Vit D, 25-Hydroxy: 42.9 ng/mL (ref 30.0–100.0)

## 2017-04-11 ENCOUNTER — Other Ambulatory Visit: Payer: Self-pay | Admitting: Urology

## 2017-04-11 ENCOUNTER — Telehealth: Payer: Self-pay | Admitting: *Deleted

## 2017-04-11 ENCOUNTER — Other Ambulatory Visit: Payer: Self-pay | Admitting: Certified Nurse Midwife

## 2017-04-11 DIAGNOSIS — R899 Unspecified abnormal finding in specimens from other organs, systems and tissues: Secondary | ICD-10-CM

## 2017-04-11 NOTE — Telephone Encounter (Signed)
Notes recorded by Burnice Logan, RN on 04/11/2017 at 12:10 PM EDT Spoke with patient. Patient states she will return call to office, now is not a good time. ------  Notes recorded by Regina Eck, CNM on 04/11/2017 at 7:55 AM EDT Notify patient TSH is normal Lipid panel all normal very good profile Liver, kidney and glucose profile , glucose normal Kidney creatinine is elevated which can be from overload of protein intake or kidney issues. Would like to repeat in 2 weeks, make sure well hydrated for lab and reduce protein intake over the 2 week period Liver ALT elevated which can be elevated if taking OTC advil , tylenol , please avoid all use and recheck at 2 weeks also Vitamin D is normal at 42.9 CBC is normal with exception of platelets which are slightly low, which can occur with low iron, recheck at 2 weeks and will check iron also   Orders placed, does not need to be fasting

## 2017-04-11 NOTE — Telephone Encounter (Signed)
Spoke with patient, advised as seen below per Melvia Heaps, CNM. Patient states she is scheduled for lithotripsy for kidney stones on 3/21 and has been taking tylenol and ibuprofen for pain. Patient requesting to repeat labs 2 wks after lithotripsy.   Labs scheduled for 05/04/17 at 3:30pm.  Routing to provider for final review. Patient is agreeable to disposition. Will close encounter.

## 2017-04-16 ENCOUNTER — Other Ambulatory Visit: Payer: Self-pay

## 2017-04-16 ENCOUNTER — Encounter (HOSPITAL_COMMUNITY): Payer: Self-pay | Admitting: *Deleted

## 2017-04-16 NOTE — Progress Notes (Signed)
Please sign lithotripsy orders in epic; pt is scheduled for procedure on Thursday March 21. 2019. Thanks.

## 2017-04-17 ENCOUNTER — Ambulatory Visit (HOSPITAL_COMMUNITY)
Admission: RE | Admit: 2017-04-17 | Discharge: 2017-04-17 | Disposition: A | Discharge status: Home / Self Care | Payer: BLUE CROSS/BLUE SHIELD | Source: Ambulatory Visit | Attending: Urology | Admitting: Urology

## 2017-04-17 ENCOUNTER — Other Ambulatory Visit: Payer: Self-pay

## 2017-04-17 DIAGNOSIS — I498 Other specified cardiac arrhythmias: Secondary | ICD-10-CM | POA: Insufficient documentation

## 2017-04-18 NOTE — H&P (Signed)
Urology Preoperative H&P   Chief Complaint: Right flank pain  History of Present Illness: Laura Ritter is a 52 y.o. female prior history of renal stones. She presents today with complaints of right flank pain that began about 6 days. She has been using both Tyelnol and Ibuprofen. She states that the pain does not radiate. No real aggregating or alleviating factors. She denies exacerbation of voiding symptoms or gross heamturia. She has had some intermittent nausea, but no vomiting. She had a KUB in the office on 04/09/17 that confirmed a 7 mm right UPJ stone as well as multiple bilateral, non-obstructing stones.   Past Medical History:  Diagnosis Date  . Migraines   . PVC (premature ventricular contraction) 04/01/2015  . Renal calculi 4/09   followed yearly follow up- Dr. Jeffie Pollock  . Screening for HIV (human immunodeficiency virus)     Past Surgical History:  Procedure Laterality Date  . BACK SURGERY  1/16   herniated disc  . CESAREAN SECTION     x3  . CHOLECYSTECTOMY  2001  . LAPAROSCOPIC CHOLECYSTECTOMY  2001  . REFRACTIVE SURGERY Bilateral 2006   lasik  . SPINE SURGERY      Allergies: No Known Allergies  Family History  Problem Relation Age of Onset  . Hyperlipidemia Mother   . Alcohol abuse Mother   . Arthritis Mother   . Alzheimer's disease Mother   . Hypertension Father   . Parkinson's disease Father   . Alcohol abuse Father   . Kidney Stones Brother   . Alcohol abuse Brother   . Migraines Son     Social History:  reports that  has never smoked. she has never used smokeless tobacco. She reports that she drinks alcohol. She reports that she does not use drugs.  ROS: A complete review of systems was performed.  All systems are negative except for pertinent findings as noted.  Physical Exam:  Vital signs in last 24 hours:   Constitutional:  Alert and oriented, No acute distress Cardiovascular: Regular rate and rhythm, No JVD Respiratory: Normal respiratory  effort, Lungs clear bilaterally GI: Abdomen is soft, nontender, nondistended, no abdominal masses GU: No CVA tenderness Lymphatic: No lymphadenopathy Neurologic: Grossly intact, no focal deficits Psychiatric: Normal mood and affect  Laboratory Data:  No results for input(s): WBC, HGB, HCT, PLT in the last 72 hours.  No results for input(s): NA, K, CL, GLUCOSE, BUN, CALCIUM, CREATININE in the last 72 hours.  Invalid input(s): CO3   No results found for this or any previous visit (from the past 24 hour(s)). No results found for this or any previous visit (from the past 240 hour(s)).  Renal Function: No results for input(s): CREATININE in the last 168 hours. Estimated Creatinine Clearance: 50.2 mL/min (A) (by C-G formula based on SCr of 1.31 mg/dL (H)).  Radiologic Imaging: No results found.  I independently reviewed the above imaging studies.  Assessment and Plan Laura Ritter is a 52 y.o. female with a 7 mm right UPJ stone as well as bilateral non-obstructing renal stones.  She is here today to treat her right UPJ stone via ESWL.  The risks, benefits and alternatives of right ESWL was discussed with the patient.  I described the risks which include arrhythmia, kidney contusion, kidney hemorrhage, need for transfusion, back discomfort, flank ecchymosis, flank abrasion, inability to break up stone, inability to pass stone fragments, Steinstrasse, infection associated with obstructing stones, need for different surgical procedure and possible need for repeat shockwave  lithotripsy. The patient voices understanding and wishes to proceed.   Ellison Hughs, MD 04/18/2017, 9:53 AM  [facility] Pager: [phone]

## 2017-04-19 ENCOUNTER — Ambulatory Visit (HOSPITAL_COMMUNITY): Payer: BLUE CROSS/BLUE SHIELD

## 2017-04-19 ENCOUNTER — Encounter (HOSPITAL_COMMUNITY): Payer: Self-pay | Admitting: General Practice

## 2017-04-19 ENCOUNTER — Encounter (HOSPITAL_COMMUNITY)
Admission: RE | Disposition: A | Discharge status: Home / Self Care | Payer: Self-pay | Source: Ambulatory Visit | Attending: Urology

## 2017-04-19 ENCOUNTER — Ambulatory Visit (HOSPITAL_COMMUNITY)
Admission: RE | Admit: 2017-04-19 | Discharge: 2017-04-19 | Disposition: A | Discharge status: Home / Self Care | Payer: BLUE CROSS/BLUE SHIELD | Source: Ambulatory Visit | Attending: Urology | Admitting: Urology

## 2017-04-19 DIAGNOSIS — N201 Calculus of ureter: Secondary | ICD-10-CM

## 2017-04-19 DIAGNOSIS — Z87442 Personal history of urinary calculi: Secondary | ICD-10-CM | POA: Insufficient documentation

## 2017-04-19 DIAGNOSIS — I493 Ventricular premature depolarization: Secondary | ICD-10-CM | POA: Insufficient documentation

## 2017-04-19 HISTORY — PX: EXTRACORPOREAL SHOCK WAVE LITHOTRIPSY: SHX1557

## 2017-04-19 SURGERY — LITHOTRIPSY, ESWL
Anesthesia: LOCAL | Laterality: Right

## 2017-04-19 MED ORDER — DIPHENHYDRAMINE HCL 25 MG PO CAPS
25.0000 mg | ORAL_CAPSULE | ORAL | Status: AC
  Administered 2017-04-19: 25 mg via ORAL
  Filled 2017-04-19: qty 1

## 2017-04-19 MED ORDER — SODIUM CHLORIDE 0.9 % IV SOLN
INTRAVENOUS | Status: DC
  Administered 2017-04-19: 07:00:00 via INTRAVENOUS

## 2017-04-19 MED ORDER — CIPROFLOXACIN HCL 500 MG PO TABS
500.0000 mg | ORAL_TABLET | ORAL | Status: AC
  Administered 2017-04-19: 500 mg via ORAL
  Filled 2017-04-19: qty 1

## 2017-04-19 MED ORDER — DIAZEPAM 5 MG PO TABS
10.0000 mg | ORAL_TABLET | ORAL | Status: AC
  Administered 2017-04-19: 10 mg via ORAL
  Filled 2017-04-19: qty 2

## 2017-04-19 NOTE — Op Note (Signed)
See [facility] OP note scanned into chart. Also because of the size, density, location and other factors that cannot be anticipated I feel this will likely be a staged procedure. This fact supersedes any indication in the scanned [facility] operative note to the contrary.  

## 2017-04-20 ENCOUNTER — Encounter (HOSPITAL_COMMUNITY): Payer: Self-pay | Admitting: Urology

## 2017-05-04 ENCOUNTER — Other Ambulatory Visit: Payer: BLUE CROSS/BLUE SHIELD

## 2017-05-04 DIAGNOSIS — R899 Unspecified abnormal finding in specimens from other organs, systems and tissues: Secondary | ICD-10-CM

## 2017-05-05 LAB — CBC
Hematocrit: 38.7 % (ref 34.0–46.6)
Hemoglobin: 12.7 g/dL (ref 11.1–15.9)
MCH: 28.1 pg (ref 26.6–33.0)
MCHC: 32.8 g/dL (ref 31.5–35.7)
MCV: 86 fL (ref 79–97)
Platelets: 225 10*3/uL (ref 150–379)
RBC: 4.52 x10E6/uL (ref 3.77–5.28)
RDW: 14.1 % (ref 12.3–15.4)
WBC: 4.2 10*3/uL (ref 3.4–10.8)

## 2017-05-05 LAB — CREATININE, SERUM
Creatinine, Ser: 0.95 mg/dL (ref 0.57–1.00)
GFR calc Af Amer: 80 mL/min/{1.73_m2} (ref >59)
GFR calc non Af Amer: 70 mL/min/{1.73_m2} (ref >59)

## 2017-05-05 LAB — IRON AND TIBC
Iron Saturation: 21 % (ref 15–55)
Iron: 61 ug/dL (ref 27–159)
Total Iron Binding Capacity: 293 ug/dL (ref 250–450)
UIBC: 232 ug/dL (ref 131–425)

## 2017-05-05 LAB — ALT: ALT: 18 IU/L (ref 0–32)

## 2017-05-08 ENCOUNTER — Other Ambulatory Visit: Payer: Self-pay

## 2017-05-09 ENCOUNTER — Other Ambulatory Visit: Payer: Self-pay | Admitting: Certified Nurse Midwife

## 2017-05-09 DIAGNOSIS — N952 Postmenopausal atrophic vaginitis: Secondary | ICD-10-CM

## 2017-05-09 NOTE — Telephone Encounter (Signed)
Medication refill request: Estradiol ( 90 day supply for mail in pharmacy)  Last AEX:  04-06-17  Next AEX: 04-12-18  Last MMG (if hormonal medication request): 08-01-16 WNL   Refill authorized: please advise

## 2017-08-25 ENCOUNTER — Encounter: Payer: Self-pay | Admitting: Certified Nurse Midwife

## 2017-08-27 ENCOUNTER — Telehealth: Payer: Self-pay | Admitting: Certified Nurse Midwife

## 2017-08-27 DIAGNOSIS — N952 Postmenopausal atrophic vaginitis: Secondary | ICD-10-CM

## 2017-08-27 MED ORDER — ESTRADIOL 10 MCG VA TABS: ORAL_TABLET | VAGINAL | 2 refills | Status: DC

## 2017-08-27 NOTE — Telephone Encounter (Signed)
-----   Message from [place], Generic sent at 08/25/2017 2:02 PM EDT -----    Hello!  I've changed insurance and need to get my estradiol vaginal inserts refilled under the new mail order plan. I now have [place] [facility]. I've attached my new cards and the med they have which is Yuvafem. I've used it previously. I have about 3 weeks worth left.     Let me know if there's anything else you need.     Thank you. Hope you're having a nice summer!    [employer] card printed, scanned to patient's chart, and entered. Routing to refills.

## 2017-08-27 NOTE — Telephone Encounter (Signed)
Rx sent to [facility].  Patient current on annual exam.   Encounter closed.

## 2017-08-28 ENCOUNTER — Ambulatory Visit: Payer: 59 | Admitting: Certified Nurse Midwife

## 2017-08-28 ENCOUNTER — Encounter: Payer: Self-pay | Admitting: Certified Nurse Midwife

## 2017-08-28 ENCOUNTER — Other Ambulatory Visit: Payer: Self-pay

## 2017-08-28 VITALS — BP 116/64 | HR 68 | Resp 16 | Ht 67.5 in | Wt 133.0 lb

## 2017-08-28 DIAGNOSIS — T148XXA Other injury of unspecified body region, initial encounter: Secondary | ICD-10-CM

## 2017-08-28 DIAGNOSIS — N952 Postmenopausal atrophic vaginitis: Secondary | ICD-10-CM | POA: Diagnosis not present

## 2017-08-28 DIAGNOSIS — N898 Other specified noninflammatory disorders of vagina: Secondary | ICD-10-CM

## 2017-08-28 DIAGNOSIS — Z01419 Encounter for gynecological examination (general) (routine) without abnormal findings: Secondary | ICD-10-CM | POA: Diagnosis not present

## 2017-08-28 NOTE — Progress Notes (Signed)
52 y.o. Married Caucasian female [id number] here with complaint of vaginal symptoms of slight and  Itching, no odor with gray discharge with slight pink tinged. Denies vaginal burning. Onset of symptoms 2  days ago. Denies new personal products. Vaginal dryness is much improved.. Urinary symptoms none . Menopausal using vagifem, last dose 4 days ago.Has not noted any changes with use. Noted slight crampy pain after intercourse, quickly disappeared. No other health issues today.  Review of Systems  Constitutional: Negative.   HENT: Negative.   Eyes: Negative.   Respiratory: Negative.   Cardiovascular: Negative.   Gastrointestinal: Negative.   Genitourinary:       Abnormal discharge  Musculoskeletal: Negative.   Skin: Negative.   Neurological: Negative.   Endo/Heme/Allergies: Negative.   Psychiatric/Behavioral: Negative.     O:Healthy female WDWN Affect: normal, orientation x 3  Exam:Skin: warm and dry Abdomen: Soft, non tender, no masses  Inguinal Lymph nodes: no enlargement or tenderness Pelvic exam: External genital: normal female, no lesions or scaling or exudate BUS: negative Vagina: white creamy non odorous discharge noted. No blood noted or pink color discharge. At introitus small excoriated area which is slightly red and healing superficial laceration noted. Shown to patient in mirror, very slightly tender.  Affirm taken Cervix: normal, non tender, no CMT, no blood  noted Uterus: normal, non tender Adnexa:normal, non tender, no masses or fullness noted   A:Normal pelvic exam Menopausal with atrophic vaginitis with Vagifem working well  R/O vaginal infection Small introital superficial laceration felt to be source of streak of pink that patient noted .   P:Discussed findings of normal pelvic exam and vaginal superficial laceration  and etiology. Discussed continue to use Vagifem as prescribed atrophy improved. pads for extended period of time. Discussed using vaginal  lubricant or moisture for sexual activity to prevent vaginal excoriation or superficial lacerations. Questions addressed. Lab: affirm will treat if indicated.  Rv prn

## 2017-08-29 LAB — VAGINITIS/VAGINOSIS, DNA PROBE
Candida Species: NEGATIVE
Gardnerella vaginalis: NEGATIVE
Trichomonas vaginosis: NEGATIVE

## 2017-11-13 ENCOUNTER — Telehealth: Payer: Self-pay | Admitting: Pulmonary Disease

## 2017-11-13 DIAGNOSIS — R918 Other nonspecific abnormal finding of lung field: Secondary | ICD-10-CM

## 2017-11-13 NOTE — Telephone Encounter (Signed)
Instructions from pt's last OV:  We will schedule you for CT without on contrast in 1 year. Follow up in clinic after scan.     Last CT was performed 10/26/16. Pt was last seen by Dr. Vaughan Browner 10/21/15.  Called and spoke with pt letting her know the information stated from the last OV she saw Dr. Vaughan Browner at. Stated to pt I would check with Dr. Vaughan Browner to see if he wanted her to have a scan this year due to the last one that was done being last year and then follow up with a visit. Pt expressed understanding.  Dr. Vaughan Browner, please advise if we need to have pt have a scan this year and then an OV or if her scan that was performed 10/26/16 would be okay? Thanks!

## 2017-11-14 NOTE — Telephone Encounter (Signed)
Spoke with pt. She is aware that Dr. Vaughan Browner wants her to have a repeat CT. This has been ordered. Pt will schedule her ROV once her CT is scheduled. Nothing further was needed.

## 2017-11-14 NOTE — Telephone Encounter (Signed)
Yes. She will need a CT chest without contrast for low risk lung nodules this year (2019) before clinic visit.  Please order

## 2017-11-16 ENCOUNTER — Other Ambulatory Visit: Payer: Self-pay | Admitting: Pulmonary Disease

## 2017-11-16 DIAGNOSIS — R918 Other nonspecific abnormal finding of lung field: Secondary | ICD-10-CM

## 2017-11-21 ENCOUNTER — Ambulatory Visit (HOSPITAL_BASED_OUTPATIENT_CLINIC_OR_DEPARTMENT_OTHER): Payer: 59

## 2017-11-21 ENCOUNTER — Ambulatory Visit (INDEPENDENT_AMBULATORY_CARE_PROVIDER_SITE_OTHER): Payer: 59

## 2017-11-21 ENCOUNTER — Other Ambulatory Visit: Payer: 59

## 2017-11-21 DIAGNOSIS — R918 Other nonspecific abnormal finding of lung field: Secondary | ICD-10-CM

## 2017-11-28 ENCOUNTER — Encounter: Payer: Self-pay | Admitting: Pulmonary Disease

## 2017-11-28 ENCOUNTER — Ambulatory Visit (INDEPENDENT_AMBULATORY_CARE_PROVIDER_SITE_OTHER): Payer: 59 | Admitting: Pulmonary Disease

## 2017-11-28 VITALS — BP 118/78 | HR 62 | Ht 67.0 in | Wt 135.6 lb

## 2017-11-28 DIAGNOSIS — R918 Other nonspecific abnormal finding of lung field: Secondary | ICD-10-CM

## 2017-11-28 NOTE — Progress Notes (Signed)
Laura Ritter    [id number]    12/14/1965  Primary Care Physician:Kuneff, Reinaldo Raddle, DO  Referring Physician: Ma Hillock, DO [address] [place], [place] [id number]  Chief complaint:  Follow up for abnormal CT scan.  HPI: Laura Ritter is a 52 Y/O with PMH of renal stones, migraines. She has H/O multiple pulmonary nodules dating back to 2010. They were first noted in 2010 during a CT of the abd. She has multiple follow CT scans that showed stability and atleast one PET scan that showed mild activity. She was being followed by pulmonay at [facility] and had a workup including immunology, CTD and serologies for fungal diseases.    She was seen by cardiology for palpitations but no intervention was needed. She was born in [place] but lived in [place] for most of her life. No significant travel history. No family history of lung disease and no symptoms suggestive of autoimmune or CTD. She is a non smoker and is uptodate with her age appropriate cancer screening. 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 No known exposures at work and home.  Interim History: She feels well with no respiratory complaints. She's had a CT scan which showed very mild enlargement of her nodules. A subsequent PET scan showed evidence low level activity.  Outpatient Encounter Medications as of 11/28/2017  Medication Sig  . Calcium Carbonate-Vitamin D (CALCIUM 600+D) 600-400 MG-UNIT tablet Take 1 tablet by mouth 2 (two) times daily.  . Cholecalciferol (VITAMIN D-3 PO) Take 2,000 Units by mouth 2 (two) times daily.   . Estradiol 10 MCG TABS vaginal tablet INSERT 1 TABLET VAGINALLY TWICE WEEKLY  . Multiple Vitamins-Minerals (MULTIVITAMIN PO) Take by mouth.  Marland Kitchen XIIDRA 5 % SOLN Place 1 drop into both eyes 2 (two) times daily.   No facility-administered encounter medications on file as of 11/28/2017.    Physical Exam: Blood pressure 118/78, pulse 62, height 5\' 7"   (1.702 m), weight 135 lb 9.6 oz (61.5 kg), last menstrual period 01/12/2014, SpO2 98 %. Gen:      No acute distress HEENT:  EOMI, sclera anicteric Neck:     No masses; no thyromegaly Lungs:    Clear to auscultation bilaterally; normal respiratory effort CV:         Regular rate and rhythm; no murmurs Abd:      + bowel sounds; soft, non-tender; no palpable masses, no distension Ext:    No edema; adequate peripheral perfusion Skin:      Warm and dry; no rash Neuro: alert and oriented x 3 Psych: normal mood and affect  Data Reviewed: CT abd 05/15/08- B/L basal lung nodules CT scan chest 09/11/08- Multiple B/L pulmonary nodule ranging from 5-13 PET scan 09/30/08- Pulmonary nodules with mildly elevated activity. CT scan chest 04/01/09- Persistent pulmonary nodules. PET scan 04/09/09- Negative as per patient. No report available to review CT scan chest 10/14/09- Stable pulmonary nodules CT scan chest 09/22/10- Stable pulmonary nodules CT scan chest 03/18/15- Pulmonary nodules some larger and some smaller compared to 2010. Largest is 10cm CT scan chest 10/01/15- Multiple pulmonary nodules. Some with small enlargement. Largest is 11 mm.  PET Scan 10/20/15- Low level uptake. CT scan 11/21/2017- right lower lobe nodule has slightly enlarged.  Additional bilateral pulmonary nodules within indolent growth. I have reviewed the images personally.  Serology ACE 07/07/13- 52, 2/617- 54 ANA 07/13/14- Neg RA, CCP 07/13/14- Neg  Sed rate 07/13/14- 6, 03/08/15- 1  Assessment:  Multiple pulmonary nodules. These have been largely stable since 2010 with mild uptake in activity on PET scan in 2010. CT scans shows a waxing and waning pattern indicative of a inflammatory nature. [place] and [place] that was negative and sed rate is low. Sarcoid is possible but ACE level is only very mildly elevated   Reviewed the last CT scan which shows that the right lower lobe nodule is slightly bigger.  Cannot  rule out low-grade adenocarcinoma Given these findings I have recommended a PET scan.  She would like to hold off until she can go on the new insurance We will also get blood test for Biodesix nodify xl2 study to get more information on the lung nodule,   Plan/Recommendations: - Biodesix nodify xl2 blood test  Marshell Garfinkel MD [facility] 11/28/2017, 9:14 AM  CC: Kuneff, Renee A, DO

## 2017-11-28 NOTE — Patient Instructions (Signed)
I have reviewed your CT scan which shows small growth in the nodule in the right lung.  The rest of the nodules appear stable Given this change over the past year I would like to evaluate the nodule again We will get a blood test today to get further information about the risk for cancer I would recommend getting a PET scan.  Please check with insurance about coverage and let us know if you like to get it done now or after your new insurance has started  Follow-up in mid January to review again.

## 2017-12-20 ENCOUNTER — Telehealth: Payer: Self-pay | Admitting: Pulmonary Disease

## 2017-12-20 NOTE — Telephone Encounter (Signed)
Biodesix results Nodify XL2 11/28/17 Results reviewed.  Posttest probability of malignancy less than 1%. Will be scanned into computer  Let the patient know that the blood test shows that the lung nodules  very low risk of malignancy.  Marshell Garfinkel MD [facility] 12/20/2017, 3:47 PM

## 2017-12-21 NOTE — Telephone Encounter (Signed)
Spoke with pt, aware of results.  Pt wants to know if she needs to keep her January appt and proceed with her PET scan?  I do not see where a PET was ordered by our office, no ct scheduled at this time either.  Dr. Vaughan Browner please advise.  Thanks.

## 2017-12-21 NOTE — Telephone Encounter (Signed)
Patient returned phone call; pt contact # [phone]

## 2017-12-21 NOTE — Telephone Encounter (Signed)
lmtcb x1 for pt. Results have been placed in Dr. Matilde Bash scan folder to be scanned into chart.

## 2018-01-03 NOTE — Telephone Encounter (Signed)
I called and left a voice message to discuss test results and left a voice mail requesting a call back.  I will discuss with her when she is available to speak on the telephone.

## 2018-01-03 NOTE — Telephone Encounter (Signed)
Spoke with pt, states that she is available to speak either before 1:00 or after 3:30 to speak with Dr. Vaughan Browner.  Dr. Vaughan Browner please advise.  Thanks.

## 2018-01-03 NOTE — Telephone Encounter (Signed)
Patient returned phone call; pt contact # [phone]

## 2018-01-03 NOTE — Telephone Encounter (Signed)
lmtcb for pt. Need to find out when pt is available to speak with Dr. Vaughan Browner- he is in clinic all day today.

## 2018-01-03 NOTE — Telephone Encounter (Signed)
Pt is calling back [phone]

## 2018-01-04 NOTE — Telephone Encounter (Signed)
Per our records PET scan has already been canceled, and pending CT w/o has been ordered for 1y. Nothing further is needed.

## 2018-01-04 NOTE — Telephone Encounter (Signed)
Called and discussed with patient She has had waxing and waning lung nodules since 2010.  With low risk (<1%) on biodesix study we can cancel the PET scan. I have advised to follow-up CT scan without contrast in 1 year time.

## 2018-01-04 NOTE — Telephone Encounter (Signed)
Dr. Vaughan Browner, please advise on this. Thanks!

## 2018-01-04 NOTE — Telephone Encounter (Signed)
Patient called back; will be out of work for the rest day and can take his call; contact # [phone]

## 2018-02-01 ENCOUNTER — Ambulatory Visit: Payer: No Typology Code available for payment source | Admitting: Sports Medicine

## 2018-02-01 ENCOUNTER — Encounter: Payer: Self-pay | Admitting: Sports Medicine

## 2018-02-01 VITALS — BP 102/70 | HR 67 | Ht 67.0 in | Wt 137.4 lb

## 2018-02-01 DIAGNOSIS — M9905 Segmental and somatic dysfunction of pelvic region: Secondary | ICD-10-CM

## 2018-02-01 DIAGNOSIS — M479 Spondylosis, unspecified: Secondary | ICD-10-CM

## 2018-02-01 DIAGNOSIS — M546 Pain in thoracic spine: Secondary | ICD-10-CM

## 2018-02-01 DIAGNOSIS — M9903 Segmental and somatic dysfunction of lumbar region: Secondary | ICD-10-CM

## 2018-02-01 DIAGNOSIS — M9904 Segmental and somatic dysfunction of sacral region: Secondary | ICD-10-CM

## 2018-02-01 DIAGNOSIS — M9908 Segmental and somatic dysfunction of rib cage: Secondary | ICD-10-CM | POA: Diagnosis not present

## 2018-02-01 DIAGNOSIS — M9902 Segmental and somatic dysfunction of thoracic region: Secondary | ICD-10-CM

## 2018-02-01 DIAGNOSIS — R918 Other nonspecific abnormal finding of lung field: Secondary | ICD-10-CM

## 2018-02-01 DIAGNOSIS — G8929 Other chronic pain: Secondary | ICD-10-CM | POA: Diagnosis not present

## 2018-02-01 MED ORDER — METHYLPREDNISOLONE 4 MG PO TBPK: ORAL_TABLET | ORAL | 0 refills | Status: DC

## 2018-02-01 NOTE — Patient Instructions (Addendum)
Also check out [employer]" which is a program developed by Dr. Minerva Ends.   There are links to a couple of his YouTube Videos below and I would like to see you performing one of his videos 5-6 days per week.  It is best to do these exercises first thing in the morning.  They will give you a good jumpstart here today and start normalizing the way you move.  A good intro video is: "Independence from Pain 7-minute Video" - [email or web address].com   A more advanced video is: Interior and spatial designer original 12 minutes" - [email or web address].king-greer.com/  Exercises that focus more on the neck are as below: Dr. Archie Balboa with [employer] teaching neck and shoulder details Part 1 - https://youtu.be/[email or web address] Part 2 Dr. Archie Balboa with [facility] quick routine to practice daily - https://youtu.be/[email or web address]  Do not try to attempt the entire video when first beginning.  Try breaking of each exercise that he goes into shorter segments.  In other words, if they perform an exercise for 45 seconds, start with 15 seconds and rest and then resume when they begin the new activity.  If you work your way up to being able to do these videos without having to stop, I expect you will see significant improvements in your pain.  If you enjoy his videos and would like to find out more you can look on his website: [email or web address]  He has a workout streaming option as well as a DVD set available for purchase.  Amazon has the best price for his DVDs.     Please perform the exercise program that we have prepared for you and gone over in detail on a daily basis.  In addition to the handout you were provided you can access your program through: [email or web address]-[email or web address]   Your unique program code is:  [email or web address]

## 2018-02-01 NOTE — Progress Notes (Signed)
Laura Ritter. Laura Ritter, [facility]  [place] - 53 y.o. female MRN [id number]  Date of birth: 1965-12-17  Visit Date: 02/01/2018  PCP: Ma Hillock, DO   Referred by: Ma Hillock, DO   SUBJECTIVE:  Chief Complaint  Patient presents with  . Initial Assessment  . upper back pain    sx started yesterday.     HPI: Patient presents with acute onset of upper to mid back pain after driving to [facility] for basketball game.  She is been having muscle spasms on the right greater than left since this occurred.  She is having a hard time sitting straight up.  It is worsened with driving raising up turning in bed and taking deep breaths.  It radiates to the right side.  She is tried using ibuprofen and Tylenol as well as stretching and TENS units with only minimal improvement.  REVIEW OF SYSTEMS: She is having nighttime disturbances otherwise 12 point review of systems reviewed and is negative.  HISTORY:  Prior history reviewed and updated per electronic medical record.  Social History   Occupational History  . Occupation: Physical Therapist    Employer: [employer]  Tobacco Use  . Smoking status: Never Smoker  . Smokeless tobacco: Never Used  Substance and Sexual Activity  . Alcohol use: Not Currently  . Drug use: No  . Sexual activity: Yes    Partners: Male    Birth control/protection: Post-menopausal    Comment: husband vasectomy   Social History   Social History Narrative   Ms. Reif lives with her husband & 3 children. 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 She is from [place].    Past Medical History:  Diagnosis Date  . Migraines   . PVC (premature ventricular contraction) 04/01/2015  . Renal calculi 4/09   followed yearly follow up- Dr. Jeffie Pollock  . Screening for HIV (human immunodeficiency virus)    Past Surgical History:  Procedure Laterality Date  . BACK  SURGERY  1/16   herniated disc  . CESAREAN SECTION     x3  . CHOLECYSTECTOMY  2001  . EXTRACORPOREAL SHOCK WAVE LITHOTRIPSY Right 04/19/2017   Procedure: RIGHT EXTRACORPOREAL SHOCK WAVE LITHOTRIPSY (ESWL);  Surgeon: Ceasar Mons, MD;  Location: [facility];  Service: Urology;  Laterality: Right;  . LAPAROSCOPIC CHOLECYSTECTOMY  2001  . REFRACTIVE SURGERY Bilateral 2006   lasik  . SPINE SURGERY     family history includes Alcohol abuse in her brother, father, and mother; Alzheimer's disease in her mother; Arthritis in her mother; Hyperlipidemia in her mother; Hypertension in her father; Kidney Stones in her brother; Migraines in her son; Parkinson's disease in her father.  DATA OBTAINED & REVIEWED:  Recent Labs    04/06/17 1405 05/04/17 1607  CALCIUM 8.8  --   AST 31  --   ALT 35* 18  TSH 1.150  --    No problems updated. No specialty comments available.  OBJECTIVE:  VS:  HT:5\' 7"  (170.2 cm)   WT:137 lb 6.4 oz (62.3 kg)  BMI:21.51    BP:102/70  HR:67bpm  TEMP: ( )  RESP:97 %   PHYSICAL EXAM:    ASSESSMENT   1. Chronic right-sided thoracic back pain   2. Somatic dysfunction of thoracic region   3. Somatic dysfunction of lumbar region   4. Somatic dysfunction of rib cage region   5.  Somatic dysfunction of pelvis region   6. Somatic dysfunction of sacral region   7. Spondylosis   8. Multiple pulmonary nodules   9. Abnormal CT scan, lung     PLAN:  Pertinent additional documentation may be included in corresponding procedure notes, imaging studies, problem based documentation and patient instructions.  Procedures:  PROCEDURE NOTE: THERAPEUTIC EXERCISES [phone])  Discussed the foundation of treatment for this condition is physical therapy and/or daily (5-6 days/week) therapeutic exercises, focusing on core strengthening, coordination, neuromuscular control/reeducation. 15 minutes spent for Therapeutic exercises as below and as referenced in the AVS. This  included exercises focusing on stretching, strengthening, with significant focus on eccentric aspects.  Proper technique shown and discussed handout in great detail with ATC. All questions were discussed and answered.  Long term goals include an improvement in range of motion, strength, endurance as well as avoiding reinjury. Frequency of visits is one time as determined during today's office visit. Frequency of exercises to be performed is as per handout. EXERCISES REVIEWED: Archie Balboa Exercises Scapular Stabilization Thoracic Mobility  PROCEDURE NOTE: OSTEOPATHIC MANIPULATION  The decision today to treat with Osteopathic Manipulative Therapy (OMT) was based on physical exam findings. Verbal consent was obtained following a discussion with the patient regarding the of risks, benefits and potential side effects, including an acute pain flare,post manipulation soreness and need for repeat treatments.   Contraindications to OMT: NONE Manipulation was performed as below: [facility] & Osteopathic Exam Findings THORACIC SPINE: T2 - 8 Neutral, Rotated LEFT, Sidebent RIGHT RIBS: Rib 5 Right  Posterior LUMBAR SPINE: L4 FRS right (Flexed, Rotated & Sidebent) PELVIS: Right psoas spasm Right anterior innonimate SACRUM: L on L sacral torsion  OMT Techniques Used HVLA muscle energy myofascial release The patient tolerated the treatment well and reported Improved symptoms following treatment today. Patient was given medications, exercises, stretches and lifestyle modifications per AVS and verbally.    Medications:  Meds ordered this encounter  Medications  . methylPREDNISolone (MEDROL DOSEPAK) 4 MG TBPK tablet    Sig: Take by mouth as directed. Take 6 tablets on the first day prescribed then as directed.    Dispense:  21 tablet    Refill:  0    Discussion/Instructions: Acute muscle spasms likely secondary to prolonged sitting.  She responded well to osteopathic  manipulation.   THERAPEUTIC EXERCISE: Discussed the foundation of treatment for this condition is physical therapy and/or daily (5-6 days/week) therapeutic exercises, focusing on core strengthening, coordination, neuromuscular control/reeducation.  Home Therapeutic exercises prescribed today per procedure note. Links to [employer] provided today per Patient Instructions.  These exercises were developed by Minerva Ends, DC with a strong emphasis on core neuromuscular reducation and postural realignment through body-weight exercises. Discussed the underlying features of tight hip flexors leading to crouched, fetal like position that results in spinal column compression.  Including lumbar hyperflexion with hypermobility, thoracic flexion with restrictive rotation and cervical lordosis reversal RICE (Rest, ICE, Compression, Elevation) principles reviewed with the patient.    Return in about 2 weeks (around 02/15/2018) for consideration of repeat Osteopathic Manipulation.          Gerda Diss, [facility] Physician

## 2018-02-10 ENCOUNTER — Encounter: Payer: Self-pay | Admitting: Sports Medicine

## 2018-02-15 ENCOUNTER — Ambulatory Visit: Payer: No Typology Code available for payment source | Admitting: Sports Medicine

## 2018-02-27 ENCOUNTER — Ambulatory Visit: Payer: 59 | Admitting: Pulmonary Disease

## 2018-03-13 ENCOUNTER — Other Ambulatory Visit: Payer: Self-pay | Admitting: Certified Nurse Midwife

## 2018-03-13 DIAGNOSIS — Z1231 Encounter for screening mammogram for malignant neoplasm of breast: Secondary | ICD-10-CM

## 2018-03-20 ENCOUNTER — Ambulatory Visit (INDEPENDENT_AMBULATORY_CARE_PROVIDER_SITE_OTHER): Payer: No Typology Code available for payment source

## 2018-03-20 DIAGNOSIS — Z1231 Encounter for screening mammogram for malignant neoplasm of breast: Secondary | ICD-10-CM

## 2018-03-29 ENCOUNTER — Other Ambulatory Visit: Payer: Self-pay | Admitting: Certified Nurse Midwife

## 2018-03-29 DIAGNOSIS — N952 Postmenopausal atrophic vaginitis: Secondary | ICD-10-CM

## 2018-04-12 ENCOUNTER — Ambulatory Visit (INDEPENDENT_AMBULATORY_CARE_PROVIDER_SITE_OTHER): Payer: No Typology Code available for payment source | Admitting: Certified Nurse Midwife

## 2018-04-12 ENCOUNTER — Other Ambulatory Visit (HOSPITAL_COMMUNITY)
Admission: RE | Admit: 2018-04-12 | Discharge: 2018-04-12 | Disposition: A | Discharge status: Home / Self Care | Payer: No Typology Code available for payment source | Source: Ambulatory Visit | Attending: Certified Nurse Midwife | Admitting: Certified Nurse Midwife

## 2018-04-12 ENCOUNTER — Encounter: Payer: Self-pay | Admitting: Certified Nurse Midwife

## 2018-04-12 ENCOUNTER — Other Ambulatory Visit: Payer: Self-pay

## 2018-04-12 VITALS — BP 90/60 | HR 68 | Resp 16 | Ht 67.0 in | Wt 136.0 lb

## 2018-04-12 DIAGNOSIS — Z01419 Encounter for gynecological examination (general) (routine) without abnormal findings: Secondary | ICD-10-CM

## 2018-04-12 DIAGNOSIS — Z124 Encounter for screening for malignant neoplasm of cervix: Secondary | ICD-10-CM | POA: Diagnosis present

## 2018-04-12 DIAGNOSIS — N952 Postmenopausal atrophic vaginitis: Secondary | ICD-10-CM | POA: Diagnosis not present

## 2018-04-12 MED ORDER — ESTRADIOL 10 MCG VA TABS: ORAL_TABLET | VAGINAL | 3 refills | Status: DC

## 2018-04-12 NOTE — Progress Notes (Signed)
53 y.o. G65P3003 Married  Caucasian Fe here for annual exam. Menopausal no vaginal bleeding or vaginal dryness. Had episode of hot flashes and night sweats with palpitations, was evaluated with cardiology, benign. Feeling well, no health issues. Declines labs today. Eating well and exercising. Planning trip to see family in [place].  Patient's last menstrual period was 01/12/2014 (lmp unknown).          Sexually active: Yes.    The current method of family planning is vasectomy.    Exercising: Yes.    cardio & weights Smoker:  no  Review of Systems  Constitutional: Negative.   HENT: Negative.   Eyes: Negative.   Respiratory: Negative.   Cardiovascular: Negative.   Gastrointestinal: Negative.   Genitourinary: Negative.   Musculoskeletal: Negative.   Skin: Negative.   Neurological: Negative.   Endo/Heme/Allergies: Negative.   Psychiatric/Behavioral: Negative.     Health Maintenance: Pap:  04-05-16 neg History of Abnormal Pap: no MMG:  03-20-2018 category b density birads 1:neg Self Breast exams: no Colonoscopy:  2018 f/u 4yrs BMD:   none TDaP:  2018 Shingles: no Pneumonia: no Hep C and HIV: both neg in the past Labs: no   reports that she has never smoked. She has never used smokeless tobacco. She reports previous alcohol use. She reports that she does not use drugs.  Past Medical History:  Diagnosis Date  . Migraines   . PVC (premature ventricular contraction) 04/01/2015  . Renal calculi 4/09   followed yearly follow up- Dr. Jeffie Pollock  . Screening for HIV (human immunodeficiency virus)     Past Surgical History:  Procedure Laterality Date  . BACK SURGERY  1/16   herniated disc  . CESAREAN SECTION     x3  . CHOLECYSTECTOMY  2001  . EXTRACORPOREAL SHOCK WAVE LITHOTRIPSY Right 04/19/2017   Procedure: RIGHT EXTRACORPOREAL SHOCK WAVE LITHOTRIPSY (ESWL);  Surgeon: Ceasar Mons, MD;  Location: [facility];  Service: Urology;  Laterality: Right;  . LAPAROSCOPIC  CHOLECYSTECTOMY  2001  . REFRACTIVE SURGERY Bilateral 2006   lasik  . SPINE SURGERY      Current Outpatient Medications  Medication Sig Dispense Refill  . Calcium Carbonate-Vitamin D (CALCIUM 600+D) 600-400 MG-UNIT tablet Take 1 tablet by mouth 2 (two) times daily.    . Cholecalciferol (VITAMIN D-3 PO) Take 2,000 Units by mouth 2 (two) times daily.     . Estradiol 10 MCG TABS vaginal tablet INSERT 1 TABLET VAGINALLY TWICE WEEKLY 24 tablet 2  . methylPREDNISolone (MEDROL DOSEPAK) 4 MG TBPK tablet Take by mouth as directed. Take 6 tablets on the first day prescribed then as directed. 21 tablet 0  . Multiple Vitamins-Minerals (MULTIVITAMIN PO) Take by mouth.    Marland Kitchen XIIDRA 5 % SOLN Place 1 drop into both eyes 2 (two) times daily.     No current facility-administered medications for this visit.     Family History  Problem Relation Age of Onset  . Hyperlipidemia Mother   . Alcohol abuse Mother   . Arthritis Mother   . Alzheimer's disease Mother   . Hypertension Father   . Parkinson's disease Father   . Alcohol abuse Father   . Kidney Stones Brother   . Alcohol abuse Brother   . Migraines Son     ROS:  Pertinent items are noted in HPI.  Otherwise, a comprehensive ROS was negative.  Exam:   LMP 01/12/2014 (LMP Unknown)    Ht Readings from Last 3 Encounters:  02/01/18 5\' 7"  (1.702  m)  11/28/17 5\' 7"  (1.702 m)  08/28/17 5' 7.5" (1.715 m)    General appearance: alert, cooperative and appears stated age Head: Normocephalic, without obvious abnormality, atraumatic Neck: no adenopathy, supple, symmetrical, trachea midline and thyroid normal to inspection and palpation Lungs: clear to auscultation bilaterally Breasts: normal appearance, no masses or tenderness, No nipple retraction or dimpling, No nipple discharge or bleeding, No axillary or supraclavicular adenopathy Heart: regular rate and rhythm Abdomen: soft, non-tender; no masses,  no organomegaly Extremities: extremities normal,  atraumatic, no cyanosis or edema Skin: Skin color, texture, turgor normal. No rashes or lesions Lymph nodes: Cervical, supraclavicular, and axillary nodes normal. No abnormal inguinal nodes palpated Neurologic: Grossly normal   Pelvic: External genitalia:  no lesions              Urethra:  normal appearing urethra with no masses, tenderness or lesions              Bartholin's and Skene's: normal                 Vagina: normal appearing vagina with normal color and discharge, no lesions              Cervix: multiparous appearance, no cervical motion tenderness and no lesions              Pap taken: Yes.   Bimanual Exam:  Uterus:  normal size, contour, position, consistency, mobility, non-tender and anteverted              Adnexa: normal adnexa and no mass, fullness, tenderness               Rectovaginal: Confirms               Anus:  normal sphincter tone, no lesions  Chaperone present: yes  A:  Well Woman with normal exam  Menopausal  Vaginal dryness with Vagifem working well   Heart palpitations with cardiology follow up negative finding  P:   Reviewed health and wellness pertinent to exam  Aware of need to evaluate if vaginal bleeding  Discussed risks/benefits/warning signs with Vagifem use, desires Rx  Rx Vagifem see order with instructions  Continue follow up with Cardiology as needed.  Pap smear: yes   counseled on breast self exam, mammography screening, feminine hygiene, adequate intake of calcium and vitamin D, diet and exercise  return annually or prn  An After Visit Summary was printed and given to the patient.

## 2018-04-12 NOTE — Patient Instructions (Signed)

## 2018-04-14 ENCOUNTER — Other Ambulatory Visit: Payer: Self-pay | Admitting: Certified Nurse Midwife

## 2018-04-14 DIAGNOSIS — N952 Postmenopausal atrophic vaginitis: Secondary | ICD-10-CM

## 2018-04-15 NOTE — Telephone Encounter (Signed)
Medication refill request: estradiol vag tabs  Last AEX:  04/12/18 DL Next AEX: 04/18/19 DL Last MMG (if hormonal medication request): 03/20/18 [id number]:Neg  Refill authorized: 04/12/18 #[id number]. To [facility]   Called patient. Prefers Rx to be sent to [facility]. Rx set to [facility] as prescribed 04/12/18.

## 2018-04-16 LAB — CYTOLOGY - PAP
Adequacy: ABSENT
Diagnosis: NEGATIVE
HPV: NOT DETECTED

## 2019-02-05 ENCOUNTER — Other Ambulatory Visit: Payer: Self-pay | Admitting: Certified Nurse Midwife

## 2019-02-05 DIAGNOSIS — N952 Postmenopausal atrophic vaginitis: Secondary | ICD-10-CM

## 2019-02-21 ENCOUNTER — Other Ambulatory Visit: Payer: Self-pay | Admitting: Certified Nurse Midwife

## 2019-02-21 DIAGNOSIS — N952 Postmenopausal atrophic vaginitis: Secondary | ICD-10-CM

## 2019-02-26 ENCOUNTER — Other Ambulatory Visit: Payer: Self-pay | Admitting: Certified Nurse Midwife

## 2019-02-26 DIAGNOSIS — Z1231 Encounter for screening mammogram for malignant neoplasm of breast: Secondary | ICD-10-CM

## 2019-03-27 ENCOUNTER — Ambulatory Visit: Payer: No Typology Code available for payment source

## 2019-04-16 ENCOUNTER — Ambulatory Visit: Payer: No Typology Code available for payment source

## 2019-04-17 ENCOUNTER — Other Ambulatory Visit: Payer: Self-pay

## 2019-04-17 ENCOUNTER — Ambulatory Visit (INDEPENDENT_AMBULATORY_CARE_PROVIDER_SITE_OTHER): Payer: No Typology Code available for payment source

## 2019-04-17 DIAGNOSIS — Z1231 Encounter for screening mammogram for malignant neoplasm of breast: Secondary | ICD-10-CM | POA: Diagnosis not present

## 2019-04-18 ENCOUNTER — Encounter: Payer: Self-pay | Admitting: Certified Nurse Midwife

## 2019-04-18 ENCOUNTER — Ambulatory Visit: Payer: No Typology Code available for payment source | Admitting: Certified Nurse Midwife

## 2019-04-18 ENCOUNTER — Other Ambulatory Visit: Payer: Self-pay

## 2019-04-18 VITALS — BP 100/64 | HR 70 | Temp 97.0°F | Resp 16 | Ht 66.75 in | Wt 132.0 lb

## 2019-04-18 DIAGNOSIS — E559 Vitamin D deficiency, unspecified: Secondary | ICD-10-CM | POA: Diagnosis not present

## 2019-04-18 DIAGNOSIS — Z01419 Encounter for gynecological examination (general) (routine) without abnormal findings: Secondary | ICD-10-CM

## 2019-04-18 DIAGNOSIS — Z0000 Encounter for general adult medical examination without abnormal findings: Secondary | ICD-10-CM

## 2019-04-18 DIAGNOSIS — N952 Postmenopausal atrophic vaginitis: Secondary | ICD-10-CM | POA: Diagnosis not present

## 2019-04-18 MED ORDER — ESTRADIOL 10 MCG VA TABS: ORAL_TABLET | VAGINAL | 3 refills | Status: DC

## 2019-04-18 NOTE — Progress Notes (Signed)
54 y.o. G44P3003 Married  Caucasian Fe here for annual exam. Menopausal, denies vaginal bleeding or vaginal dryness. Vagifem working well for dryness. Busy with work and family. 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 Plans Covid vaccine soon. Sees Renee Kuneff prn. Screening labs requested today. No other health issues today.     Patient's last menstrual period was 01/12/2014 (lmp unknown).          Sexually active: Yes.    The current method of family planning is vasectomy & postmenopausal.    Exercising: Yes.    weights, strength training Smoker:  no  Review of Systems  Constitutional: Negative.   HENT: Negative.   Eyes: Negative.   Respiratory: Negative.   Cardiovascular: Negative.   Gastrointestinal: Negative.   Genitourinary: Negative.   Musculoskeletal: Negative.   Skin: Negative.   Neurological: Negative.   Endo/Heme/Allergies: Negative.   Psychiatric/Behavioral: Negative.     Health Maintenance: Pap:  04-05-16 neg, 04-12-2018 neg HPV HR neg History of Abnormal Pap: no MMG: 04/17/2019 negative Self Breast exams: no Colonoscopy:  2018 f/u 21yrs BMD:   none TDaP:  2018 Shingles: no Pneumonia: no Hep C and HIV: both neg in the past Labs: yes   reports that she has never smoked. She has never used smokeless tobacco. She reports current alcohol use of about 3.0 standard drinks of alcohol per week. She reports that she does not use drugs.  Past Medical History:  Diagnosis Date  . Migraines   . PVC (premature ventricular contraction) 04/01/2015  . Renal calculi 4/09   followed yearly follow up- Dr. Jeffie Pollock  . Screening for HIV (human immunodeficiency virus)     Past Surgical History:  Procedure Laterality Date  . BACK SURGERY  1/16   herniated disc  . CESAREAN SECTION     x3  . CHOLECYSTECTOMY  2001  . EXTRACORPOREAL SHOCK WAVE LITHOTRIPSY Right 04/19/2017   Procedure: RIGHT EXTRACORPOREAL SHOCK WAVE LITHOTRIPSY (ESWL);  Surgeon: Ceasar Mons,  MD;  Location: [facility];  Service: Urology;  Laterality: Right;  . LAPAROSCOPIC CHOLECYSTECTOMY  2001  . REFRACTIVE SURGERY Bilateral 2006   lasik  . SPINE SURGERY      Current Outpatient Medications  Medication Sig Dispense Refill  . Calcium Carbonate-Vitamin D (CALCIUM 600+D) 600-400 MG-UNIT tablet Take 1 tablet by mouth 2 (two) times daily.    . Cholecalciferol (VITAMIN D-3 PO) Take 2,000 Units by mouth 2 (two) times daily.     . Estradiol 10 MCG TABS vaginal tablet INSERT 1 TABLET VAGINALLY  TWICE WEEKLY 24 tablet 3  . Multiple Vitamins-Minerals (MULTIVITAMIN PO) Take by mouth.    . Omega-3 Fatty Acids (FISH OIL PO) Take by mouth.     No current facility-administered medications for this visit.    Family History  Problem Relation Age of Onset  . Hyperlipidemia Mother   . Alcohol abuse Mother   . Arthritis Mother   . Alzheimer's disease Mother   . Hypertension Father   . Parkinson's disease Father   . Alcohol abuse Father   . Kidney Stones Brother   . Alcohol abuse Brother   . Migraines Son     ROS:  Pertinent items are noted in HPI.  Otherwise, a comprehensive ROS was negative.  Exam:   LMP 01/12/2014 (LMP Unknown)    Ht Readings from Last 3 Encounters:  04/12/18 5\' 7"  (1.702 m)  02/01/18 5\' 7"  (1.702 m)  11/28/17 5\' 7"  (1.702 m)  General appearance: alert, cooperative and appears stated age Head: Normocephalic, without obvious abnormality, atraumatic Neck: no adenopathy, supple, symmetrical, trachea midline and thyroid normal to inspection and palpation Lungs: clear to auscultation bilaterally Breasts: normal appearance, no masses or tenderness, No nipple retraction or dimpling, No nipple discharge or bleeding, No axillary or supraclavicular adenopathy Heart: regular rate and rhythm Abdomen: soft, non-tender; no masses,  no organomegaly Extremities: extremities normal, atraumatic, no cyanosis or edema Skin: Skin color, texture, turgor normal. No rashes or  lesions Lymph nodes: Cervical, supraclavicular, and axillary nodes normal. No abnormal inguinal nodes palpated Neurologic: Grossly normal   Pelvic: External genitalia:  no lesions              Urethra:  normal appearing urethra with no masses, tenderness or lesions              Bartholin's and Skene's: normal                 Vagina: normal appearing vagina with normal color and discharge, no lesions              Cervix: no cervical motion tenderness, no lesions and normal appearance              Pap taken: No. Bimanual Exam:  Uterus:  normal size, contour, position, consistency, mobility, non-tender and anteverted              Adnexa: normal adnexa and no mass, fullness, tenderness               Rectovaginal: Confirms               Anus:  normal sphincter tone, no lesions  Chaperone present: yes  A:  Well Woman with normal exam  Menopausal no HRT  Vagifem working well for vaginal dryness.  Screening labs    P:   Reviewed health and wellness pertinent to exam  Aware of need to advise if vaginal bleeding  Risks/benefits/warnning signs with use reviewed.   Rx Vagifem see order with instructions  Labs: CBC, CMP, Lipid panel, TSH, Vitamin D  Pap smear: no   counseled on breast self exam, mammography screening, feminine hygiene, menopause, adequate intake of calcium and vitamin D, diet and exercise  return annually or prn  An After Visit Summary was printed and given to the patient.

## 2019-04-18 NOTE — Patient Instructions (Addendum)
EXERCISE AND DIET:  We recommended that you start or continue a regular exercise program for good health. Regular exercise means any activity that makes your heart beat faster and makes you sweat.  We recommend exercising at least 30 minutes per day at least 3 days a week, preferably 4 or 5.  We also recommend a diet low in fat and sugar.  Inactivity, poor dietary choices and obesity can cause diabetes, heart attack, stroke, and kidney damage, among others.    ALCOHOL AND SMOKING:  Women should limit their alcohol intake to no more than 7 drinks/beers/glasses of wine (combined, not each!) per week. Moderation of alcohol intake to this level decreases your risk of breast cancer and liver damage. And of course, no recreational drugs are part of a healthy lifestyle.  And absolutely no smoking or even second hand smoke. Most people know smoking can cause heart and lung diseases, but did you know it also contributes to weakening of your bones? Aging of your skin?  Yellowing of your teeth and nails?  CALCIUM AND VITAMIN D:  Adequate intake of calcium and Vitamin D are recommended.  The recommendations for exact amounts of these supplements seem to change often, but generally speaking 600 mg of calcium (either carbonate or citrate) and 800 units of Vitamin D per day seems prudent. Certain women may benefit from higher intake of Vitamin D.  If you are among these women, your doctor will have told you during your visit.    PAP SMEARS:  Pap smears, to check for cervical cancer or precancers,  have traditionally been done yearly, although recent scientific advances have shown that most women can have pap smears less often.  However, every woman still should have a physical exam from her gynecologist every year. It will include a breast check, inspection of the vulva and vagina to check for abnormal growths or skin changes, a visual exam of the cervix, and then an exam to evaluate the size and shape of the uterus and  ovaries.  And after 54 years of age, a rectal exam is indicated to check for rectal cancers. We will also provide age appropriate advice regarding health maintenance, like when you should have certain vaccines, screening for sexually transmitted diseases, bone density testing, colonoscopy, mammograms, etc.   MAMMOGRAMS:  All women over 40 years old should have a yearly mammogram. Many facilities now offer a "3D" mammogram, which may cost around $50 extra out of pocket. If possible,  we recommend you accept the option to have the 3D mammogram performed.  It both reduces the number of women who will be called back for extra views which then turn out to be normal, and it is better than the routine mammogram at detecting truly abnormal areas.    COLONOSCOPY:  Colonoscopy to screen for colon cancer is recommended for all women at age 50.  We know, you hate the idea of the prep.  We agree, BUT, having colon cancer and not knowing it is worse!!  Colon cancer so often starts as a polyp that can be seen and removed at colonscopy, which can quite literally save your life!  And if your first colonoscopy is normal and you have no family history of colon cancer, most women don't have to have it again for 10 years.  Once every ten years, you can do something that may end up saving your life, right?  We will be happy to help you get it scheduled when you are ready.    Be sure to check your insurance coverage so you understand how much it will cost.  It may be covered as a preventative service at no cost, but you should check your particular policy.      Atrophic Vaginitis  Atrophic vaginitis is a condition in which the tissues that line the vagina become dry and thin. This condition is most common in women who have stopped having regular menstrual periods (are in menopause). This usually starts when a woman is 45-55 years old. That is the time when a woman's estrogen levels begin to drop (decrease). Estrogen is a female  hormone. It helps to keep the tissues of the vagina moist. It stimulates the vagina to produce a clear fluid that lubricates the vagina for sexual intercourse. This fluid also protects the vagina from infection. Lack of estrogen can cause the lining of the vagina to get thinner and dryer. The vagina may also shrink in size. It may become less elastic. Atrophic vaginitis tends to get worse over time as a woman's estrogen level drops. What are the causes? This condition is caused by the normal drop in estrogen that happens around the time of menopause. What increases the risk? Certain conditions or situations may lower a woman's estrogen level, leading to a higher risk for atrophic vaginitis. You are more likely to develop this condition if:  You are taking medicines that block estrogen.  You have had your ovaries removed.  You are being treated for cancer with X-ray (radiation) or medicines (chemotherapy).  You have given birth or are breastfeeding.  You are older than age 50.  You smoke. What are the signs or symptoms? Symptoms of this condition include:  Pain, soreness, or bleeding during sexual intercourse (dyspareunia).  Vaginal burning, irritation, or itching.  Pain or bleeding when a speculum is used in a vaginal exam (pelvic exam).  Having burning pain when passing urine.  Vaginal discharge that is brown or yellow. In some cases, there are no symptoms. How is this diagnosed? This condition is diagnosed by taking a medical history and doing a physical exam. This will include a pelvic exam that checks the vaginal tissues. Though rare, you may also have other tests, including:  A urine test.  A test that checks the acid balance in your vagina (acid balance test). How is this treated? Treatment for this condition depends on how severe your symptoms are. Treatment may include:  Using an over-the-counter vaginal lubricant before sex.  Using a long-acting vaginal  moisturizer.  Using low-dose vaginal estrogen for moderate to severe symptoms that do not respond to other treatments. Options include creams, tablets, and inserts (vaginal rings). Before you use a vaginal estrogen, tell your health care provider if you have a history of: ? Breast cancer. ? Endometrial cancer. ? Blood clots. If you are not sexually active and your symptoms are very mild, you may not need treatment. Follow these instructions at home: Medicines  Take over-the-counter and prescription medicines only as told by your health care provider. Do not use herbal or alternative medicines unless your health care provider says that you can.  Use over-the-counter creams, lubricants, or moisturizers for dryness only as directed by your health care provider. General instructions  If your atrophic vaginitis is caused by menopause, discuss all of your menopause symptoms and treatment options with your health care provider.  Do not douche.  Do not use products that can make your vagina dry. These include: ? Scented feminine sprays. ? Scented tampons. ?   Scented soaps.  Vaginal intercourse can help to improve blood flow and elasticity of vaginal tissue. If it hurts to have sex, try using a lubricant or moisturizer just before having intercourse. Contact a health care provider if:  Your discharge looks different than normal.  Your vagina has an unusual smell.  You have new symptoms.  Your symptoms do not improve with treatment.  Your symptoms get worse. Summary  Atrophic vaginitis is a condition in which the tissues that line the vagina become dry and thin. It is most common in women who have stopped having regular menstrual periods (are in menopause).  Treatment options include using vaginal lubricants and low-dose vaginal estrogen.  Contact a health care provider if your vagina has an unusual smell, or if your symptoms get worse or do not improve after treatment. This  information is not intended to replace advice given to you by your health care provider. Make sure you discuss any questions you have with your health care provider. Document Revised: 12/29/2016 Document Reviewed: 10/12/2016 Elsevier Patient Education  2020 Elsevier Inc.  

## 2019-04-19 ENCOUNTER — Other Ambulatory Visit: Payer: Self-pay | Admitting: Certified Nurse Midwife

## 2019-04-19 DIAGNOSIS — R899 Unspecified abnormal finding in specimens from other organs, systems and tissues: Secondary | ICD-10-CM

## 2019-04-19 LAB — COMPREHENSIVE METABOLIC PANEL
ALT: 24 IU/L (ref 0–32)
AST: 25 IU/L (ref 0–40)
Albumin/Globulin Ratio: 2.5 — ABNORMAL HIGH (ref 1.2–2.2)
Albumin: 4.7 g/dL (ref 3.8–4.9)
Alkaline Phosphatase: 53 IU/L (ref 39–117)
BUN/Creatinine Ratio: 13 (ref 9–23)
BUN: 12 mg/dL (ref 6–24)
Bilirubin Total: 0.5 mg/dL (ref 0.0–1.2)
CO2: 26 mmol/L (ref 20–29)
Calcium: 9.6 mg/dL (ref 8.7–10.2)
Chloride: 102 mmol/L (ref 96–106)
Creatinine, Ser: 0.89 mg/dL (ref 0.57–1.00)
GFR calc Af Amer: 86 mL/min/{1.73_m2} (ref >59)
GFR calc non Af Amer: 74 mL/min/{1.73_m2} (ref >59)
Globulin, Total: 1.9 g/dL (ref 1.5–4.5)
Glucose: 77 mg/dL (ref 65–99)
Potassium: 4.3 mmol/L (ref 3.5–5.2)
Sodium: 141 mmol/L (ref 134–144)
Total Protein: 6.6 g/dL (ref 6.0–8.5)

## 2019-04-19 LAB — CBC
Hematocrit: 41.3 % (ref 34.0–46.6)
Hemoglobin: 14.3 g/dL (ref 11.1–15.9)
MCH: 29.7 pg (ref 26.6–33.0)
MCHC: 34.6 g/dL (ref 31.5–35.7)
MCV: 86 fL (ref 79–97)
Platelets: 183 10*3/uL (ref 150–450)
RBC: 4.82 x10E6/uL (ref 3.77–5.28)
RDW: 12.8 % (ref 11.7–15.4)
WBC: 2.8 10*3/uL — ABNORMAL LOW (ref 3.4–10.8)

## 2019-04-19 LAB — TSH: TSH: 0.685 u[IU]/mL (ref 0.450–4.500)

## 2019-04-19 LAB — LIPID PANEL
Chol/HDL Ratio: 2.1 ratio (ref 0.0–4.4)
Cholesterol, Total: 186 mg/dL (ref 100–199)
HDL: 89 mg/dL (ref >39)
LDL Chol Calc (NIH): 88 mg/dL (ref 0–99)
Triglycerides: 47 mg/dL (ref 0–149)
VLDL Cholesterol Cal: 9 mg/dL (ref 5–40)

## 2019-04-19 LAB — VITAMIN D 25 HYDROXY (VIT D DEFICIENCY, FRACTURES): Vit D, 25-Hydroxy: 42.1 ng/mL (ref 30.0–100.0)

## 2019-04-21 ENCOUNTER — Other Ambulatory Visit: Payer: Self-pay

## 2019-04-21 ENCOUNTER — Other Ambulatory Visit (INDEPENDENT_AMBULATORY_CARE_PROVIDER_SITE_OTHER): Payer: No Typology Code available for payment source

## 2019-04-21 ENCOUNTER — Encounter: Payer: Self-pay | Admitting: Certified Nurse Midwife

## 2019-04-21 DIAGNOSIS — R899 Unspecified abnormal finding in specimens from other organs, systems and tissues: Secondary | ICD-10-CM

## 2019-04-21 NOTE — Progress Notes (Signed)
Mammogram reviewed negative Birads 1 density b

## 2019-04-22 ENCOUNTER — Other Ambulatory Visit: Payer: Self-pay | Admitting: Certified Nurse Midwife

## 2019-04-22 ENCOUNTER — Telehealth: Payer: Self-pay | Admitting: Certified Nurse Midwife

## 2019-04-22 DIAGNOSIS — R6889 Other general symptoms and signs: Secondary | ICD-10-CM

## 2019-04-22 LAB — CBC WITH DIFFERENTIAL/PLATELET
Basophils Absolute: 0 10*3/uL (ref 0.0–0.2)
Basos: 1 %
EOS (ABSOLUTE): 0.1 10*3/uL (ref 0.0–0.4)
Eos: 2 %
Hematocrit: 40.7 % (ref 34.0–46.6)
Hemoglobin: 13.3 g/dL (ref 11.1–15.9)
Immature Grans (Abs): 0 10*3/uL (ref 0.0–0.1)
Immature Granulocytes: 0 %
Lymphocytes Absolute: 0.7 10*3/uL (ref 0.7–3.1)
Lymphs: 25 %
MCH: 28.8 pg (ref 26.6–33.0)
MCHC: 32.7 g/dL (ref 31.5–35.7)
MCV: 88 fL (ref 79–97)
Monocytes Absolute: 0.3 10*3/uL (ref 0.1–0.9)
Monocytes: 8 %
Neutrophils Absolute: 1.9 10*3/uL (ref 1.4–7.0)
Neutrophils: 64 %
Platelets: 177 10*3/uL (ref 150–450)
RBC: 4.62 x10E6/uL (ref 3.77–5.28)
RDW: 13 % (ref 11.7–15.4)
WBC: 3 10*3/uL — ABNORMAL LOW (ref 3.4–10.8)

## 2019-04-22 NOTE — Telephone Encounter (Signed)
Patient is returning call to [place]. No telephone encounter open.

## 2019-04-22 NOTE — Telephone Encounter (Signed)
Left detailed message for pt with results. Pt to call back to office starting on 04/23/2019 for recheck appt and any concerns or questions.

## 2019-04-23 NOTE — Telephone Encounter (Signed)
Spoke back with pt. Pt scheduled 1 week recheck on CBC due to low WBC per Johny Shock, CNM result note on 04/22/19.  Future Orders placed.   Encounter closed.

## 2019-05-01 ENCOUNTER — Other Ambulatory Visit: Payer: Self-pay

## 2019-05-05 ENCOUNTER — Other Ambulatory Visit: Payer: Self-pay

## 2019-05-05 ENCOUNTER — Other Ambulatory Visit (INDEPENDENT_AMBULATORY_CARE_PROVIDER_SITE_OTHER): Payer: No Typology Code available for payment source

## 2019-05-05 DIAGNOSIS — R6889 Other general symptoms and signs: Secondary | ICD-10-CM

## 2019-05-06 LAB — CBC WITH DIFFERENTIAL/PLATELET
Basophils Absolute: 0 10*3/uL (ref 0.0–0.2)
Basos: 1 %
EOS (ABSOLUTE): 0.1 10*3/uL (ref 0.0–0.4)
Eos: 2 %
Hematocrit: 41.6 % (ref 34.0–46.6)
Hemoglobin: 13.8 g/dL (ref 11.1–15.9)
Immature Grans (Abs): 0 10*3/uL (ref 0.0–0.1)
Immature Granulocytes: 0 %
Lymphocytes Absolute: 0.8 10*3/uL (ref 0.7–3.1)
Lymphs: 24 %
MCH: 29.8 pg (ref 26.6–33.0)
MCHC: 33.2 g/dL (ref 31.5–35.7)
MCV: 90 fL (ref 79–97)
Monocytes Absolute: 0.3 10*3/uL (ref 0.1–0.9)
Monocytes: 8 %
Neutrophils Absolute: 2.1 10*3/uL (ref 1.4–7.0)
Neutrophils: 65 %
Platelets: 168 10*3/uL (ref 150–450)
RBC: 4.63 x10E6/uL (ref 3.77–5.28)
RDW: 13 % (ref 11.7–15.4)
WBC: 3.2 10*3/uL — ABNORMAL LOW (ref 3.4–10.8)

## 2019-05-09 ENCOUNTER — Other Ambulatory Visit: Payer: Self-pay | Admitting: Obstetrics and Gynecology

## 2019-05-09 DIAGNOSIS — D72819 Decreased white blood cell count, unspecified: Secondary | ICD-10-CM

## 2019-06-08 ENCOUNTER — Telehealth: Payer: Self-pay | Admitting: Obstetrics and Gynecology

## 2019-06-08 NOTE — Telephone Encounter (Signed)
Please contact patient regarding follow up of labs ordered by French Ana.   Patient has a low WBC being monitored.  It is improved but still not back up to normal.   She came up in my reminder box in Epic.

## 2019-06-12 NOTE — Telephone Encounter (Signed)
Called patient and left message for her to call Estill Bamberg, CMA.

## 2019-06-12 NOTE — Telephone Encounter (Signed)
Patient returned call. Ok to leave a detailed voice message. Patient scheduled fasting lab appointment for Wed 5/19 at 8:45 am.

## 2019-06-18 ENCOUNTER — Other Ambulatory Visit (INDEPENDENT_AMBULATORY_CARE_PROVIDER_SITE_OTHER): Payer: No Typology Code available for payment source

## 2019-06-18 ENCOUNTER — Other Ambulatory Visit: Payer: Self-pay

## 2019-06-18 DIAGNOSIS — D72819 Decreased white blood cell count, unspecified: Secondary | ICD-10-CM

## 2019-06-19 ENCOUNTER — Other Ambulatory Visit: Payer: Self-pay | Admitting: *Deleted

## 2019-06-19 ENCOUNTER — Encounter: Payer: Self-pay | Admitting: Obstetrics and Gynecology

## 2019-06-19 DIAGNOSIS — D72819 Decreased white blood cell count, unspecified: Secondary | ICD-10-CM

## 2019-06-19 LAB — CBC WITH DIFFERENTIAL/PLATELET
Basophils Absolute: 0 10*3/uL (ref 0.0–0.2)
Basos: 1 %
EOS (ABSOLUTE): 0.1 10*3/uL (ref 0.0–0.4)
Eos: 4 %
Hematocrit: 44.7 % (ref 34.0–46.6)
Hemoglobin: 14.5 g/dL (ref 11.1–15.9)
Immature Grans (Abs): 0 10*3/uL (ref 0.0–0.1)
Immature Granulocytes: 1 %
Lymphocytes Absolute: 0.7 10*3/uL (ref 0.7–3.1)
Lymphs: 25 %
MCH: 29.4 pg (ref 26.6–33.0)
MCHC: 32.4 g/dL (ref 31.5–35.7)
MCV: 91 fL (ref 79–97)
Monocytes Absolute: 0.2 10*3/uL (ref 0.1–0.9)
Monocytes: 9 %
Neutrophils Absolute: 1.7 10*3/uL (ref 1.4–7.0)
Neutrophils: 60 %
Platelets: 172 10*3/uL (ref 150–450)
RBC: 4.93 x10E6/uL (ref 3.77–5.28)
RDW: 13.2 % (ref 11.7–15.4)
WBC: 2.8 10*3/uL — ABNORMAL LOW (ref 3.4–10.8)

## 2019-07-02 ENCOUNTER — Other Ambulatory Visit: Payer: Self-pay | Admitting: Family

## 2019-07-02 DIAGNOSIS — D72818 Other decreased white blood cell count: Secondary | ICD-10-CM

## 2019-07-03 ENCOUNTER — Encounter: Payer: Self-pay | Admitting: Family

## 2019-07-03 ENCOUNTER — Inpatient Hospital Stay (HOSPITAL_BASED_OUTPATIENT_CLINIC_OR_DEPARTMENT_OTHER): Payer: No Typology Code available for payment source | Admitting: Family

## 2019-07-03 ENCOUNTER — Other Ambulatory Visit: Payer: Self-pay

## 2019-07-03 ENCOUNTER — Inpatient Hospital Stay: Payer: No Typology Code available for payment source | Attending: Family

## 2019-07-03 VITALS — BP 111/66 | HR 66 | Temp 96.8°F | Resp 18 | Ht 67.0 in | Wt 133.0 lb

## 2019-07-03 DIAGNOSIS — D72818 Other decreased white blood cell count: Secondary | ICD-10-CM

## 2019-07-03 DIAGNOSIS — Z79899 Other long term (current) drug therapy: Secondary | ICD-10-CM | POA: Insufficient documentation

## 2019-07-03 DIAGNOSIS — R232 Flushing: Secondary | ICD-10-CM | POA: Insufficient documentation

## 2019-07-03 DIAGNOSIS — D72819 Decreased white blood cell count, unspecified: Secondary | ICD-10-CM

## 2019-07-03 LAB — CMP (CANCER CENTER ONLY)
ALT: 17 U/L (ref 0–44)
AST: 19 U/L (ref 15–41)
Albumin: 4.6 g/dL (ref 3.5–5.0)
Alkaline Phosphatase: 49 U/L (ref 38–126)
Anion gap: 6 (ref 5–15)
BUN: 17 mg/dL (ref 6–20)
CO2: 31 mmol/L (ref 22–32)
Calcium: 9.9 mg/dL (ref 8.9–10.3)
Chloride: 103 mmol/L (ref 98–111)
Creatinine: 0.85 mg/dL (ref 0.44–1.00)
GFR, Est AFR Am: >60 mL/min (ref >60)
GFR, Estimated: >60 mL/min (ref >60)
Glucose, Bld: 111 mg/dL — ABNORMAL HIGH (ref 70–99)
Potassium: 4 mmol/L (ref 3.5–5.1)
Sodium: 140 mmol/L (ref 135–145)
Total Bilirubin: 0.5 mg/dL (ref 0.3–1.2)
Total Protein: 6.9 g/dL (ref 6.5–8.1)

## 2019-07-03 LAB — CBC WITH DIFFERENTIAL (CANCER CENTER ONLY)
Abs Immature Granulocytes: 0.02 10*3/uL (ref 0.00–0.07)
Basophils Absolute: 0 10*3/uL (ref 0.0–0.1)
Basophils Relative: 1 %
Eosinophils Absolute: 0.1 10*3/uL (ref 0.0–0.5)
Eosinophils Relative: 1 %
HCT: 41.7 % (ref 36.0–46.0)
Hemoglobin: 13.7 g/dL (ref 12.0–15.0)
Immature Granulocytes: 0 %
Lymphocytes Relative: 16 %
Lymphs Abs: 0.9 10*3/uL (ref 0.7–4.0)
MCH: 29 pg (ref 26.0–34.0)
MCHC: 32.9 g/dL (ref 30.0–36.0)
MCV: 88.3 fL (ref 80.0–100.0)
Monocytes Absolute: 0.3 10*3/uL (ref 0.1–1.0)
Monocytes Relative: 5 %
Neutro Abs: 4 10*3/uL (ref 1.7–7.7)
Neutrophils Relative %: 77 %
Platelet Count: 160 10*3/uL (ref 150–400)
RBC: 4.72 MIL/uL (ref 3.87–5.11)
RDW: 12.8 % (ref 11.5–15.5)
WBC Count: 5.3 10*3/uL (ref 4.0–10.5)
nRBC: 0 % (ref 0.0–0.2)

## 2019-07-03 LAB — SAVE SMEAR(SSMR), FOR PROVIDER SLIDE REVIEW

## 2019-07-03 NOTE — Progress Notes (Signed)
Hematology/Oncology Consultation   Name: LUCAS AURE      MRN: [id number]    Location: Room/bed info not found  Date: 07/03/2019 Time:3:22 PM   REFERRING PHYSICIAN: Brooke A. Quincy Simmonds, MD  REASON FOR CONSULT: Leukopenia    DIAGNOSIS: Mild leukopenia   HISTORY OF PRESENT ILLNESS: Ms. Tejero is a very pleasant 54 yo caucasian female with a 3 year history of mild intermittent leukopenia. Her WBC count for the last few month has been 2.8-3.2. She states that this happened once before in 2018 with a WBC count is 2.8 but she has remained asymptomatic each time.  Her WBC count today is back to normal at 5.3. Her differential is unremarkable.  She has had no issues with infection. No fever, chills, n/v, cough, rash, dizziness, SOB, chest pain, abdominal pain or changes in bowel or bladder habits.  She states that she has occasional palpitations with anxiety, cardiology work up was negative.  She has history of lung nodules and has been followed by pulmonologist Dr. Bernita Raisin. Biodesix nodify testing was negative (low risk < 1%).  She is postmenopausal, female organs still in place. She uses estradiol.  She has the occasional hot flash.  She is quite active and does work out regularly.  No personal or familial history of blood disorders or cancer.  No diabetes or thyroid disease.  No swelling, tenderness, numbness or tingling in her extremities.  No falls or syncopal episodes.  She has neck pain that comes and goes which she feels is due to over use.  She works full time as a Community education officer.  She does not smoke or use recreational drugs. She has the occasional alcoholic beverage socially.   ROS: All other 10 point review of systems is negative.   PAST MEDICAL HISTORY:   Past Medical History:  Diagnosis Date  . Leukopenia   . Migraines   . PVC (premature ventricular contraction) 04/01/2015  . Renal calculi 4/09   followed yearly follow up- Dr. Jeffie Pollock  . Screening for HIV (human  immunodeficiency virus)     ALLERGIES: No Known Allergies    MEDICATIONS:  Current Outpatient Medications on File Prior to Visit  Medication Sig Dispense Refill  . Calcium Carbonate-Vitamin D (CALCIUM 600+D) 600-400 MG-UNIT tablet Take 1 tablet by mouth.     . Cholecalciferol (VITAMIN D-3 PO) Take 2,000 Units by mouth 2 (two) times daily.     . Estradiol 10 MCG TABS vaginal tablet Insert one tablet vaginally twice weekly for vaginal dryness 24 tablet 3  . Multiple Vitamins-Minerals (MULTIVITAMIN PO) Take by mouth.    . Omega-3 Fatty Acids (FISH OIL PO) Take by mouth.     No current facility-administered medications on file prior to visit.     PAST SURGICAL HISTORY Past Surgical History:  Procedure Laterality Date  . BACK SURGERY  1/16   herniated disc  . CESAREAN SECTION     x3  . CHOLECYSTECTOMY  2001  . EXTRACORPOREAL SHOCK WAVE LITHOTRIPSY Right 04/19/2017   Procedure: RIGHT EXTRACORPOREAL SHOCK WAVE LITHOTRIPSY (ESWL);  Surgeon: Ceasar Mons, MD;  Location: [facility];  Service: Urology;  Laterality: Right;  . LAPAROSCOPIC CHOLECYSTECTOMY  2001  . REFRACTIVE SURGERY Bilateral 2006   lasik  . SPINE SURGERY      FAMILY HISTORY: Family History  Problem Relation Age of Onset  . Hyperlipidemia Mother   . Alcohol abuse Mother   . Arthritis Mother   . Alzheimer's disease Mother   . Hypertension  Father   . Parkinson's disease Father   . Alcohol abuse Father   . Kidney Stones Brother   . Alcohol abuse Brother   . Migraines Son     SOCIAL HISTORY:  reports that she has never smoked. She has never used smokeless tobacco. She reports current alcohol use of about 3.0 standard drinks of alcohol per week. She reports that she does not use drugs.  PERFORMANCE STATUS: The patient's performance status is 0 - Asymptomatic  PHYSICAL EXAM: Most Recent Vital Signs: Last menstrual period 01/12/2014. LMP 01/12/2014 (LMP Unknown)   General Appearance:    Alert,  cooperative, no distress, appears stated age  Head:    Normocephalic, without obvious abnormality, atraumatic  Eyes:    PERRL, conjunctiva/corneas clear, EOM's intact, fundi    benign, both eyes        Throat:   Lips, mucosa, and tongue normal; teeth and gums normal  Neck:   Supple, symmetrical, trachea midline, no adenopathy;    thyroid:  no enlargement/tenderness/nodules; no carotid   bruit or JVD  Back:     Symmetric, no curvature, ROM normal, no CVA tenderness  Lungs:     Clear to auscultation bilaterally, respirations unlabored  Chest Wall:    No tenderness or deformity   Heart:    Regular rate and rhythm, S1 and S2 normal, no murmur, rub   or gallop     Abdomen:     Soft, non-tender, bowel sounds active all four quadrants,    no masses, no organomegaly        Extremities:   Extremities normal, atraumatic, no cyanosis or edema  Pulses:   2+ and symmetric all extremities  Skin:   Skin color, texture, turgor normal, no rashes or lesions  Lymph nodes:   Cervical, supraclavicular, and axillary nodes normal  Neurologic:   CNII-XII intact, normal strength, sensation and reflexes    throughout    LABORATORY DATA:  Results for orders placed or performed in visit on 07/03/19 (from the past 48 hour(s))  CBC with Differential (Cancer Center Only)     Status: None   Collection Time: 07/03/19  3:07 PM  Result Value Ref Range   WBC Count 5.3 4.0 - 10.5 K/uL   RBC 4.72 3.87 - 5.11 MIL/uL   Hemoglobin 13.7 12.0 - 15.0 g/dL   HCT 41.7 36.0 - 46.0 %   MCV 88.3 80.0 - 100.0 fL   MCH 29.0 26.0 - 34.0 pg   MCHC 32.9 30.0 - 36.0 g/dL   RDW 12.8 11.5 - 15.5 %   Platelet Count 160 150 - 400 K/uL   nRBC 0.0 0.0 - 0.2 %   Neutrophils Relative % 77 %   Neutro Abs 4.0 1.7 - 7.7 K/uL   Lymphocytes Relative 16 %   Lymphs Abs 0.9 0.7 - 4.0 K/uL   Monocytes Relative 5 %   Monocytes Absolute 0.3 0.1 - 1.0 K/uL   Eosinophils Relative 1 %   Eosinophils Absolute 0.1 0.0 - 0.5 K/uL   Basophils  Relative 1 %   Basophils Absolute 0.0 0.0 - 0.1 K/uL   Immature Granulocytes 0 %   Abs Immature Granulocytes 0.02 0.00 - 0.07 K/uL    Comment: Performed at [facility] at [facility], [address], [place], [place] [address]  [facility])     Status: None   Collection Time: 07/03/19  3:07 PM  Result Value Ref Range   Smear Review  SMEAR STAINED AND AVAILABLE FOR REVIEW     Comment: Performed at [facility] at [facility], [address] [place], [place] [address]      RADIOGRAPHY: No results found.     PATHOLOGY: None   ASSESSMENT/PLAN: Ms. Durman is a very pleasant 89 yo caucasian female with a 3 year history of mild intermittent leukopenia.  Her WBC count today is back to normal, differential unremarkable. She remains asymptomatic and has no complaints at this time.   All questions were answered and she is in agreement. She can contact our office with any future questions or concerns. We can certainly see her again if needed.   She was discussed with and also seen by Dr. Marin Olp and he is in agreement with the aforementioned.   Laverna Peace, NP   ADDENDUM: I saw and examined Ms. Alleva with Judson Roch.  I agree with the above assessment.  I looked at her blood smear under the microscope.  I do not see anything that looked suspicious.  She had normal maturity of her white blood cells.  There were no immature myeloid or lymphoid cells.  Red cells are without nucleated red blood cells.  There were no teardrop cells.  There were no schistocytes.  She had no rouleaux formation.  Platelets were adequate number and size.  She has a normal white blood cell differential.  Her exam is unremarkable.  There is no adenopathy.  There is no splenomegaly.  I am not sure as to why she had the leukopenia.  This certainly was transient.  I do not see any medications that would be causing an issue.  At this point, I think we can let  her go from the clinic.  I just do not see that there is a hematologic issue for [place] to deal with.  There is no indication for a bone marrow test.  There is no indication for scans.  We spent about 45 minutes with Ms. Rockey.  She is very nice.  She was fun to talk to.  Lattie Haw, MD

## 2019-07-04 ENCOUNTER — Telehealth: Payer: Self-pay | Admitting: Hematology & Oncology

## 2019-07-04 LAB — LACTATE DEHYDROGENASE: LDH: 187 U/L (ref 98–192)

## 2019-07-04 NOTE — Telephone Encounter (Signed)
No los 6/3 °

## 2019-07-22 ENCOUNTER — Other Ambulatory Visit: Payer: No Typology Code available for payment source

## 2019-07-22 ENCOUNTER — Inpatient Hospital Stay: Payer: No Typology Code available for payment source | Admitting: Family

## 2019-10-13 ENCOUNTER — Ambulatory Visit: Payer: No Typology Code available for payment source | Admitting: Clinical

## 2019-10-30 ENCOUNTER — Telehealth: Payer: Self-pay | Admitting: Pulmonary Disease

## 2019-10-30 DIAGNOSIS — R911 Solitary pulmonary nodule: Secondary | ICD-10-CM

## 2019-10-30 DIAGNOSIS — R918 Other nonspecific abnormal finding of lung field: Secondary | ICD-10-CM

## 2019-10-30 NOTE — Telephone Encounter (Signed)
Spoke with patient regarding prior message.My last check up was in Oct 2019 (CT Scan). I then the Biodesix blood work done. This came back negative but I was wondering if I should still be getting CT scans to monitor the nodules in my lung Advised patient I was going to send this message to Dr.Mannam.  Dr.Mannam can you pleased advise.  Thank you

## 2019-10-30 NOTE — Telephone Encounter (Signed)
Yes. Please order CT chest without contrast for lung noduleas next available and make clinic appoint,ent.   The plan was to keep following them

## 2019-10-31 NOTE — Telephone Encounter (Signed)
Spoke with the pt and notified of response per Dr Vaughan Browner  She verbalized understanding and order for ct sent to [facility] with note to schedule appt with Dr Vaughan Browner afterwards Nothing further needed

## 2019-11-06 ENCOUNTER — Ambulatory Visit (HOSPITAL_BASED_OUTPATIENT_CLINIC_OR_DEPARTMENT_OTHER): Payer: No Typology Code available for payment source

## 2019-11-11 ENCOUNTER — Other Ambulatory Visit: Payer: Self-pay

## 2019-11-11 ENCOUNTER — Ambulatory Visit (INDEPENDENT_AMBULATORY_CARE_PROVIDER_SITE_OTHER): Payer: No Typology Code available for payment source | Admitting: Pulmonary Disease

## 2019-11-11 ENCOUNTER — Encounter: Payer: Self-pay | Admitting: Pulmonary Disease

## 2019-11-11 DIAGNOSIS — R918 Other nonspecific abnormal finding of lung field: Secondary | ICD-10-CM

## 2019-11-11 NOTE — Patient Instructions (Addendum)
CT chest approved after peer to peer.  Wyn Quaker, FNP

## 2019-11-11 NOTE — Progress Notes (Signed)
11/11/19   Peer to Peer  CT chest without contrast  Chart review: Patient was last seen formally in our office in October/2019.  She is followed by Dr. Vaughan Browner.  The data reviewed at that time is listed below:  CT abd 05/15/08- B/L basal lung nodules CT scan chest 09/11/08- Multiple B/L pulmonary nodule ranging from 5-13 PET scan 09/30/08- Pulmonary nodules with mildly elevated activity. CT scan chest 04/01/09- Persistent pulmonary nodules. PET scan 04/09/09- Negative as per patient. No report available to review CT scan chest 10/14/09- Stable pulmonary nodules CT scan chest 09/22/10- Stable pulmonary nodules CT scan chest 03/18/15- Pulmonary nodules some larger and some smaller compared to 2010. Largest is 10cm CT scan chest 10/01/15- Multiple pulmonary nodules. Some with small enlargement. Largest is 11 mm.  PET Scan 10/20/15- Low level uptake. CT scan 11/21/2017- right lower lobe nodule has slightly enlarged.  Additional bilateral pulmonary nodules within indolent growth.  Patient proceeded forward with B[facility] testing those results are listed below:  [facility] results Nodify XL2 11/28/17 Results reviewed.  Posttest probability of malignancy less than 1%. Will be scanned into computer  Let the patient know that the blood test shows that the lung nodules  very low risk of malignancy.  Have reviewed these results with the insurance provider.  Explained that patient's CT has been ordered to follow the nodules that were seen October/2019.  This test has been approved.  Approval number is listed below: [id number] Exp 12/26/19  Forms handed back to [facility] pool. Will route to Dr. Vaughan Browner as fyi.  Wyn Quaker FNP

## 2019-11-13 ENCOUNTER — Other Ambulatory Visit: Payer: Self-pay

## 2019-11-13 ENCOUNTER — Ambulatory Visit (HOSPITAL_BASED_OUTPATIENT_CLINIC_OR_DEPARTMENT_OTHER)
Admission: RE | Admit: 2019-11-13 | Discharge: 2019-11-13 | Disposition: A | Discharge status: Home / Self Care | Payer: No Typology Code available for payment source | Source: Ambulatory Visit | Attending: Pulmonary Disease | Admitting: Pulmonary Disease

## 2019-11-13 DIAGNOSIS — R911 Solitary pulmonary nodule: Secondary | ICD-10-CM | POA: Insufficient documentation

## 2019-11-13 DIAGNOSIS — R918 Other nonspecific abnormal finding of lung field: Secondary | ICD-10-CM | POA: Diagnosis present

## 2019-11-15 ENCOUNTER — Telehealth: Payer: Self-pay | Admitting: Pulmonary Disease

## 2019-11-15 DIAGNOSIS — R911 Solitary pulmonary nodule: Secondary | ICD-10-CM

## 2019-11-15 DIAGNOSIS — R918 Other nonspecific abnormal finding of lung field: Secondary | ICD-10-CM

## 2019-11-15 NOTE — Telephone Encounter (Signed)
I called and discussed CT results (11/13/19) showing more prominence of the right lower lobe nodule in addition to other pulmonary nodules.  Given slow growth over time the suspicion is for adenocarcinoma and she will need navigational biopsy.  I will discuss with my partners Dr. Lamonte Sakai and Dr. Valeta Harms if he can get this arranged Lisa-please get a super D disc made of her CT scan.  Marshell Garfinkel MD [facility] 11/15/2019, 1:46 PM

## 2019-11-17 ENCOUNTER — Other Ambulatory Visit (HOSPITAL_BASED_OUTPATIENT_CLINIC_OR_DEPARTMENT_OTHER): Payer: Self-pay | Admitting: Internal Medicine

## 2019-11-17 ENCOUNTER — Ambulatory Visit: Payer: No Typology Code available for payment source | Attending: Internal Medicine

## 2019-11-17 DIAGNOSIS — Z23 Encounter for immunization: Secondary | ICD-10-CM

## 2019-11-17 NOTE — Telephone Encounter (Signed)
Called [facility] imaging to request super D. Left detailed message on VM, with call back number. Will follow up.

## 2019-11-17 NOTE — Progress Notes (Signed)
   Covid-19 Vaccination Clinic  Name:  Laura Ritter    MRN: [id number] DOB: [birth year]  11/17/2019  [name] was observed post Covid-19 immunization for 15 minutes without incident. She was provided with Vaccine Information Sheet and instruction to access the V-Safe system.   [name] was instructed to call 911 with any severe reactions post vaccine: Marland Kitchen Difficulty breathing  . Swelling of face and throat  . A fast heartbeat  . A bad rash all over body  . Dizziness and weakness

## 2019-11-17 NOTE — Telephone Encounter (Signed)
I could probably do on 11/4. Will work on scheduling if you / she want to move forward.

## 2019-11-17 NOTE — Telephone Encounter (Signed)
Called received from [place], [place].  Super D is being sent by courier.

## 2019-11-18 NOTE — Telephone Encounter (Signed)
Thanks PM,   I am available any afternoon next week as I will be in the hospital and I am doing cases Nov 2nd in endo.   Looks like Rob has already reviewed the images and if Nov 4th doesn't work out let me know and I can help however.  Thanks [place], DO [facility] 11/18/2019 8:24 AM

## 2019-11-19 NOTE — Telephone Encounter (Signed)
I called and left a message for patient requesting call back to discuss scheduling procedure with Dr. Lamonte Sakai on 11/5

## 2019-11-19 NOTE — Telephone Encounter (Signed)
Patient is returning Dr. Vaughan Browner phone call for Bronch procedure. Patient phone number is [phone]. Has questions about procdure. Not available between 2:00 pm - 3:30 pm.

## 2019-11-19 NOTE — Telephone Encounter (Signed)
I called and spoke with patient regarding the procedure scheduling. 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  Leroy Sea can you fit her in for 29th? Otherwise we can do 2nd or 4th afternoon as endo schedule permits. I placed the order for bronch scheduling  Thanks to all for helping

## 2019-11-20 ENCOUNTER — Telehealth: Payer: Self-pay | Admitting: Pulmonary Disease

## 2019-11-20 NOTE — Telephone Encounter (Signed)
PCCM:   I can schedule for Friday AM 29th Orders placed for bronchoscopy   Thanks  Garner Nash, DO [facility] 11/20/2019 9:17 AM

## 2019-11-20 NOTE — Telephone Encounter (Signed)
ENB - 10/29 @ 7:30, check in by 5:00 AM COVID Test:  10/27 @ 2:30 SUPER D CT disk ordered by Kirk Ruths per 10/16 telephone note.    Pt aware of all appointments.

## 2019-11-20 NOTE — Telephone Encounter (Signed)
noted 

## 2019-11-25 ENCOUNTER — Inpatient Hospital Stay (HOSPITAL_COMMUNITY): Admission: RE | Admit: 2019-11-25 | Payer: No Typology Code available for payment source | Source: Ambulatory Visit

## 2019-11-25 MED FILL — PFIZER-BIONTECH COVID-19 VA: 30 | 1 days supply | Qty: 0 | Fill #0

## 2019-11-26 ENCOUNTER — Telehealth: Payer: Self-pay | Admitting: Pulmonary Disease

## 2019-11-26 ENCOUNTER — Encounter (HOSPITAL_COMMUNITY): Payer: Self-pay | Admitting: Pulmonary Disease

## 2019-11-26 NOTE — Telephone Encounter (Signed)
No auth req for this procedure cpt code [id number] left message on phone for Laura Ritter

## 2019-11-26 NOTE — Progress Notes (Signed)
Patient denies shortness of breath, fever, cough or chest pain.  PCP - Howard Pouch, DO Cardiologist - n/a Pulmonology - Dr Marshell Garfinkel  Chest x-ray - n/a, CT Chest 11/13/19 EKG - n/a Stress Test - n/a ECHO - 03/10/15 Cardiac Cath - n/a  STOP now taking any Aspirin (unless otherwise instructed by your surgeon), Aleve, Naproxen, Ibuprofen, Motrin, Advil, Goody's, BC's, all herbal medications, fish oil, and all vitamins.   Coronavirus Screening Covid test scheduled on 11/27/19. Do you have any of the following symptoms:  Cough yes/no: No Fever (>100.8F)  yes/no: No Runny nose yes/no: No Sore throat yes/no: No Difficulty breathing/shortness of breath  yes/no: No  Have you traveled in the last 14 days and where? yes/no: No  Patient verbalized understanding of instructions that were given via phone.

## 2019-11-27 ENCOUNTER — Other Ambulatory Visit (HOSPITAL_COMMUNITY)
Admission: RE | Admit: 2019-11-27 | Discharge: 2019-11-27 | Disposition: A | Discharge status: Home / Self Care | Payer: No Typology Code available for payment source | Source: Ambulatory Visit | Attending: Pulmonary Disease | Admitting: Pulmonary Disease

## 2019-11-27 DIAGNOSIS — Z20822 Contact with and (suspected) exposure to covid-19: Secondary | ICD-10-CM | POA: Insufficient documentation

## 2019-11-27 DIAGNOSIS — Z01812 Encounter for preprocedural laboratory examination: Secondary | ICD-10-CM | POA: Insufficient documentation

## 2019-11-27 LAB — SARS CORONAVIRUS 2 (TAT 6-24 HRS): SARS Coronavirus 2: NEGATIVE

## 2019-11-27 NOTE — Anesthesia Preprocedure Evaluation (Addendum)
Anesthesia Evaluation  Patient identified by MRN, date of birth, ID band  Reviewed: Allergy & Precautions, NPO status , Patient's Chart, lab work & pertinent test results  Airway Mallampati: I  TM Distance: >3 FB Neck ROM: Full    Dental no notable dental hx. (+) Dental Advisory Given, Teeth Intact   Pulmonary neg pulmonary ROS,    Pulmonary exam normal breath sounds clear to auscultation       Cardiovascular negative cardio ROS Normal cardiovascular exam Rhythm:Regular Rate:Normal     Neuro/Psych negative neurological ROS     GI/Hepatic negative GI ROS, Neg liver ROS,   Endo/Other  negative endocrine ROS  Renal/GU negative Renal ROS     Musculoskeletal  (+) Arthritis ,   Abdominal   Peds  Hematology negative hematology ROS (+)   Anesthesia Other Findings   Reproductive/Obstetrics                            Anesthesia Physical Anesthesia Plan  ASA: II  Anesthesia Plan: General   Post-op Pain Management:    Induction: Intravenous  PONV Risk Score and Plan: Treatment may vary due to age or medical condition, Ondansetron, Dexamethasone, Midazolam and Scopolamine patch - Pre-op  Airway Management Planned: Oral ETT  Additional Equipment: None  Intra-op Plan:   Post-operative Plan: Extubation in OR  Informed Consent: I have reviewed the patients History and Physical, chart, labs and discussed the procedure including the risks, benefits and alternatives for the proposed anesthesia with the patient or authorized representative who has indicated his/her understanding and acceptance.     Dental advisory given  Plan Discussed with: CRNA  Anesthesia Plan Comments:        Anesthesia Quick Evaluation

## 2019-11-28 ENCOUNTER — Ambulatory Visit (HOSPITAL_COMMUNITY)
Admission: RE | Admit: 2019-11-28 | Discharge: 2019-11-28 | Disposition: A | Discharge status: Home / Self Care | Payer: No Typology Code available for payment source | Attending: Pulmonary Disease | Admitting: Pulmonary Disease

## 2019-11-28 ENCOUNTER — Encounter (HOSPITAL_COMMUNITY)
Admission: RE | Disposition: A | Discharge status: Home / Self Care | Payer: Self-pay | Source: Home / Self Care | Attending: Pulmonary Disease

## 2019-11-28 ENCOUNTER — Ambulatory Visit (HOSPITAL_COMMUNITY): Payer: No Typology Code available for payment source

## 2019-11-28 ENCOUNTER — Ambulatory Visit (HOSPITAL_COMMUNITY): Payer: No Typology Code available for payment source | Admitting: Anesthesiology

## 2019-11-28 ENCOUNTER — Encounter (HOSPITAL_COMMUNITY): Payer: Self-pay | Admitting: Pulmonary Disease

## 2019-11-28 ENCOUNTER — Other Ambulatory Visit: Payer: Self-pay

## 2019-11-28 DIAGNOSIS — R918 Other nonspecific abnormal finding of lung field: Secondary | ICD-10-CM | POA: Diagnosis present

## 2019-11-28 DIAGNOSIS — R911 Solitary pulmonary nodule: Secondary | ICD-10-CM

## 2019-11-28 DIAGNOSIS — Z01818 Encounter for other preprocedural examination: Secondary | ICD-10-CM

## 2019-11-28 DIAGNOSIS — Z9889 Other specified postprocedural states: Secondary | ICD-10-CM

## 2019-11-28 HISTORY — PX: BRONCHIAL WASHINGS: SHX5105

## 2019-11-28 HISTORY — PX: BRONCHIAL BRUSHINGS: SHX5108

## 2019-11-28 HISTORY — PX: VIDEO BRONCHOSCOPY WITH ENDOBRONCHIAL NAVIGATION: SHX6175

## 2019-11-28 HISTORY — PX: BRONCHIAL BIOPSY: SHX5109

## 2019-11-28 HISTORY — PX: FIDUCIAL MARKER PLACEMENT: SHX6858

## 2019-11-28 HISTORY — PX: BRONCHIAL NEEDLE ASPIRATION BIOPSY: SHX5106

## 2019-11-28 LAB — COMPREHENSIVE METABOLIC PANEL
ALT: 21 U/L (ref 0–44)
AST: 21 U/L (ref 15–41)
Albumin: 4.3 g/dL (ref 3.5–5.0)
Alkaline Phosphatase: 46 U/L (ref 38–126)
Anion gap: 8 (ref 5–15)
BUN: 15 mg/dL (ref 6–20)
CO2: 28 mmol/L (ref 22–32)
Calcium: 9.2 mg/dL (ref 8.9–10.3)
Chloride: 104 mmol/L (ref 98–111)
Creatinine, Ser: 0.79 mg/dL (ref 0.44–1.00)
GFR, Estimated: >60 mL/min (ref >60)
Glucose, Bld: 96 mg/dL (ref 70–99)
Potassium: 3.7 mmol/L (ref 3.5–5.1)
Sodium: 140 mmol/L (ref 135–145)
Total Bilirubin: 1 mg/dL (ref 0.3–1.2)
Total Protein: 6.8 g/dL (ref 6.5–8.1)

## 2019-11-28 LAB — CBC
HCT: 42.5 % (ref 36.0–46.0)
Hemoglobin: 13.9 g/dL (ref 12.0–15.0)
MCH: 28.6 pg (ref 26.0–34.0)
MCHC: 32.7 g/dL (ref 30.0–36.0)
MCV: 87.4 fL (ref 80.0–100.0)
Platelets: 154 10*3/uL (ref 150–400)
RBC: 4.86 MIL/uL (ref 3.87–5.11)
RDW: 12.4 % (ref 11.5–15.5)
WBC: 2.6 10*3/uL — ABNORMAL LOW (ref 4.0–10.5)
nRBC: 0 % (ref 0.0–0.2)

## 2019-11-28 LAB — PROTIME-INR
INR: 1 (ref 0.8–1.2)
Prothrombin Time: 12.9 seconds (ref 11.4–15.2)

## 2019-11-28 LAB — APTT: aPTT: 30 seconds (ref 24–36)

## 2019-11-28 SURGERY — VIDEO BRONCHOSCOPY WITH ENDOBRONCHIAL NAVIGATION
Anesthesia: General

## 2019-11-28 MED ORDER — LACTATED RINGERS IV SOLN: INTRAVENOUS | Status: DC

## 2019-11-28 MED ORDER — ROCURONIUM BROMIDE 10 MG/ML (PF) SYRINGE
PREFILLED_SYRINGE | INTRAVENOUS | Status: DC | PRN
  Administered 2019-11-28: 40 mg via INTRAVENOUS
  Administered 2019-11-28: 20 mg via INTRAVENOUS

## 2019-11-28 MED ORDER — SUGAMMADEX SODIUM 200 MG/2ML IV SOLN
INTRAVENOUS | Status: DC | PRN
  Administered 2019-11-28: 120 mg via INTRAVENOUS

## 2019-11-28 MED ORDER — PHENYLEPHRINE 40 MCG/ML (10ML) SYRINGE FOR IV PUSH (FOR BLOOD PRESSURE SUPPORT)
PREFILLED_SYRINGE | INTRAVENOUS | Status: DC | PRN
  Administered 2019-11-28: 120 ug via INTRAVENOUS
  Administered 2019-11-28: 160 ug via INTRAVENOUS

## 2019-11-28 MED ORDER — CHLORHEXIDINE GLUCONATE 0.12 % MT SOLN
OROMUCOSAL | Status: AC
  Administered 2019-11-28: 15 mL
  Filled 2019-11-28: qty 15

## 2019-11-28 MED ORDER — PROPOFOL 10 MG/ML IV BOLUS
INTRAVENOUS | Status: DC | PRN
  Administered 2019-11-28: 120 mg via INTRAVENOUS

## 2019-11-28 MED ORDER — MIDAZOLAM HCL 2 MG/2ML IJ SOLN
INTRAMUSCULAR | Status: DC | PRN
  Administered 2019-11-28: 2 mg via INTRAVENOUS

## 2019-11-28 MED ORDER — FENTANYL CITRATE (PF) 100 MCG/2ML IJ SOLN
INTRAMUSCULAR | Status: DC | PRN
  Administered 2019-11-28 (×2): 50 ug via INTRAVENOUS

## 2019-11-28 MED ORDER — ONDANSETRON HCL 4 MG/2ML IJ SOLN
INTRAMUSCULAR | Status: DC | PRN
  Administered 2019-11-28: 4 mg via INTRAVENOUS

## 2019-11-28 MED ORDER — DEXAMETHASONE SODIUM PHOSPHATE 10 MG/ML IJ SOLN
INTRAMUSCULAR | Status: DC | PRN
  Administered 2019-11-28: 5 mg via INTRAVENOUS

## 2019-11-28 MED ORDER — LIDOCAINE 2% (20 MG/ML) 5 ML SYRINGE
INTRAMUSCULAR | Status: DC | PRN
  Administered 2019-11-28: 100 mg via INTRAVENOUS

## 2019-11-28 MED ORDER — EPHEDRINE SULFATE-NACL 50-0.9 MG/10ML-% IV SOSY
PREFILLED_SYRINGE | INTRAVENOUS | Status: DC | PRN
  Administered 2019-11-28: 10 mg via INTRAVENOUS
  Administered 2019-11-28: 5 mg via INTRAVENOUS
  Administered 2019-11-28: 10 mg via INTRAVENOUS

## 2019-11-28 SURGICAL SUPPLY — 47 items

## 2019-11-28 NOTE — Transfer of Care (Signed)
Immediate Anesthesia Transfer of Care Note  Patient: Laura Ritter  Procedure(s) Performed: VIDEO BRONCHOSCOPY WITH ENDOBRONCHIAL NAVIGATION (N/A ) BRONCHIAL BRUSHINGS BRONCHIAL BIOPSIES BRONCHIAL NEEDLE ASPIRATION BIOPSIES FIDUCIAL MARKER PLACEMENT BRONCHIAL WASHINGS  Patient Location: PACU  Anesthesia Type:General  Level of Consciousness: awake and oriented  Airway & Oxygen Therapy: Patient Spontanous Breathing  Post-op Assessment: Report given to RN  Post vital signs: Reviewed and stable  Last Vitals:  Vitals Value Taken Time  BP 112/52 11/28/19 0939  Temp    Pulse 78 11/28/19 0939  Resp 17 11/28/19 0939  SpO2 97 % 11/28/19 0939  Vitals shown include unvalidated device data.  Last Pain:  Vitals:   11/28/19 0644  TempSrc:   PainSc: 0-No pain      Patients Stated Pain Goal: 3 (80/69/99 6722)  Complications: No complications documented.

## 2019-11-28 NOTE — Interval H&P Note (Signed)
History and Physical Interval Note:  11/28/2019 8:00 AM  Laura Ritter  has presented today for surgery, with the diagnosis of LUNG NODULE.  The various methods of treatment have been discussed with the patient and family. After consideration of risks, benefits and other options for treatment, the patient has consented to  Procedure(s): [place] (N/A) as a surgical intervention.  The patient's history has been reviewed, patient examined, no change in status, stable for surgery.  I have reviewed the patient's chart and labs.  Questions were answered to the patient's satisfaction.     [place]

## 2019-11-28 NOTE — Anesthesia Procedure Notes (Signed)
Procedure Name: Intubation Date/Time: 11/28/2019 8:05 AM Performed by: Barrington Ellison, CRNA Pre-anesthesia Checklist: Patient identified, Emergency Drugs available, Suction available and Patient being monitored Patient Re-evaluated:Patient Re-evaluated prior to induction Oxygen Delivery Method: Circle System Utilized Preoxygenation: Pre-oxygenation with 100% oxygen Induction Type: IV induction Ventilation: Mask ventilation without difficulty Laryngoscope Size: Mac and 3 Grade View: Grade I Tube type: Oral Tube size: 8.0 mm Number of attempts: 1 Airway Equipment and Method: Stylet and Oral airway Placement Confirmation: ETT inserted through vocal cords under direct vision,  positive ETCO2 and breath sounds checked- equal and bilateral Secured at: 20 cm Tube secured with: Tape Dental Injury: Teeth and Oropharynx as per pre-operative assessment

## 2019-11-28 NOTE — H&P (View-Only) (Signed)
NAME:  Laura Ritter, MRN:  [id number], DOB:  May 15, 1965, LOS: 0 ADMISSION DATE:  11/28/2019, CONSULTATION DATE: 11/28/2019 REFERRING MD: Dr. Vaughan Browner, CHIEF COMPLAINT: Multiple lung nodules  History of present illness   This is a 54 year old female past medical history of renal stones, migraines multiple lung nodules dating back since 2010. She had work-up back then including immunology CTD serologies as well as fungal evaluation. She has had multiple images throughout the years following up this lung nodule. A 2019 CT scan of the chest revealed a dominant right lower lobe nodule that was worrisome for malignancy at the time and a question of indolent neoplasm. Decision was made for follow-up regarding this nodule. She had a repeat noncontrasted CT of the chest in October 2021. The dominant nodule within the lower lobe was concerning for malignancy. The right lower lobe nodule measures 13 x 14 mm several other small subcentimeter nodules. This has grown since the 2019 imaging.  Past Medical History   Past Medical History:  Diagnosis Date  . History of kidney stones 05/2007   followed yearly follow up- Dr. Jeffie Pollock  . Leukopenia   . PVC (premature ventricular contraction) 04/01/2015   anxiety related  . Screening for HIV (human immunodeficiency virus)      Objective   Blood pressure 119/63, pulse (!) 59, temperature 97.6 F (36.4 C), temperature source Oral, resp. rate 17, height 5\' 7"  (1.702 m), weight 61.2 kg, last menstrual period 01/12/2014, SpO2 100 %.       No intake or output data in the 24 hours ending 11/28/19 0751 Filed Weights   11/26/19 1803 11/28/19 0603  Weight: 61.2 kg 61.2 kg    Examination: General: Middle-aged female, no acute distress resting in bed HENT: NCAT, tracking appropriately Lungs: Clear to auscultation bilaterally Cardiovascular: Regular rate rhythm S1-S2 Abdomen: Soft nontender nondistended Extremities: No significant edema Neuro: Alert oriented  following command, no deficit GU: Deferred   Assessment & Plan:   Multiple pulmonary nodules Dominant right lower lobe pulmonary nodule Smaller peripheral lesion within the left upper lobe. Plan: Decision made for navigational bronchoscopy with tissue sampling. We discussed the risk benefits and alternatives of proceeding to include bleeding and pneumothorax. Patient is agreeable to proceed. All questions answered. We appreciate the consultation.  Labs   CBC: Recent Labs  Lab 11/28/19 0553  WBC 2.6*  HGB 13.9  HCT 42.5  MCV 87.4  PLT 790    Basic Metabolic Panel: Recent Labs  Lab 11/28/19 0553  NA 140  K 3.7  CL 104  CO2 28  GLUCOSE 96  BUN 15  CREATININE 0.79  CALCIUM 9.2   GFR: Estimated Creatinine Clearance: 78.6 mL/min (by C-G formula based on SCr of 0.79 mg/dL). Recent Labs  Lab 11/28/19 0553  WBC 2.6*    Liver Function Tests: Recent Labs  Lab 11/28/19 0553  AST 21  ALT 21  ALKPHOS 46  BILITOT 1.0  PROT 6.8  ALBUMIN 4.3   No results for input(s): LIPASE, AMYLASE in the last 168 hours. No results for input(s): AMMONIA in the last 168 hours.  ABG    Component Value Date/Time   TCO2 29 11/16/2007 1700     Coagulation Profile: Recent Labs  Lab 11/28/19 0553  INR 1.0    Cardiac Enzymes: No results for input(s): CKTOTAL, CKMB, CKMBINDEX, TROPONINI in the last 168 hours.  HbA1C: No results found for: HGBA1C  CBG: No results for input(s): GLUCAP in the last 168 hours.  Review of  Systems:   Review of Systems  Constitutional: Negative for chills, fever, malaise/fatigue and weight loss.  HENT: Negative for hearing loss, sore throat and tinnitus.   Eyes: Negative for blurred vision and double vision.  Respiratory: Negative for cough, hemoptysis, sputum production, shortness of breath, wheezing and stridor.   Cardiovascular: Negative for chest pain, palpitations, orthopnea, leg swelling and PND.  Gastrointestinal: Negative for  abdominal pain, constipation, diarrhea, heartburn, nausea and vomiting.  Genitourinary: Negative for dysuria, hematuria and urgency.  Musculoskeletal: Negative for joint pain and myalgias.  Skin: Negative for itching and rash.  Neurological: Negative for dizziness, tingling, weakness and headaches.  Endo/Heme/Allergies: Negative for environmental allergies. Does not bruise/bleed easily.  Psychiatric/Behavioral: Negative for depression. The patient is not nervous/anxious and does not have insomnia.   All other systems reviewed and are negative.    Past Medical History  She,  has a past medical history of History of kidney stones (05/2007), Leukopenia, PVC (premature ventricular contraction) (04/01/2015), and Screening for HIV (human immunodeficiency virus).   Surgical History    Past Surgical History:  Procedure Laterality Date  . BACK SURGERY  1/16   herniated disc  . CESAREAN SECTION     x3  . CHOLECYSTECTOMY  2001  . COLONOSCOPY    . EXTRACORPOREAL SHOCK WAVE LITHOTRIPSY Right 04/19/2017   Procedure: RIGHT EXTRACORPOREAL SHOCK WAVE LITHOTRIPSY (ESWL);  Surgeon: Ceasar Mons, MD;  Location: [facility];  Service: Urology;  Laterality: Right;  . LAPAROSCOPIC CHOLECYSTECTOMY  2001  . REFRACTIVE SURGERY Bilateral 2006   lasik  . SPINE SURGERY    . WISDOM TOOTH EXTRACTION       Social History   reports that she has never smoked. She has never used smokeless tobacco. She reports current alcohol use of about 4.0 standard drinks of alcohol per week. She reports that she does not use drugs.   Family History   Her family history includes Alcohol abuse in her brother, father, and mother; Alzheimer's disease in her mother; Arthritis in her mother; Hyperlipidemia in her mother; Hypertension in her father; Kidney Stones in her brother; Migraines in her son; Parkinson's disease in her father.   Allergies No Known Allergies   Home Medications  Prior to Admission medications     Medication Sig Start Date End Date Taking? Authorizing Provider  Calcium Carbonate-Vitamin D (CALCIUM 600+D) 600-400 MG-UNIT tablet Take 1 tablet by mouth.    Yes [provider]  Cholecalciferol (VITAMIN D-3) 25 MCG (1000 UT) CAPS Take 2,000 Units by mouth daily.    Yes [provider]  Estradiol 10 MCG TABS vaginal tablet Insert one tablet vaginally twice weekly for vaginal dryness Patient taking differently: Place 10 mcg vaginally 2 (two) times a week. for vaginal dryness 04/18/19  Yes Regina Eck, CNM  Multiple Vitamins-Minerals (MULTIVITAMIN PO) Take 1 tablet by mouth daily.    Yes [provider]  Omega-3 Fatty Acids (FISH OIL PO) Take 1 tablet by mouth daily.    Yes [provider]     Garner Nash, DO [facility] 11/28/2019 7:59 AM

## 2019-11-28 NOTE — Consult Note (Signed)
NAME:  Laura Ritter, MRN:  [id number], DOB:  October 08, 1965, LOS: 0 ADMISSION DATE:  11/28/2019, CONSULTATION DATE: 11/28/2019 REFERRING MD: Dr. Vaughan Browner, CHIEF COMPLAINT: Multiple lung nodules  History of present illness   This is a 54 year old female past medical history of renal stones, migraines multiple lung nodules dating back since 2010. She had work-up back then including immunology CTD serologies as well as fungal evaluation. She has had multiple images throughout the years following up this lung nodule. A 2019 CT scan of the chest revealed a dominant right lower lobe nodule that was worrisome for malignancy at the time and a question of indolent neoplasm. Decision was made for follow-up regarding this nodule. She had a repeat noncontrasted CT of the chest in October 2021. The dominant nodule within the lower lobe was concerning for malignancy. The right lower lobe nodule measures 13 x 14 mm several other small subcentimeter nodules. This has grown since the 2019 imaging.  Past Medical History   Past Medical History:  Diagnosis Date  . History of kidney stones 05/2007   followed yearly follow up- Dr. Jeffie Pollock  . Leukopenia   . PVC (premature ventricular contraction) 04/01/2015   anxiety related  . Screening for HIV (human immunodeficiency virus)      Objective   Blood pressure 119/63, pulse (!) 59, temperature 97.6 F (36.4 C), temperature source Oral, resp. rate 17, height 5\' 7"  (1.702 m), weight 61.2 kg, last menstrual period 01/12/2014, SpO2 100 %.       No intake or output data in the 24 hours ending 11/28/19 0751 Filed Weights   11/26/19 1803 11/28/19 0603  Weight: 61.2 kg 61.2 kg    Examination: General: Middle-aged female, no acute distress resting in bed HENT: NCAT, tracking appropriately Lungs: Clear to auscultation bilaterally Cardiovascular: Regular rate rhythm S1-S2 Abdomen: Soft nontender nondistended Extremities: No significant edema Neuro: Alert oriented  following command, no deficit GU: Deferred   Assessment & Plan:   Multiple pulmonary nodules Dominant right lower lobe pulmonary nodule Smaller peripheral lesion within the left upper lobe. Plan: Decision made for navigational bronchoscopy with tissue sampling. We discussed the risk benefits and alternatives of proceeding to include bleeding and pneumothorax. Patient is agreeable to proceed. All questions answered. We appreciate the consultation.  Labs   CBC: Recent Labs  Lab 11/28/19 0553  WBC 2.6*  HGB 13.9  HCT 42.5  MCV 87.4  PLT 790    Basic Metabolic Panel: Recent Labs  Lab 11/28/19 0553  NA 140  K 3.7  CL 104  CO2 28  GLUCOSE 96  BUN 15  CREATININE 0.79  CALCIUM 9.2   GFR: Estimated Creatinine Clearance: 78.6 mL/min (by C-G formula based on SCr of 0.79 mg/dL). Recent Labs  Lab 11/28/19 0553  WBC 2.6*    Liver Function Tests: Recent Labs  Lab 11/28/19 0553  AST 21  ALT 21  ALKPHOS 46  BILITOT 1.0  PROT 6.8  ALBUMIN 4.3   No results for input(s): LIPASE, AMYLASE in the last 168 hours. No results for input(s): AMMONIA in the last 168 hours.  ABG    Component Value Date/Time   TCO2 29 11/16/2007 1700     Coagulation Profile: Recent Labs  Lab 11/28/19 0553  INR 1.0    Cardiac Enzymes: No results for input(s): CKTOTAL, CKMB, CKMBINDEX, TROPONINI in the last 168 hours.  HbA1C: No results found for: HGBA1C  CBG: No results for input(s): GLUCAP in the last 168 hours.  Review of  Systems:   Review of Systems  Constitutional: Negative for chills, fever, malaise/fatigue and weight loss.  HENT: Negative for hearing loss, sore throat and tinnitus.   Eyes: Negative for blurred vision and double vision.  Respiratory: Negative for cough, hemoptysis, sputum production, shortness of breath, wheezing and stridor.   Cardiovascular: Negative for chest pain, palpitations, orthopnea, leg swelling and PND.  Gastrointestinal: Negative for  abdominal pain, constipation, diarrhea, heartburn, nausea and vomiting.  Genitourinary: Negative for dysuria, hematuria and urgency.  Musculoskeletal: Negative for joint pain and myalgias.  Skin: Negative for itching and rash.  Neurological: Negative for dizziness, tingling, weakness and headaches.  Endo/Heme/Allergies: Negative for environmental allergies. Does not bruise/bleed easily.  Psychiatric/Behavioral: Negative for depression. The patient is not nervous/anxious and does not have insomnia.   All other systems reviewed and are negative.    Past Medical History  She,  has a past medical history of History of kidney stones (05/2007), Leukopenia, PVC (premature ventricular contraction) (04/01/2015), and Screening for HIV (human immunodeficiency virus).   Surgical History    Past Surgical History:  Procedure Laterality Date  . BACK SURGERY  1/16   herniated disc  . CESAREAN SECTION     x3  . CHOLECYSTECTOMY  2001  . COLONOSCOPY    . EXTRACORPOREAL SHOCK WAVE LITHOTRIPSY Right 04/19/2017   Procedure: RIGHT EXTRACORPOREAL SHOCK WAVE LITHOTRIPSY (ESWL);  Surgeon: Ceasar Mons, MD;  Location: [facility];  Service: Urology;  Laterality: Right;  . LAPAROSCOPIC CHOLECYSTECTOMY  2001  . REFRACTIVE SURGERY Bilateral 2006   lasik  . SPINE SURGERY    . WISDOM TOOTH EXTRACTION       Social History   reports that she has never smoked. She has never used smokeless tobacco. She reports current alcohol use of about 4.0 standard drinks of alcohol per week. She reports that she does not use drugs.   Family History   Her family history includes Alcohol abuse in her brother, father, and mother; Alzheimer's disease in her mother; Arthritis in her mother; Hyperlipidemia in her mother; Hypertension in her father; Kidney Stones in her brother; Migraines in her son; Parkinson's disease in her father.   Allergies No Known Allergies   Home Medications  Prior to Admission medications     Medication Sig Start Date End Date Taking? Authorizing Provider  Calcium Carbonate-Vitamin D (CALCIUM 600+D) 600-400 MG-UNIT tablet Take 1 tablet by mouth.    Yes [provider]  Cholecalciferol (VITAMIN D-3) 25 MCG (1000 UT) CAPS Take 2,000 Units by mouth daily.    Yes [provider]  Estradiol 10 MCG TABS vaginal tablet Insert one tablet vaginally twice weekly for vaginal dryness Patient taking differently: Place 10 mcg vaginally 2 (two) times a week. for vaginal dryness 04/18/19  Yes Regina Eck, CNM  Multiple Vitamins-Minerals (MULTIVITAMIN PO) Take 1 tablet by mouth daily.    Yes [provider]  Omega-3 Fatty Acids (FISH OIL PO) Take 1 tablet by mouth daily.    Yes [provider]     Garner Nash, DO [facility] 11/28/2019 7:59 AM

## 2019-11-28 NOTE — Op Note (Signed)
Video Bronchoscopy with Electromagnetic Navigation, fiducial placement procedure Note  Date of Operation: 11/28/2019  Pre-op Diagnosis: Bilateral lung nodules  Post-op Diagnosis: Bilateral lung nodules  Surgeon: Garner Nash, DO   Assistants: None  Anesthesia: General endotracheal anesthesia  Operation: Flexible video fiberoptic bronchoscopy with electromagnetic navigation and biopsies.  Estimated Blood Loss: Minimal  Complications: None  Indications and History: Laura Ritter is a 54 y.o. female with bilateral lung nodules.  The risks, benefits, complications, treatment options and expected outcomes were discussed with the patient.  The possibilities of pneumothorax, pneumonia, reaction to medication, pulmonary aspiration, perforation of a viscus, bleeding, failure to diagnose a condition and creating a complication requiring transfusion or operation were discussed with the patient who freely signed the consent.    Description of Procedure: The patient was seen in the Preoperative Area, was examined and was deemed appropriate to proceed.  The patient was taken to [facility] endoscopy room 2, identified as Laura Ritter and the procedure verified as Flexible Video Fiberoptic Bronchoscopy.  A Time Out was held and the above information confirmed.   Prior to the date of the procedure a high-resolution CT scan of the chest was performed. Utilizing [place] a virtual tracheobronchial tree was generated to allow the creation of distinct navigation pathways to the patient's parenchymal abnormalities. After being taken to the operating room general anesthesia was initiated and the patient  was orally intubated. The video fiberoptic bronchoscope was introduced via the endotracheal tube and a general inspection was performed which showed normal right and left lung anatomy with no evidence of endobronchial lesion.   Target #1 right lower lobe: The extendable working channel and  locator guide were introduced into the bronchoscope. The distinct navigation pathways prepared prior to this procedure were then utilized to navigate to within 1.1 cm of patient's lesion(s) identified on CT scan. The extendable working channel was secured into place and the locator guide was withdrawn. A full fluoroscopic sweep was obtained with inspiratory breath-hold APL 20, RAO 30 degrees to LAO 20 degrees for local registration. Under fluoroscopic guidance transbronchial needle brushings, transbronchial Wang needle biopsies, and transbronchial forceps biopsies were performed to be sent for cytology and pathology. Additional biopsies were sent for tissue culture. At the conclusion of tissue sampling the fiducial guide catheter was placed under fluoroscopic guidance. Approximately 2 cm superior to the lesion a super D gold fiducial marker was placed under fluoroscopic guidance and wire delivery system. A bronchioalveolar lavage was performed in the right lower lobe and sent for cytology and microbiology (bacterial, fungal, AFB smears and cultures).   Target #2 left upper lobe: The extendable working channel and locator guide were introduced into the bronchoscope. The distinct navigation pathways prepared prior to this procedure were then utilized to navigate to within 0.8 cm of patient's lesion(s) identified on CT scan. The extendable working channel was secured into place and the locator guide was withdrawn. Under fluoroscopic guidance transbronchial needle brushings, transbronchial Wang needle biopsies, and transbronchial forceps biopsies were performed to be sent for cytology and pathology. Additional transbronchial biopsies sent for tissue culture. A bronchioalveolar lavage was performed in the left upper lobe and sent for cytology and microbiology (bacterial, fungal, AFB smears and cultures).   The navigational catheter was removed from the therapeutic bronchoscope. Bronchoscope was used for therapeutic  suctioning and aspiration of the bilateral mainstem's removal of any remaining clots and secretions. The bilateral mainstem's and airways were patent. At the end of the procedure a general  airway inspection was performed and there was no evidence of active bleeding. Bronchoscope was returned to to just above the main carina. The bronchoscope was removed.  The patient tolerated the procedure well. There was no significant blood loss and there were no obvious complications. A post-procedural chest x-ray is pending.  Samples target #1 right lower lobe: 1. Transbronchial needle brushings from right lower lobe 2. Transbronchial Wang needle biopsies from right lower lobe 3. Transbronchial forceps biopsies from right lower lobe 4. Bronchoalveolar lavage from right lower lobe  Samples target #2 left upper lobe: 1. Transbronchial needle brushings from left upper lobe 2. Transbronchial Wang needle biopsies from left upper lobe 3. Transbronchial forceps biopsies from left upper lobe 4. Bronchoalveolar lavage from left upper lobe  Plans:  The patient will be discharged from the PACU to home when recovered from anesthesia and after chest x-ray is reviewed. We will review the cytology, pathology and microbiology results with the patient when they become available. Outpatient followup will be with Marshell Garfinkel, MD.   Garner Nash, DO [facility] 11/28/2019 9:45 AM

## 2019-11-28 NOTE — Anesthesia Postprocedure Evaluation (Signed)
Anesthesia Post Note  Patient: Maxima Skelton Corum  Procedure(s) Performed: VIDEO BRONCHOSCOPY WITH ENDOBRONCHIAL NAVIGATION (N/A ) BRONCHIAL BRUSHINGS BRONCHIAL BIOPSIES BRONCHIAL NEEDLE ASPIRATION BIOPSIES FIDUCIAL MARKER PLACEMENT BRONCHIAL WASHINGS     Patient location during evaluation: PACU Anesthesia Type: General Level of consciousness: sedated and patient cooperative Pain management: pain level controlled Vital Signs Assessment: post-procedure vital signs reviewed and stable Respiratory status: spontaneous breathing Cardiovascular status: stable Anesthetic complications: no   No complications documented.  Last Vitals:  Vitals:   11/28/19 0955 11/28/19 1010  BP: 116/67 102/64  Pulse: 77 70  Resp: 20 15  Temp:  36.5 C  SpO2: 99% 100%    Last Pain:  Vitals:   11/28/19 1010  TempSrc:   PainSc: 0-No pain                 Nolon Nations

## 2019-11-28 NOTE — Discharge Instructions (Signed)
Flexible Bronchoscopy, Care After This sheet gives you information about how to care for yourself after your test. Your doctor may also give you more specific instructions. If you have problems or questions, contact your doctor. Follow these instructions at home: Eating and drinking  The day after the test, go back to your normal diet. Driving  Do not drive for 24 hours if you were given a medicine to help you relax (sedative).  Do not drive or use heavy machinery while taking prescription pain medicine. General instructions   Take over-the-counter and prescription medicines only as told by your doctor.  Return to your normal activities as told. Ask what activities are safe for you.  Do not use any products that have nicotine or tobacco in them. This includes cigarettes and e-cigarettes. If you need help quitting, ask your doctor.  Keep all follow-up visits as told by your doctor. This is important. It is very important if you had a tissue sample (biopsy) taken. Get help right away if:  You have shortness of breath that gets worse.  You get light-headed.  You feel like you are going to pass out (faint).  You have chest pain.  You cough up: ? More than a little blood. ? More blood than before. Summary  Do not eat or drink anything (not even water) for 2 hours after your test, or until your numbing medicine wears off.  Do not use cigarettes. Do not use e-cigarettes.  Get help right away if you have chest pain. This information is not intended to replace advice given to you by your health care provider. Make sure you discuss any questions you have with your health care provider. Document Revised: 12/29/2016 Document Reviewed: 02/04/2016 Elsevier Patient Education  2020 [employer].

## 2019-11-30 ENCOUNTER — Encounter (HOSPITAL_COMMUNITY): Payer: Self-pay | Admitting: Pulmonary Disease

## 2019-11-30 LAB — ACID FAST SMEAR (AFB, MYCOBACTERIA)
Acid Fast Smear: NEGATIVE
Acid Fast Smear: NEGATIVE
Acid Fast Smear: NEGATIVE
Acid Fast Smear: NEGATIVE

## 2019-12-01 ENCOUNTER — Encounter (HOSPITAL_COMMUNITY): Payer: Self-pay | Admitting: Pulmonary Disease

## 2019-12-01 LAB — CYTOLOGY - NON PAP

## 2019-12-03 LAB — AEROBIC/ANAEROBIC CULTURE W GRAM STAIN (SURGICAL/DEEP WOUND)
Culture: NO GROWTH
Culture: NO GROWTH
Culture: NO GROWTH

## 2019-12-04 ENCOUNTER — Telehealth: Payer: Self-pay | Admitting: Pulmonary Disease

## 2019-12-04 NOTE — Telephone Encounter (Signed)
PCCM:  I called and spoke with the patient.  Garner Nash, DO [facility] 12/04/2019 6:26 PM

## 2019-12-04 NOTE — Telephone Encounter (Signed)
Patient returning call. Available until 11:45, 12:30-2 and after 3:30.  Please advise.  [phone]

## 2019-12-04 NOTE — Telephone Encounter (Signed)
PCCM:  I called and attempted to speak with the patient regarding her bronchoscopy results. I left a message on her VM.   RLL lesion with "atypical cells" I am not sure what to interpret with this. I will call pathology to discuss.   The multinucleated giant cells could represent this as a granuloma which clinically would make sense in the RLL.   fyi to Dr. Vaughan Browner.     Garner Nash, DO [facility] 12/04/2019 8:51 AM

## 2019-12-15 ENCOUNTER — Ambulatory Visit: Payer: No Typology Code available for payment source

## 2019-12-30 LAB — FUNGUS CULTURE WITH STAIN

## 2019-12-30 LAB — FUNGUS CULTURE RESULT

## 2019-12-30 LAB — FUNGAL ORGANISM REFLEX

## 2020-01-02 ENCOUNTER — Encounter: Payer: Self-pay | Admitting: Pulmonary Disease

## 2020-01-02 ENCOUNTER — Other Ambulatory Visit: Payer: Self-pay

## 2020-01-02 ENCOUNTER — Ambulatory Visit (INDEPENDENT_AMBULATORY_CARE_PROVIDER_SITE_OTHER): Payer: No Typology Code available for payment source | Admitting: Pulmonary Disease

## 2020-01-02 VITALS — BP 114/62 | HR 61 | Temp 97.1°F | Ht 67.0 in | Wt 138.2 lb

## 2020-01-02 DIAGNOSIS — R918 Other nonspecific abnormal finding of lung field: Secondary | ICD-10-CM | POA: Diagnosis not present

## 2020-01-02 DIAGNOSIS — R911 Solitary pulmonary nodule: Secondary | ICD-10-CM

## 2020-01-02 NOTE — Patient Instructions (Signed)
We will get some additional blood test to see if he had a prior fungal infection that can explain the lung nodules I will review your case at our conference to see if there are additional recommendations Follow-up CT without contrast in 1 year  Follow-up in clinic after CT

## 2020-01-02 NOTE — Progress Notes (Addendum)
Laura Ritter    [id number]    08-Oct-1965  Primary Care Physician:Kuneff, Reinaldo Raddle, DO  Referring Physician: Ma Hillock, DO [address] [place],  [place] [address]  Chief complaint:  Follow up for abnormal CT scan.  HPI: Laura Ritter is a 54 Y/O with PMH of renal stones, migraines. She has H/O multiple pulmonary nodules dating back to 2010. They were first noted in 2010 during a CT of the abd. She has multiple follow CT scans that showed stability and atleast one PET scan that showed mild activity. She was being followed by pulmonay at [facility] and had a workup including immunology, CTD and serologies for fungal diseases.    She was seen by cardiology for palpitations but no intervention was needed. She was born in [place] but lived in [place] for most of her life. No significant travel history. No family history of lung disease and no symptoms suggestive of autoimmune or CTD. She is a non smoker and is uptodate with her age appropriate cancer screening. 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 No known exposures at work and home.  She has a dog and a cat, no birds  Interim History: Last CT scan shows mild increase in size and she underwent bronchoscopy with navigational biopsy by Dr. Valeta Harms on 10/29 with negative cultures, path/cytology showing multinucleated giant cells and tiny cluster of atypical cells.  Postprocedure she continues to do well with no symptoms.  Outpatient Encounter Medications as of 01/02/2020  Medication Sig  . Calcium Carbonate-Vitamin D (CALCIUM 600+D) 600-400 MG-UNIT tablet Take 1 tablet by mouth.   . Cholecalciferol (VITAMIN D-3) 25 MCG (1000 UT) CAPS Take 2,000 Units by mouth daily.   . Estradiol 10 MCG TABS vaginal tablet Insert one tablet vaginally twice weekly for vaginal dryness (Patient taking differently: Place 10 mcg vaginally 2 (two) times a week. for vaginal dryness)  . Multiple Vitamins-Minerals  (MULTIVITAMIN PO) Take 1 tablet by mouth daily.   . Omega-3 Fatty Acids (FISH OIL PO) Take 1 tablet by mouth daily.    No facility-administered encounter medications on file as of 01/02/2020.   Physical Exam: Blood pressure 118/78, pulse 62, height 5\' 7"  (1.702 m), weight 135 lb 9.6 oz (61.5 kg), last menstrual period 01/12/2014, SpO2 98 %. Gen:      No acute distress HEENT:  EOMI, sclera anicteric Neck:     No masses; no thyromegaly Lungs:    Clear to auscultation bilaterally; normal respiratory effort CV:         Regular rate and rhythm; no murmurs Abd:      + bowel sounds; soft, non-tender; no palpable masses, no distension Ext:    No edema; adequate peripheral perfusion Skin:      Warm and dry; no rash Neuro: alert and oriented x 3 Psych: normal mood and affect  Data Reviewed: Imaging: CT abd 05/15/08- B/L basal lung nodules CT scan chest 09/11/08- Multiple B/L pulmonary nodule ranging from 5-13 PET scan 09/30/08- Pulmonary nodules with mildly elevated activity. CT scan chest 04/01/09- Persistent pulmonary nodules. PET scan 04/09/09- Negative as per patient. No report available to review CT scan chest 10/14/09- Stable pulmonary nodules CT scan chest 09/22/10- Stable pulmonary nodules CT scan chest 03/18/15- Pulmonary nodules some larger and some smaller compared to 2010. Largest is 10cm CT scan chest 10/01/15- Multiple pulmonary nodules. Some with small enlargement. Largest is 11 mm.  PET Scan 10/20/15- Low level uptake. CT scan 11/21/2017- right lower lobe nodule has slightly enlarged.  Additional bilateral pulmonary nodules within indolent growth. CT chest 11/13/2019-right lower lobe dominant nodule appears more solid than on prior study, other lung nodules are stable.  I have reviewed the images personally.  Labs Serology ACE 07/07/13- 52, 2/617- 54 ANA 07/13/14- Neg RA, CCP 07/13/14- Neg  Sed rate 07/13/14- 6, 03/08/15- 1  [employer] results Nodify XL2 11/28/17 Results reviewed.   Posttest probability of malignancy less than 1%.  Assessment:  Multiple pulmonary nodules. These have been largely stable since 2010 with mild uptake in activity on PET scan in 2017. CT scans shows a waxing and waning pattern indicative of a inflammatory nature. Sarcoid is possible but ACE level is only very mildly elevated   Reviewed the last CT scan which shows that the right lower lobe nodule is slightly bigger.  Cannot rule out low-grade adenocarcinoma.  She underwent bronchoscopy with no clear evidence of malignancy though there are multinucleated giant cells and atypical cells  We will get serologies for histoplasmosis, coccidiomycosis given her childhood in [place].  I will refer her to multidisciplinary tumor board for evaluation. Suspect findings are benign and we will get a follow-up CT in 1 year unless the recommendations from the tumor board are different.  Plan/Recommendations: - Fungal antibodies for prior infection - Follow-up CT in 1 year - Referral for [facility] discussion  Marshell Garfinkel MD [facility] 01/02/2020, 9:25 AM  CC: Kuneff, Renee A, DO

## 2020-01-02 NOTE — Progress Notes (Deleted)
Laura Ritter    [id number]    Aug 22, 1965  Primary Care Physician:Kuneff, Reinaldo Raddle, DO  Referring Physician: Ma Hillock, DO [address] [place],  [place] [address]  Chief complaint:  Follow up for abnormal CT scan.  HPI: Laura Ritter is a 54 Y/O with PMH of renal stones, migraines. She has H/O multiple pulmonary nodules dating back to 2010. They were first noted in 2010 during a CT of the abd. She has multiple follow CT scans that showed stability and atleast one PET scan that showed mild activity. She was being followed by pulmonay at [facility] and had a workup including immunology, CTD and serologies for fungal diseases.    She was seen by cardiology for palpitations but no intervention was needed. She was born in [place] but lived in [place] for most of her life. No significant travel history. No family history of lung disease and no symptoms suggestive of autoimmune or CTD. She is a non smoker and is uptodate with her age appropriate cancer screening. 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 No known exposures at work and home.  Interim History: She feels well with no respiratory complaints. She's had a CT scan which showed very mild enlargement of her nodules. A subsequent PET scan showed evidence low level activity.  Outpatient Encounter Medications as of 01/02/2020  Medication Sig  . Calcium Carbonate-Vitamin D (CALCIUM 600+D) 600-400 MG-UNIT tablet Take 1 tablet by mouth.   . Cholecalciferol (VITAMIN D-3) 25 MCG (1000 UT) CAPS Take 2,000 Units by mouth daily.   . Estradiol 10 MCG TABS vaginal tablet Insert one tablet vaginally twice weekly for vaginal dryness (Patient taking differently: Place 10 mcg vaginally 2 (two) times a week. for vaginal dryness)  . Multiple Vitamins-Minerals (MULTIVITAMIN PO) Take 1 tablet by mouth daily.   . Omega-3 Fatty Acids (FISH OIL PO) Take 1 tablet by mouth daily.    No facility-administered  encounter medications on file as of 01/02/2020.   Physical Exam: Blood pressure 118/78, pulse 62, height 5\' 7"  (1.702 m), weight 135 lb 9.6 oz (61.5 kg), last menstrual period 01/12/2014, SpO2 98 %. Gen:      No acute distress HEENT:  EOMI, sclera anicteric Neck:     No masses; no thyromegaly Lungs:    Clear to auscultation bilaterally; normal respiratory effort CV:         Regular rate and rhythm; no murmurs Abd:      + bowel sounds; soft, non-tender; no palpable masses, no distension Ext:    No edema; adequate peripheral perfusion Skin:      Warm and dry; no rash Neuro: alert and oriented x 3 Psych: normal mood and affect  Data Reviewed: CT abd 05/15/08- B/L basal lung nodules CT scan chest 09/11/08- Multiple B/L pulmonary nodule ranging from 5-13 PET scan 09/30/08- Pulmonary nodules with mildly elevated activity. CT scan chest 04/01/09- Persistent pulmonary nodules. PET scan 04/09/09- Negative as per patient. No report available to review CT scan chest 10/14/09- Stable pulmonary nodules CT scan chest 09/22/10- Stable pulmonary nodules CT scan chest 03/18/15- Pulmonary nodules some larger and some smaller compared to 2010. Largest is 10cm CT scan chest 10/01/15- Multiple pulmonary nodules. Some with small enlargement. Largest is 11 mm.  PET Scan 10/20/15- Low level uptake. CT scan 11/21/2017- right lower lobe nodule has slightly enlarged.  Additional bilateral pulmonary nodules within indolent growth. I  have reviewed the images personally.  Serology ACE 07/07/13- 52, 2/617- 54 ANA 07/13/14- Neg RA, CCP 07/13/14- Neg  Sed rate 07/13/14- 6, 03/08/15- 1  Assessment:  Multiple pulmonary nodules. These have been largely stable since 2010 with mild uptake in activity on PET scan in 2010. CT scans shows a waxing and waning pattern indicative of a inflammatory nature. [place] work [place] and [place] that was negative and sed rate is low. Sarcoid is possible but ACE level is only very mildly  elevated   Reviewed the last CT scan which shows that the right lower lobe nodule is slightly bigger.  Cannot rule out low-grade adenocarcinoma Given these findings I have recommended a PET scan.  She would like to hold off until she can go on the new insurance We will also get blood test for [employer] nodify xl2 study to get more information on the lung nodule,   Plan/Recommendations: - B[employer] nodify xl2 blood test  Marshell Garfinkel MD [facility] 01/02/2020, 9:08 AM  CC: Kuneff, Renee A, DO

## 2020-01-06 LAB — COCCIDIOIDES ANTIBODIES: COCCIDIOIDES AB, CF, SERUM: <1:2 {titer}

## 2020-01-06 LAB — HISTOPLASMA ANTIBODIES: HISTOPLASMA ANTIBODY IMMUNODIFFUSION: NEGATIVE

## 2020-01-08 ENCOUNTER — Telehealth: Payer: Self-pay | Admitting: Pulmonary Disease

## 2020-01-08 ENCOUNTER — Other Ambulatory Visit: Payer: Self-pay | Admitting: *Deleted

## 2020-01-08 ENCOUNTER — Encounter: Payer: Self-pay | Admitting: *Deleted

## 2020-01-08 DIAGNOSIS — R911 Solitary pulmonary nodule: Secondary | ICD-10-CM

## 2020-01-08 NOTE — Progress Notes (Signed)
The proposed treatment discussed in cancer conference 12/9 is for discussion purpose only and is not a binding recommendation.  The patient was not physically examined nor present for their treatment options.  Therefore, final treatment plans cannot be decided.  

## 2020-01-08 NOTE — Telephone Encounter (Signed)
Discussed with Dr. Valeta Harms regarding tumor board discussion  There is concern for malignancy on review of CTs by Dr. Weber Cooks, radiology given growth and change in density of nodule.  Recommendation is to be evaluated by Dr. Roxan Hockey, CT surgery for resection  I have called and discussed this over telephone with the patient Referral made for surgery evaluation and PFTs  Marshell Garfinkel MD [facility] 01/08/2020, 10:22 AM

## 2020-01-09 LAB — BLASTOMYCES ANTIGEN: Blastomyces Antigen: NOT DETECTED ng/mL

## 2020-01-11 LAB — ACID FAST CULTURE WITH REFLEXED SENSITIVITIES (MYCOBACTERIA)
Acid Fast Culture: NEGATIVE
Acid Fast Culture: NEGATIVE
Acid Fast Culture: NEGATIVE

## 2020-01-12 ENCOUNTER — Institutional Professional Consult (permissible substitution): Payer: No Typology Code available for payment source | Admitting: Thoracic Surgery (Cardiothoracic Vascular Surgery)

## 2020-01-12 ENCOUNTER — Encounter: Payer: Self-pay | Admitting: Thoracic Surgery (Cardiothoracic Vascular Surgery)

## 2020-01-12 ENCOUNTER — Other Ambulatory Visit: Payer: Self-pay

## 2020-01-12 VITALS — BP 122/83 | HR 73 | Resp 18 | Ht 67.0 in | Wt 135.0 lb

## 2020-01-12 DIAGNOSIS — R911 Solitary pulmonary nodule: Secondary | ICD-10-CM

## 2020-01-12 NOTE — Progress Notes (Signed)
PCP is Ma Hillock, DO Referring Provider is Marshell Garfinkel, MD  Chief Complaint  Patient presents with  . Consult    Initial surgical consult  . Lung Lesion    Pulmonary nodules, CT chest 11/13/19,  Bronch/nav 11/28/19    HPI: [name] is sent for consultation regarding lung nodules.  Laura Ritter is a 54 year old woman with a past history significant for kidney stones, osteoarthritis, leukopenia, and PVCs.  She was first found to have lung nodules back in 2010 when she had a scan for kidney stones.  Those have been followed over time.  There is been some waxing and waning of nodules.  In 2017 she had a PET/CT which showed some mild metabolic activity.  Work-up has included fungal antigen panels.  All work-up has been nondiagnostic.  In October she had a CT which showed progression of a basilar right lower lobe lung nodule.  Dr. Valeta Harms did a navigational bronchoscopy.  FNA showed some occasional foreign body giant cells and rare atypical cells.  AFB and fungal cultures were negative.  She is a lifelong non-smoker.  She is not having any fevers chills or sweats.  She has no known exposures.  She did grow up in the [facility]  She works as a Community education officer   Past Medical History:  Diagnosis Date  . History of kidney stones 05/2007   followed yearly follow up- Dr. Jeffie Pollock  . Leukopenia   . PVC (premature ventricular contraction) 04/01/2015   anxiety related  . Screening for HIV (human immunodeficiency virus)     Past Surgical History:  Procedure Laterality Date  . BACK SURGERY  1/16   herniated disc  . BRONCHIAL BIOPSY  11/28/2019   Procedure: BRONCHIAL BIOPSIES;  Surgeon: Garner Nash, DO;  Location: [facility];  Service: Pulmonary;;  . BRONCHIAL BRUSHINGS  11/28/2019   Procedure: BRONCHIAL BRUSHINGS;  Surgeon: Garner Nash, DO;  Location: [facility];  Service: Pulmonary;;  . BRONCHIAL NEEDLE ASPIRATION BIOPSY  11/28/2019   Procedure: BRONCHIAL  NEEDLE ASPIRATION BIOPSIES;  Surgeon: Garner Nash, DO;  Location: [place] [facility];  Service: Pulmonary;;  . BRONCHIAL WASHINGS  11/28/2019   Procedure: BRONCHIAL WASHINGS;  Surgeon: Garner Nash, DO;  Location: [facility];  Service: Pulmonary;;  . CESAREAN SECTION     x3  . CHOLECYSTECTOMY  2001  . COLONOSCOPY    . EXTRACORPOREAL SHOCK WAVE LITHOTRIPSY Right 04/19/2017   Procedure: RIGHT EXTRACORPOREAL SHOCK WAVE LITHOTRIPSY (ESWL);  Surgeon: Ceasar Mons, MD;  Location: [facility];  Service: Urology;  Laterality: Right;  . FIDUCIAL MARKER PLACEMENT  11/28/2019   Procedure: FIDUCIAL MARKER PLACEMENT;  Surgeon: Garner Nash, DO;  Location: [place] [place] [facility];  Service: Pulmonary;;  . LAPAROSCOPIC CHOLECYSTECTOMY  2001  . REFRACTIVE SURGERY Bilateral 2006   lasik  . SPINE SURGERY    . VIDEO BRONCHOSCOPY WITH ENDOBRONCHIAL NAVIGATION N/A 11/28/2019   Procedure: VIDEO BRONCHOSCOPY WITH ENDOBRONCHIAL NAVIGATION;  Surgeon: Garner Nash, DO;  Location: [facility];  Service: Pulmonary;  Laterality: N/A;  . WISDOM TOOTH EXTRACTION      Family History  Problem Relation Age of Onset  . Hyperlipidemia Mother   . Alcohol abuse Mother   . Arthritis Mother   . Alzheimer's disease Mother   . Hypertension Father   . Parkinson's disease Father   . Alcohol abuse Father   . Kidney Stones Brother   . Alcohol abuse Brother   . Migraines Son     Social History  Social History   Tobacco Use  . Smoking status: Never Smoker  . Smokeless tobacco: Never Used  Vaping Use  . Vaping Use: Never used  Substance Use Topics  . Alcohol use: Yes    Alcohol/week: 4.0 standard drinks    Types: 4 Standard drinks or equivalent per week  . Drug use: No    Current Outpatient Medications  Medication Sig Dispense Refill  . Calcium Carbonate-Vitamin D 600-400 MG-UNIT tablet Take 1 tablet by mouth.     . Cholecalciferol (VITAMIN D-3) 25 MCG (1000 UT) CAPS Take 2,000 Units by mouth daily.      . Estradiol 10 MCG TABS vaginal tablet Insert one tablet vaginally twice weekly for vaginal dryness (Patient taking differently: Place 10 mcg vaginally 2 (two) times a week. for vaginal dryness) 24 tablet 3  . Multiple Vitamins-Minerals (MULTIVITAMIN PO) Take 1 tablet by mouth daily.     . Omega-3 Fatty Acids (FISH OIL PO) Take 1 tablet by mouth daily.      No current facility-administered medications for this visit.    No Known Allergies  Review of Systems  Constitutional: Negative for activity change and unexpected weight change.  Respiratory: Negative for cough and shortness of breath.   Cardiovascular: Positive for palpitations. Negative for chest pain and leg swelling.  Musculoskeletal: Positive for arthralgias.  All other systems reviewed and are negative.   BP 122/83 (BP Location: Right Arm, Patient Position: Sitting)   Pulse 73   Resp 18   Ht 5\' 7"  (1.702 m)   Wt 135 lb (61.2 kg)   LMP 01/12/2014 (LMP Unknown)   SpO2 98% Comment: RA with mask on  BMI 21.14 kg/m  Physical Exam Vitals reviewed.  Constitutional:      General: She is not in acute distress.    Appearance: Normal appearance.  HENT:     Head: Normocephalic and atraumatic.  Eyes:     General: No scleral icterus. Cardiovascular:     Rate and Rhythm: Normal rate and regular rhythm.     Heart sounds: Normal heart sounds. No murmur heard. No friction rub. No gallop.   Pulmonary:     Effort: Pulmonary effort is normal. No respiratory distress.     Breath sounds: Normal breath sounds. No wheezing or rales.  Musculoskeletal:     Cervical back: Neck supple.  Lymphadenopathy:     Cervical: No cervical adenopathy.  Skin:    General: Skin is warm and dry.  Neurological:     General: No focal deficit present.     Mental Status: She is alert and oriented to person, place, and time.     Cranial Nerves: No cranial nerve deficit.     Motor: No weakness.    Diagnostic Tests: CT CHEST WITHOUT  CONTRAST  TECHNIQUE: Multidetector CT imaging of the chest was performed following the standard protocol without IV contrast.  COMPARISON:  CT chest of November 21, 2017  FINDINGS: Cardiovascular: Heart size and aortic caliber are normal. Small pericardial effusion similar to the prior study. No atheromatous plaque. Central pulmonary vasculature is of normal caliber. Limited assessment of cardiovascular structures given lack of intravenous contrast.  Mediastinum/Nodes: Thoracic inlet structures are normal.  No axillary lymphadenopathy. No mediastinal lymphadenopathy. No gross hilar lymphadenopathy.  Lungs/Pleura: Enlarging pulmonary nodules.  RIGHT lower lobe nodule measuring 13 x 14 mm includes adjacent nodular density which is now coalescent with the dominant nodule. This previously measured approximately 13 x 11 mm and appears more solid than on  the prior study. (Image 118, series 3)  Sub solid nodule in the RIGHT lung base (image 125, series 3) 8 mm previously 8 mm.  (Image 101, series 3) 11 x 7 mm nodule in the lingula previously 11 x 8 mmo.  (Image 102, series 3) 3 mm ground-glass nodule new from 2019.  Pleural based nodule on image 122 of series 3 is quite small at 5 mm but is larger than in 2019 where it measured approximately 2-3 mm.  Adjacent 3 mm nodule also slightly larger seen on image 123 of series 3.  New pulmonary nodule on image 112 of series 3 measures 4 mm  9 mm nodule along the major fissure in the RIGHT chest unchanged (image 133, series 3)  Upper Abdomen: Imaged portions of upper abdominal contents, visualized portions of liver, spleen, pancreas, adrenal glands and kidneys without acute process. Post cholecystectomy.  Musculoskeletal: Spinal degenerative changes without acute or destructive bone process.  IMPRESSION: 1. Dominant pulmonary nodule in particular is suspicious for bronchogenic neoplasm. 2. Smaller nodules  described above some of which are new and some enlarged since 2019 3. Other pulmonary nodules despite lack of significant growth since 2019 raise the question of indolent multifocal bronchogenic neoplasm in these areas. 4. Given above findings would suggest discussion in multi disciplinary thoracic oncologic setting with consideration for PET-CT or biopsy as warranted. 5. Small pericardial effusion similar to the prior study. 6. Post cholecystectomy.   Electronically Signed   By: Zetta Bills M.D.   On: 11/13/2019 16:27 I personally reviewed the CT images and concur with the findings noted above  Impression: Carlisle Torgeson is a 54 year old non-smoker who was found to have lung nodules about 10 years ago.  She has been followed off and on since that time.  She recently had a CT which showed significant increase in size of a basilar right lower lobe lung nodule.  Navigational bronchoscopy showed some foreign body giant cells and some atypical cells.  Had a long discussion with Mr. and Mrs. Yagi.  We reviewed the images.  We discussed the differential diagnosis which includes primary bronchogenic carcinoma as well as infectious or inflammatory lesions.  They understand that what ever the process is, it has been very slow moving.  I suspect this probably is a new lumbar disc disease, but cannot definitively rule out the possibility that the cancer.  We discussed options including continued radiographic follow-up and surgical resection.  Repeat bronchoscopy is also an option but I think it is likely to yield similar results.  The advantage to surgery is a definitive diagnosis more than 1 or the other and definitive treatment if it does not fact turn out to be cancerous.  The disadvantage is the need to undergo a surgical procedure.  My recommendation is we proceed with a robotic wedge resection of the right lower lobe nodule.  We would proceed with segmentectomy or lobectomy if the nodule  was in fact cancerous.  I described the procedure to Mrs. Cremer and her husband in detail.  They understand it would be done under general anesthesia, the incisions to be used, the use of drainage tube postoperatively, the expected hospital stay, and the overall recovery.  I informed him of the indications, risks, benefits, and alternatives.  They understand the risks include, but not limited to death, MI, DVT, PE, bleeding, possible need for transfusion, infection, air leaks, as well as possibility of other unforeseeable complications.  She would like to take some time and  discuss this further with her husband.  She will let us know how she would like to proceed.  Plan: Mrs. Dillard will call if she wishes to proceed with robotic right VATS for wedge resection and possible lobectomy.  Melrose Nakayama, MD [facility] [phone]

## 2020-01-14 ENCOUNTER — Other Ambulatory Visit (HOSPITAL_COMMUNITY): Payer: No Typology Code available for payment source

## 2020-01-16 ENCOUNTER — Encounter (HOSPITAL_COMMUNITY): Payer: No Typology Code available for payment source

## 2020-01-23 LAB — ACID FAST CULTURE WITH REFLEXED SENSITIVITIES (MYCOBACTERIA): Acid Fast Culture: NEGATIVE

## 2020-02-03 ENCOUNTER — Other Ambulatory Visit: Payer: No Typology Code available for payment source | Admitting: *Deleted

## 2020-02-03 ENCOUNTER — Other Ambulatory Visit: Payer: Self-pay | Admitting: *Deleted

## 2020-02-03 ENCOUNTER — Encounter: Payer: Self-pay | Admitting: *Deleted

## 2020-02-03 DIAGNOSIS — R918 Other nonspecific abnormal finding of lung field: Secondary | ICD-10-CM

## 2020-02-03 DIAGNOSIS — R911 Solitary pulmonary nodule: Secondary | ICD-10-CM

## 2020-02-03 DIAGNOSIS — Z01818 Encounter for other preprocedural examination: Secondary | ICD-10-CM

## 2020-02-11 ENCOUNTER — Other Ambulatory Visit: Payer: Self-pay

## 2020-02-11 DIAGNOSIS — N952 Postmenopausal atrophic vaginitis: Secondary | ICD-10-CM

## 2020-02-11 MED ORDER — ESTRADIOL 10 MCG VA TABS: ORAL_TABLET | VAGINAL | 0 refills | Status: DC

## 2020-02-11 NOTE — Telephone Encounter (Signed)
Had annual exam with Jarvis Newcomer, CNM 04/18/19. Scheduled with Dr. Talbert Nan for 02/22/20 for annual exam.

## 2020-03-02 ENCOUNTER — Other Ambulatory Visit: Payer: No Typology Code available for payment source

## 2020-03-03 ENCOUNTER — Ambulatory Visit
Admission: RE | Admit: 2020-03-03 | Discharge: 2020-03-03 | Disposition: A | Discharge status: Home / Self Care | Payer: Self-pay | Source: Ambulatory Visit | Attending: Thoracic Surgery (Cardiothoracic Vascular Surgery) | Admitting: Thoracic Surgery (Cardiothoracic Vascular Surgery)

## 2020-03-03 DIAGNOSIS — Z01818 Encounter for other preprocedural examination: Secondary | ICD-10-CM

## 2020-03-03 DIAGNOSIS — R918 Other nonspecific abnormal finding of lung field: Secondary | ICD-10-CM

## 2020-03-08 ENCOUNTER — Other Ambulatory Visit: Payer: Self-pay | Admitting: Obstetrics and Gynecology

## 2020-03-08 DIAGNOSIS — Z1231 Encounter for screening mammogram for malignant neoplasm of breast: Secondary | ICD-10-CM

## 2020-03-09 ENCOUNTER — Encounter (HOSPITAL_COMMUNITY): Payer: Self-pay

## 2020-03-09 ENCOUNTER — Encounter (HOSPITAL_COMMUNITY)
Admission: RE | Admit: 2020-03-09 | Discharge: 2020-03-09 | Disposition: A | Discharge status: Home / Self Care | Payer: No Typology Code available for payment source | Source: Ambulatory Visit | Attending: Thoracic Surgery (Cardiothoracic Vascular Surgery) | Admitting: Thoracic Surgery (Cardiothoracic Vascular Surgery)

## 2020-03-09 ENCOUNTER — Encounter: Payer: Self-pay | Admitting: Thoracic Surgery (Cardiothoracic Vascular Surgery)

## 2020-03-09 ENCOUNTER — Ambulatory Visit (HOSPITAL_COMMUNITY): Admission: RE | Admit: 2020-03-09 | Payer: No Typology Code available for payment source | Source: Ambulatory Visit

## 2020-03-09 ENCOUNTER — Other Ambulatory Visit (HOSPITAL_COMMUNITY): Payer: No Typology Code available for payment source

## 2020-03-09 ENCOUNTER — Other Ambulatory Visit: Payer: Self-pay

## 2020-03-09 ENCOUNTER — Ambulatory Visit: Payer: No Typology Code available for payment source | Admitting: Thoracic Surgery (Cardiothoracic Vascular Surgery)

## 2020-03-09 VITALS — BP 109/62 | HR 64 | Temp 98.5°F | Resp 18 | Ht 67.0 in | Wt 139.0 lb

## 2020-03-09 DIAGNOSIS — R911 Solitary pulmonary nodule: Secondary | ICD-10-CM | POA: Insufficient documentation

## 2020-03-09 DIAGNOSIS — Z01818 Encounter for other preprocedural examination: Secondary | ICD-10-CM | POA: Insufficient documentation

## 2020-03-09 LAB — BLOOD GAS, ARTERIAL
Acid-Base Excess: 2.3 mmol/L — ABNORMAL HIGH (ref 0.0–2.0)
Bicarbonate: 26.5 mmol/L (ref 20.0–28.0)
Drawn by: 602861
FIO2: 21
O2 Saturation: 98.4 %
Patient temperature: 37
pCO2 arterial: 42.2 mmHg (ref 32.0–48.0)
pH, Arterial: 7.414 (ref 7.350–7.450)
pO2, Arterial: 116 mmHg — ABNORMAL HIGH (ref 83.0–108.0)

## 2020-03-09 LAB — TYPE AND SCREEN
ABO/RH(D): A POS
Antibody Screen: NEGATIVE

## 2020-03-09 LAB — URINALYSIS, ROUTINE W REFLEX MICROSCOPIC
Bilirubin Urine: NEGATIVE
Glucose, UA: NEGATIVE mg/dL
Hgb urine dipstick: NEGATIVE
Ketones, ur: NEGATIVE mg/dL
Nitrite: NEGATIVE
Protein, ur: NEGATIVE mg/dL
Specific Gravity, Urine: 1.008 (ref 1.005–1.030)
pH: 7 (ref 5.0–8.0)

## 2020-03-09 LAB — COMPREHENSIVE METABOLIC PANEL
ALT: 20 U/L (ref 0–44)
AST: 31 U/L (ref 15–41)
Albumin: 4.1 g/dL (ref 3.5–5.0)
Alkaline Phosphatase: 45 U/L (ref 38–126)
Anion gap: 10 (ref 5–15)
BUN: 15 mg/dL (ref 6–20)
CO2: 24 mmol/L (ref 22–32)
Calcium: 9.1 mg/dL (ref 8.9–10.3)
Chloride: 104 mmol/L (ref 98–111)
Creatinine, Ser: 0.73 mg/dL (ref 0.44–1.00)
GFR, Estimated: >60 mL/min (ref >60)
Glucose, Bld: 91 mg/dL (ref 70–99)
Potassium: 4.5 mmol/L (ref 3.5–5.1)
Sodium: 138 mmol/L (ref 135–145)
Total Bilirubin: 0.9 mg/dL (ref 0.3–1.2)
Total Protein: 6.4 g/dL — ABNORMAL LOW (ref 6.5–8.1)

## 2020-03-09 LAB — CBC
HCT: 41.3 % (ref 36.0–46.0)
Hemoglobin: 13.7 g/dL (ref 12.0–15.0)
MCH: 29.3 pg (ref 26.0–34.0)
MCHC: 33.2 g/dL (ref 30.0–36.0)
MCV: 88.2 fL (ref 80.0–100.0)
Platelets: 164 10*3/uL (ref 150–400)
RBC: 4.68 MIL/uL (ref 3.87–5.11)
RDW: 13 % (ref 11.5–15.5)
WBC: 3.4 10*3/uL — ABNORMAL LOW (ref 4.0–10.5)
nRBC: 0 % (ref 0.0–0.2)

## 2020-03-09 LAB — SURGICAL PCR SCREEN
MRSA, PCR: NEGATIVE
Staphylococcus aureus: NEGATIVE

## 2020-03-09 LAB — APTT: aPTT: 28 seconds (ref 24–36)

## 2020-03-09 LAB — PROTIME-INR
INR: 1 (ref 0.8–1.2)
Prothrombin Time: 13.1 seconds (ref 11.4–15.2)

## 2020-03-09 NOTE — Progress Notes (Signed)
Surgical Instructions    Your procedure is scheduled on 03/11/20.  Report to [facility] Main Entrance "A" at 6:00 A.M., then check in with the Admitting office.  Call this number if you have problems the morning of surgery:  [phone]   If you have any questions prior to your surgery date call [phone]: Open Monday-Friday 8am-4pm    Remember:  Do not eat after midnight the night before your surgery   Take these medicines the morning of surgery with A SIP OF WATER:  None  As of today, STOP taking any Aspirin (unless otherwise instructed by your surgeon) Aleve, Naproxen, Ibuprofen, Motrin, Advil, Goody's, BC's, all herbal medications, fish oil, and all vitamins.                     Do not wear jewelry, make up, or nail polish            Do not wear lotions, powders, perfumes/colognes, or deodorant.            Do not shave 48 hours prior to surgery.  Men may shave face and neck.            Do not bring valuables to the hospital.            [facility] is not responsible for any belongings or valuables.  Do NOT Smoke (Tobacco/Vaping) or drink Alcohol 24 hours prior to your procedure If you use a CPAP at night, you may bring all equipment for your overnight stay.   Contacts, glasses, dentures or bridgework may not be worn into surgery, please bring cases for these belongings   For patients admitted to the hospital, discharge time will be determined by your treatment team.   Patients discharged the day of surgery will not be allowed to drive home, and someone needs to stay with them for 24 hours.    Special instructions:   [facility]- Preparing For Surgery  Before surgery, you can play an important role. Because skin is not sterile, your skin needs to be as free of germs as possible. You can reduce the number of germs on your skin by washing with CHG (chlorahexidine gluconate) Soap before surgery.  CHG is an antiseptic cleaner which kills germs and bonds with the skin to  continue killing germs even after washing.    Oral Hygiene is also important to reduce your risk of infection.  Remember - BRUSH YOUR TEETH THE MORNING OF SURGERY WITH YOUR REGULAR TOOTHPASTE  Please do not use if you have an allergy to CHG or antibacterial soaps. If your skin becomes reddened/irritated stop using the CHG.  Do not shave (including legs and underarms) for at least 48 hours prior to first CHG shower. It is OK to shave your face.  Please follow these instructions carefully.   1. Shower the NIGHT BEFORE SURGERY and the MORNING OF SURGERY  2. If you chose to wash your hair, wash your hair first as usual with your normal shampoo.  3. After you shampoo, rinse your hair and body thoroughly to remove the shampoo.  4. Wash Face and genitals (private parts) with your normal soap.   5.  Shower the NIGHT BEFORE SURGERY and the MORNING OF SURGERY with CHG Soap.   6. Use CHG Soap as you would any other liquid soap. You can apply CHG directly to the skin and wash gently with a scrungie or a clean washcloth.   7. Apply the CHG Soap to  your body ONLY FROM THE NECK DOWN.  Do not use on open wounds or open sores. Avoid contact with your eyes, ears, mouth and genitals (private parts). Wash Face and genitals (private parts)  with your normal soap.   8. Wash thoroughly, paying special attention to the area where your surgery will be performed.  9. Thoroughly rinse your body with warm water from the neck down.  10. DO NOT shower/wash with your normal soap after using and rinsing off the CHG Soap.  11. Pat yourself dry with a CLEAN TOWEL.  12. Wear CLEAN PAJAMAS to bed the night before surgery  13. Place CLEAN SHEETS on your bed the night before your surgery  14. DO NOT SLEEP WITH PETS.   Day of Surgery: Wear Clean/Comfortable clothing the morning of surgery Do not apply any deodorants/lotions.   Remember to brush your teeth WITH YOUR REGULAR TOOTHPASTE.   Please read over the  following fact sheets that you were given.

## 2020-03-09 NOTE — Progress Notes (Signed)
PCP:   Howard Pouch, DO Cardiologist:  Denies  EKG:  03/09/20 CXR:  03/09/20 ECHO:  denies Stress Test: denies Cardiac Cath: denies  Fasting Blood Sugar- n/a Checks Blood Sugar_n/a__ times a day  OSA/CPAP:  No  ASA/Blood Thinners:  No  Covid test positive 02/12/20.  No need to retest.   Anesthesia Review:  No  Patient denies shortness of breath, fever, cough, and chest pain at PAT appointment.  Patient verbalized understanding of instructions provided today at the PAT appointment.  Patient asked to review instructions at home and day of surgery.

## 2020-03-09 NOTE — Progress Notes (Signed)
Patient ID: Laura Ritter, female   DOB: Sep 15, 1965, 55 y.o.   MRN: [id number]   Mrs. Wailes returns to discuss surgery scheduled for 03/11/2020.  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 She was found to have a lung nodule initially back in 2010 on a CT that was done for kidney stones. She has had waxing and waning nodules over that time. In 2017 she had a PET/CT which showed some mild metabolic activity. All of her work-up including a navigational bronchoscopy has been nondiagnostic. She is a lifelong non-smoker.  I saw her in the office on 01/12/2020. We discussed her options and I offered her the option of a robotic wedge resection of the right lower lobe nodule. That is the dominant nodule. If that were to turn out to be a non-small cell carcinoma of the lung and we would proceed with a lobectomy at the same setting.  After initial discussion she and her husband wanted some time to think things over. She then call to schedule the procedure. Had to be rescheduled because she had COVID-19. She only had mild symptoms with that and has completely recovered.  We again reviewed the images and discussed the options of radiographic follow-up versus surgical resection for definitive diagnosis and treatment. She does not wish to proceed with surgery. She understands the indications, risks, benefits, and alternatives. The risks were outlined in detail in my note from 01/12/2020.  Plan to proceed with robotic right VATS for wedge resection and possible lobectomy on Thursday, 03/11/2020.  I spent 9 minutes in review of images, records, and in consultation with Mrs. Botto today. Revonda Standard Roxan Hockey, MD [facility] [phone]

## 2020-03-09 NOTE — H&P (View-Only) (Signed)
Patient ID: Laura Ritter, female   DOB: May 24, 1965, 55 y.o.   MRN: [id number]   Mrs. Demars returns to discuss surgery scheduled for 03/11/2020.  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 She was found to have a lung nodule initially back in 2010 on a CT that was done for kidney stones. She has had waxing and waning nodules over that time. In 2017 she had a PET/CT which showed some mild metabolic activity. All of her work-up including a navigational bronchoscopy has been nondiagnostic. She is a lifelong non-smoker.  I saw her in the office on 01/12/2020. We discussed her options and I offered her the option of a robotic wedge resection of the right lower lobe nodule. That is the dominant nodule. If that were to turn out to be a non-small cell carcinoma of the lung and we would proceed with a lobectomy at the same setting.  After initial discussion she and her husband wanted some time to think things over. She then call to schedule the procedure. Had to be rescheduled because she had COVID-19. She only had mild symptoms with that and has completely recovered.  We again reviewed the images and discussed the options of radiographic follow-up versus surgical resection for definitive diagnosis and treatment. She does not wish to proceed with surgery. She understands the indications, risks, benefits, and alternatives. The risks were outlined in detail in my note from 01/12/2020.  Plan to proceed with robotic right VATS for wedge resection and possible lobectomy on Thursday, 03/11/2020.  I spent 9 minutes in review of images, records, and in consultation with Mrs. Kent today. Revonda Standard Roxan Hockey, MD [facility] [phone]

## 2020-03-10 ENCOUNTER — Ambulatory Visit (HOSPITAL_COMMUNITY)
Admission: RE | Admit: 2020-03-10 | Discharge: 2020-03-10 | Disposition: A | Discharge status: Home / Self Care | Payer: No Typology Code available for payment source | Source: Ambulatory Visit | Attending: Thoracic Surgery (Cardiothoracic Vascular Surgery) | Admitting: Thoracic Surgery (Cardiothoracic Vascular Surgery)

## 2020-03-10 DIAGNOSIS — R911 Solitary pulmonary nodule: Secondary | ICD-10-CM | POA: Insufficient documentation

## 2020-03-10 LAB — PULMONARY FUNCTION TEST
DL/VA % pred: 118 %
DL/VA: 4.96 ml/min/mmHg/L
DLCO cor % pred: 115 %
DLCO cor: 26.45 ml/min/mmHg
DLCO unc % pred: 116 %
DLCO unc: 26.69 ml/min/mmHg
FEF 25-75 Pre: 2.05 L/sec
FEF2575-%Pred-Pre: 73 %
FEV1-%Pred-Pre: 88 %
FEV1-Pre: 2.66 L
FEV1FVC-%Pred-Pre: 93 %
FEV6-%Pred-Pre: 94 %
FEV6-Pre: 3.53 L
FEV6FVC-%Pred-Pre: 102 %
FVC-%Pred-Pre: 93 %
FVC-Pre: 3.58 L
Pre FEV1/FVC ratio: 74 %
Pre FEV6/FVC Ratio: 100 %
RV % pred: 108 %
RV: 2.18 L
TLC % pred: 108 %
TLC: 5.96 L

## 2020-03-10 MED ORDER — ALBUTEROL SULFATE (2.5 MG/3ML) 0.083% IN NEBU: 2.5000 mg | INHALATION_SOLUTION | Freq: Once | RESPIRATORY_TRACT | Status: DC

## 2020-03-10 NOTE — Anesthesia Preprocedure Evaluation (Addendum)
Anesthesia Evaluation  Patient identified by MRN, date of birth, ID band Patient awake    Reviewed: Allergy & Precautions, H&P , NPO status , Patient's Chart, lab work & pertinent test results  Airway Mallampati: II   Neck ROM: full    Dental   Pulmonary  RLL nodule   breath sounds clear to auscultation       Cardiovascular negative cardio ROS   Rhythm:regular Rate:Normal     Neuro/Psych    GI/Hepatic   Endo/Other    Renal/GU stones     Musculoskeletal  (+) Arthritis ,   Abdominal   Peds  Hematology   Anesthesia Other Findings   Reproductive/Obstetrics                             Anesthesia Physical Anesthesia Plan  ASA: II  Anesthesia Plan: General   Post-op Pain Management:    Induction: Intravenous  PONV Risk Score and Plan: 3 and Ondansetron, Dexamethasone, Midazolam and Treatment may vary due to age or medical condition  Airway Management Planned: Double Lumen EBT  Additional Equipment: Arterial line, CVP and Ultrasound Guidance Line Placement  Intra-op Plan:   Post-operative Plan: Extubation in OR  Informed Consent: I have reviewed the patients History and Physical, chart, labs and discussed the procedure including the risks, benefits and alternatives for the proposed anesthesia with the patient or authorized representative who has indicated his/her understanding and acceptance.     Dental advisory given  Plan Discussed with: CRNA, Anesthesiologist and Surgeon  Anesthesia Plan Comments: (PAT note written 03/10/2020 by Myra Gianotti, PA-C. )       Anesthesia Quick Evaluation

## 2020-03-10 NOTE — Progress Notes (Signed)
Anesthesia Chart Review:  Case: [id number] Date/Time: 03/11/20 0745   Procedures:      XI ROBOTIC ASSISTED THORASCOPY-RIGHT LOWER LOBE WEDGE RESECTION (Right Chest)     possible XI ROBOTIC ASSISTED THORASCOPY-LOBECTOMY (Right Chest)   Anesthesia type: General   Pre-op diagnosis: RIGHT LUNG NODULE   Location: [facility] OR ROOM 10 / [facility]   Surgeons: Melrose Nakayama, MD      DISCUSSION: Patient is a 55 year old female scheduled for the above procedure. Apparently case was previously scheduled for last month, but she had COVID 02/12/20 (copy of result on chart), so surgery rescheduled.    History includes never smoker, leukopenia, PVCs, anxiety, nephrolithiasis, back surgery (2016), cholecystectomy (2001). She has had findings of pulmonary nodules on CT imaging dating back to at least 2011 which have waxed and waned over time. S/p flexible video fiberoptic bronchoscopy with ENB 11/28/19 (pathology: RLL/LUL brushings: No malignant cells identified; RLL FNA: rare atypical cells, occasional foreign body giant cells). +COVID-19 02/12/20.  She is a physical therapist.   Anesthesia team to evaluate on the day of surgery.   VS: BP 114/74   Pulse 64   Temp (!) 36.4 C (Oral)   Resp 17   Ht 5\' 7"  (1.702 m)   Wt 63.4 kg   LMP 01/12/2014 (LMP Unknown)   SpO2 100%   BMI 21.90 kg/m    PROVIDERS: Raoul Pitch, Renee A, DO is PCP  Marshell Garfinkel, MD is pulmonologist Irine Seal, MD is urologist She is not followed routinely by cardiology, but saw Skeet Latch, MD in 2017 for palpitations, rare PVCs.    LABS: Labs reviewed: Acceptable for surgery. Labs marked as reviewed by Dr. Roxan Hockey.  (all labs ordered are listed, but only abnormal results are displayed)  Labs Reviewed  BLOOD GAS, ARTERIAL - Abnormal; Notable for the following components:      Result Value   pO2, Arterial 116 (*)    Acid-Base Excess 2.3 (*)    Allens test (pass/fail) BRACHIAL ARTERY (*)    All other components within  normal limits  CBC - Abnormal; Notable for the following components:   WBC 3.4 (*)    All other components within normal limits  COMPREHENSIVE METABOLIC PANEL - Abnormal; Notable for the following components:   Total Protein 6.4 (*)    All other components within normal limits  URINALYSIS, ROUTINE W REFLEX MICROSCOPIC - Abnormal; Notable for the following components:   APPearance HAZY (*)    Leukocytes,Ua TRACE (*)    Bacteria, UA MANY (*)    All other components within normal limits  SURGICAL PCR SCREEN  APTT  PROTIME-INR  TYPE AND SCREEN     IMAGES: CXR 03/09/20: FINDINGS: The heart size and mediastinal contours are within normal limits. A clip is again seen in the right lower lobe. Previously seen airspace disease in this region has now resolved. Both lungs are clear. The visualized skeletal structures are unremarkable. IMPRESSION: No active cardiopulmonary disease.  CT Chest 03/03/20: IMPRESSION: 1. Several (at least 7) pulmonary nodules scattered in the lungs bilaterally, all stable since 11/13/2019 chest CT. The dominant irregular nodule measuring 1.7 cm in the right lower lobe has mildly increased compared to more remote chest CT studies, as previously detailed. Slow growing primary bronchogenic malignancy cannot be excluded. Continued chest CT surveillance of these nodules is suggested. 2. No thoracic adenopathy.   EKG: 03/09/20:  Normal sinus rhythm with sinus arrhythmia No significant change since last tracing Confirmed by Minus Breeding (  [id number]) on 03/09/2020 8:57:51 PM   CV: Echo 03/10/15: Study Conclusions  - Left ventricle: The cavity size was mildly dilated. Wall  thickness was normal. Systolic function was normal. The estimated  ejection fraction was in the range of 50% to 55%.  - Atrial septum: No defect or patent foramen ovale was identified.  - Pericardium, extracardiac: Trivial pericardial effusion   Past Medical History:  Diagnosis Date  .  History of kidney stones 05/2007   followed yearly follow up- Dr. Jeffie Pollock  . Leukopenia   . PVC (premature ventricular contraction) 04/01/2015   anxiety related  . Screening for HIV (human immunodeficiency virus)     Past Surgical History:  Procedure Laterality Date  . BACK SURGERY  1/16   herniated disc  . BRONCHIAL BIOPSY  11/28/2019   Procedure: BRONCHIAL BIOPSIES;  Surgeon: Garner Nash, DO;  Location: [facility];  Service: Pulmonary;;  . BRONCHIAL BRUSHINGS  11/28/2019   Procedure: BRONCHIAL BRUSHINGS;  Surgeon: Garner Nash, DO;  Location: [facility];  Service: Pulmonary;;  . BRONCHIAL NEEDLE ASPIRATION BIOPSY  11/28/2019   Procedure: BRONCHIAL NEEDLE ASPIRATION BIOPSIES;  Surgeon: Garner Nash, DO;  Location: [facility];  Service: Pulmonary;;  . BRONCHIAL WASHINGS  11/28/2019   Procedure: BRONCHIAL WASHINGS;  Surgeon: Garner Nash, DO;  Location: [employer];  Service: Pulmonary;;  . CESAREAN SECTION     x3  . CHOLECYSTECTOMY  2001  . COLONOSCOPY    . EXTRACORPOREAL SHOCK WAVE LITHOTRIPSY Right 04/19/2017   Procedure: RIGHT EXTRACORPOREAL SHOCK WAVE LITHOTRIPSY (ESWL);  Surgeon: Ceasar Mons, MD;  Location: [facility];  Service: Urology;  Laterality: Right;  . FIDUCIAL MARKER PLACEMENT  11/28/2019   Procedure: FIDUCIAL MARKER PLACEMENT;  Surgeon: Garner Nash, DO;  Location: [facility];  Service: Pulmonary;;  . LAPAROSCOPIC CHOLECYSTECTOMY  2001  . REFRACTIVE SURGERY Bilateral 2006   lasik  . SPINE SURGERY    . VIDEO BRONCHOSCOPY WITH ENDOBRONCHIAL NAVIGATION N/A 11/28/2019   Procedure: VIDEO BRONCHOSCOPY WITH ENDOBRONCHIAL NAVIGATION;  Surgeon: Garner Nash, DO;  Location: [facility];  Service: Pulmonary;  Laterality: N/A;  . WISDOM TOOTH EXTRACTION      MEDICATIONS: . Calcium Carbonate-Vitamin D 600-400 MG-UNIT tablet  . Cholecalciferol (VITAMIN D-3) 25 MCG (1000 UT) CAPS  . Estradiol 10 MCG TABS vaginal tablet  . Multiple  Vitamins-Minerals (MULTIVITAMIN PO)  . Omega-3 Fatty Acids (FISH OIL PO)   No current facility-administered medications for this encounter.   Marland Kitchen albuterol (PROVENTIL) (2.5 MG/3ML) 0.083% nebulizer solution 2.5 mg    Myra Gianotti, PA-C Surgical Short Stay/Anesthesiology [facility] Phone [phone] [facility] Phone [phone] 03/10/2020 10:15 AM

## 2020-03-11 ENCOUNTER — Inpatient Hospital Stay (HOSPITAL_COMMUNITY): Payer: No Typology Code available for payment source | Admitting: Vascular Surgery

## 2020-03-11 ENCOUNTER — Inpatient Hospital Stay (HOSPITAL_COMMUNITY): Payer: No Typology Code available for payment source

## 2020-03-11 ENCOUNTER — Inpatient Hospital Stay (HOSPITAL_COMMUNITY)
Admission: RE | Admit: 2020-03-11 | Discharge: 2020-03-12 | DRG: 165 | Disposition: A | Discharge status: Home / Self Care | Payer: No Typology Code available for payment source | Attending: Thoracic Surgery (Cardiothoracic Vascular Surgery) | Admitting: Thoracic Surgery (Cardiothoracic Vascular Surgery)

## 2020-03-11 ENCOUNTER — Encounter (HOSPITAL_COMMUNITY): Payer: Self-pay | Admitting: Thoracic Surgery (Cardiothoracic Vascular Surgery)

## 2020-03-11 ENCOUNTER — Inpatient Hospital Stay (HOSPITAL_COMMUNITY): Payer: No Typology Code available for payment source | Admitting: Certified Registered"

## 2020-03-11 ENCOUNTER — Encounter (HOSPITAL_COMMUNITY)
Admission: RE | Disposition: A | Discharge status: Home / Self Care | Payer: Self-pay | Source: Home / Self Care | Attending: Thoracic Surgery (Cardiothoracic Vascular Surgery)

## 2020-03-11 ENCOUNTER — Other Ambulatory Visit: Payer: Self-pay

## 2020-03-11 DIAGNOSIS — Z09 Encounter for follow-up examination after completed treatment for conditions other than malignant neoplasm: Secondary | ICD-10-CM

## 2020-03-11 DIAGNOSIS — R911 Solitary pulmonary nodule: Secondary | ICD-10-CM

## 2020-03-11 DIAGNOSIS — Z87442 Personal history of urinary calculi: Secondary | ICD-10-CM | POA: Diagnosis not present

## 2020-03-11 DIAGNOSIS — Z902 Acquired absence of lung [part of]: Secondary | ICD-10-CM

## 2020-03-11 DIAGNOSIS — Z6710 Type A blood, Rh positive: Secondary | ICD-10-CM

## 2020-03-11 DIAGNOSIS — Z8616 Personal history of COVID-19: Secondary | ICD-10-CM | POA: Diagnosis not present

## 2020-03-11 DIAGNOSIS — R918 Other nonspecific abnormal finding of lung field: Secondary | ICD-10-CM | POA: Diagnosis present

## 2020-03-11 HISTORY — DX: Type A blood, Rh positive: Z67.10

## 2020-03-11 HISTORY — PX: LYMPH NODE DISSECTION: SHX5087

## 2020-03-11 HISTORY — PX: OTHER SURGICAL HISTORY: SHX169

## 2020-03-11 HISTORY — PX: INTERCOSTAL NERVE BLOCK: SHX5021

## 2020-03-11 LAB — ABO/RH: ABO/RH(D): A POS

## 2020-03-11 SURGERY — WEDGE RESECTION, LUNG, ROBOT-ASSISTED, THORACOSCOPIC
Anesthesia: General | Site: Chest | Laterality: Right

## 2020-03-11 MED ORDER — ONDANSETRON HCL 4 MG/2ML IJ SOLN: 4.0000 mg | Freq: Four times a day (QID) | INTRAMUSCULAR | Status: DC | PRN

## 2020-03-11 MED ORDER — PROPOFOL 10 MG/ML IV BOLUS
INTRAVENOUS | Status: DC | PRN
  Administered 2020-03-11: 110 mg via INTRAVENOUS

## 2020-03-11 MED ORDER — FENTANYL CITRATE (PF) 100 MCG/2ML IJ SOLN
25.0000 ug | INTRAMUSCULAR | Status: DC | PRN
  Administered 2020-03-11 (×3): 50 ug via INTRAVENOUS

## 2020-03-11 MED ORDER — MIDAZOLAM HCL 2 MG/2ML IJ SOLN
INTRAMUSCULAR | Status: AC
  Filled 2020-03-11: qty 2

## 2020-03-11 MED ORDER — EPHEDRINE SULFATE 50 MG/ML IJ SOLN
INTRAMUSCULAR | Status: DC | PRN
  Administered 2020-03-11 (×5): 5 mg via INTRAVENOUS

## 2020-03-11 MED ORDER — LIDOCAINE 2% (20 MG/ML) 5 ML SYRINGE
INTRAMUSCULAR | Status: DC | PRN
  Administered 2020-03-11: 60 mg via INTRAVENOUS

## 2020-03-11 MED ORDER — CHLORHEXIDINE GLUCONATE 0.12 % MT SOLN
15.0000 mL | Freq: Once | OROMUCOSAL | Status: AC
  Administered 2020-03-11: 15 mL via OROMUCOSAL
  Filled 2020-03-11: qty 15

## 2020-03-11 MED ORDER — BUPIVACAINE HCL (PF) 0.5 % IJ SOLN
INTRAMUSCULAR | Status: AC
  Filled 2020-03-11: qty 30

## 2020-03-11 MED ORDER — FENTANYL CITRATE (PF) 250 MCG/5ML IJ SOLN
INTRAMUSCULAR | Status: DC | PRN
  Administered 2020-03-11 (×4): 50 ug via INTRAVENOUS

## 2020-03-11 MED ORDER — CEFAZOLIN SODIUM-DEXTROSE 2-4 GM/100ML-% IV SOLN
2.0000 g | INTRAVENOUS | Status: AC
  Administered 2020-03-11: 2 g via INTRAVENOUS
  Filled 2020-03-11: qty 100

## 2020-03-11 MED ORDER — FENTANYL CITRATE (PF) 100 MCG/2ML IJ SOLN
INTRAMUSCULAR | Status: AC
  Filled 2020-03-11: qty 2

## 2020-03-11 MED ORDER — SODIUM CHLORIDE FLUSH 0.9 % IV SOLN
INTRAVENOUS | Status: DC | PRN
  Administered 2020-03-11: 90 mL

## 2020-03-11 MED ORDER — ACETAMINOPHEN 160 MG/5ML PO SOLN: 1000.0000 mg | Freq: Four times a day (QID) | ORAL | Status: DC

## 2020-03-11 MED ORDER — MIDAZOLAM HCL 5 MG/5ML IJ SOLN
INTRAMUSCULAR | Status: DC | PRN
  Administered 2020-03-11: 2 mg via INTRAVENOUS

## 2020-03-11 MED ORDER — LACTATED RINGERS IV SOLN: INTRAVENOUS | Status: DC

## 2020-03-11 MED ORDER — DEXAMETHASONE SODIUM PHOSPHATE 10 MG/ML IJ SOLN
INTRAMUSCULAR | Status: DC | PRN
  Administered 2020-03-11: 10 mg via INTRAVENOUS

## 2020-03-11 MED ORDER — PANTOPRAZOLE SODIUM 40 MG PO TBEC
40.0000 mg | DELAYED_RELEASE_TABLET | Freq: Every day | ORAL | Status: DC
  Administered 2020-03-11: 40 mg via ORAL
  Filled 2020-03-11 (×2): qty 1

## 2020-03-11 MED ORDER — TRAMADOL HCL 50 MG PO TABS
50.0000 mg | ORAL_TABLET | Freq: Four times a day (QID) | ORAL | Status: DC | PRN
  Administered 2020-03-11 (×2): 100 mg via ORAL
  Filled 2020-03-11 (×2): qty 2

## 2020-03-11 MED ORDER — ACETAMINOPHEN 500 MG PO TABS
1000.0000 mg | ORAL_TABLET | Freq: Four times a day (QID) | ORAL | Status: DC
  Administered 2020-03-11 – 2020-03-12 (×4): 1000 mg via ORAL
  Filled 2020-03-11 (×4): qty 2

## 2020-03-11 MED ORDER — ORAL CARE MOUTH RINSE: 15.0000 mL | Freq: Once | OROMUCOSAL | Status: AC

## 2020-03-11 MED ORDER — ROCURONIUM BROMIDE 10 MG/ML (PF) SYRINGE
PREFILLED_SYRINGE | INTRAVENOUS | Status: DC | PRN
  Administered 2020-03-11: 20 mg via INTRAVENOUS
  Administered 2020-03-11: 50 mg via INTRAVENOUS
  Administered 2020-03-11: 20 mg via INTRAVENOUS

## 2020-03-11 MED ORDER — CEFAZOLIN SODIUM-DEXTROSE 2-4 GM/100ML-% IV SOLN
2.0000 g | Freq: Three times a day (TID) | INTRAVENOUS | Status: AC
  Administered 2020-03-11 – 2020-03-12 (×2): 2 g via INTRAVENOUS
  Filled 2020-03-11 (×2): qty 100

## 2020-03-11 MED ORDER — KETOROLAC TROMETHAMINE 15 MG/ML IJ SOLN
15.0000 mg | Freq: Four times a day (QID) | INTRAMUSCULAR | Status: DC
  Administered 2020-03-11 – 2020-03-12 (×3): 15 mg via INTRAVENOUS
  Filled 2020-03-11 (×3): qty 1

## 2020-03-11 MED ORDER — SENNOSIDES-DOCUSATE SODIUM 8.6-50 MG PO TABS
1.0000 | ORAL_TABLET | Freq: Every day | ORAL | Status: DC
  Administered 2020-03-11: 1 via ORAL
  Filled 2020-03-11: qty 1

## 2020-03-11 MED ORDER — ENOXAPARIN SODIUM 40 MG/0.4ML ~~LOC~~ SOLN
40.0000 mg | SUBCUTANEOUS | Status: DC
  Administered 2020-03-11: 40 mg via SUBCUTANEOUS
  Filled 2020-03-11: qty 0.4

## 2020-03-11 MED ORDER — BISACODYL 5 MG PO TBEC
10.0000 mg | DELAYED_RELEASE_TABLET | Freq: Every day | ORAL | Status: DC
  Administered 2020-03-11 – 2020-03-12 (×2): 10 mg via ORAL
  Filled 2020-03-11 (×2): qty 2

## 2020-03-11 MED ORDER — DEXTROSE-NACL 5-0.9 % IV SOLN: INTRAVENOUS | Status: DC

## 2020-03-11 MED ORDER — SUGAMMADEX SODIUM 200 MG/2ML IV SOLN
INTRAVENOUS | Status: DC | PRN
  Administered 2020-03-11: 120 mg via INTRAVENOUS

## 2020-03-11 MED ORDER — FENTANYL CITRATE (PF) 250 MCG/5ML IJ SOLN
INTRAMUSCULAR | Status: AC
  Filled 2020-03-11: qty 5

## 2020-03-11 MED ORDER — PHENYLEPHRINE HCL-NACL 10-0.9 MG/250ML-% IV SOLN
INTRAVENOUS | Status: DC | PRN
  Administered 2020-03-11: 30 ug/min via INTRAVENOUS

## 2020-03-11 MED ORDER — PROPOFOL 10 MG/ML IV BOLUS
INTRAVENOUS | Status: AC
  Filled 2020-03-11: qty 40

## 2020-03-11 MED ORDER — CHLORHEXIDINE GLUCONATE CLOTH 2 % EX PADS: 6.0000 | MEDICATED_PAD | Freq: Every day | CUTANEOUS | Status: DC

## 2020-03-11 MED ORDER — ONDANSETRON HCL 4 MG/2ML IJ SOLN
INTRAMUSCULAR | Status: DC | PRN
  Administered 2020-03-11: 4 mg via INTRAVENOUS

## 2020-03-11 MED ORDER — OXYCODONE HCL 5 MG PO TABS: 5.0000 mg | ORAL_TABLET | Freq: Once | ORAL | Status: DC | PRN

## 2020-03-11 MED ORDER — OXYCODONE HCL 5 MG/5ML PO SOLN: 5.0000 mg | Freq: Once | ORAL | Status: DC | PRN

## 2020-03-11 MED ORDER — HYDROMORPHONE HCL 1 MG/ML IJ SOLN
INTRAMUSCULAR | Status: AC
  Filled 2020-03-11: qty 1

## 2020-03-11 MED ORDER — LACTATED RINGERS IV SOLN: INTRAVENOUS | Status: DC | PRN

## 2020-03-11 MED ORDER — BUPIVACAINE LIPOSOME 1.3 % IJ SUSP
20.0000 mL | INTRAMUSCULAR | Status: DC
  Filled 2020-03-11: qty 20

## 2020-03-11 MED ORDER — 0.9 % SODIUM CHLORIDE (POUR BTL) OPTIME
TOPICAL | Status: DC | PRN
  Administered 2020-03-11: 2000 mL

## 2020-03-11 MED ORDER — HYDROMORPHONE HCL 1 MG/ML IJ SOLN
0.2500 mg | INTRAMUSCULAR | Status: DC | PRN
  Administered 2020-03-11 (×2): 0.5 mg via INTRAVENOUS

## 2020-03-11 SURGICAL SUPPLY — 123 items
ADH SKN CLS APL DERMABOND .7 (GAUZE/BANDAGES/DRESSINGS) ×2
APPLIER CLIP ROT 10 11.4 M/L (STAPLE)
APR CLP MED LRG 11.4X10 (STAPLE)
BAG SPEC RTRVL C125 8X14 (MISCELLANEOUS) ×2
BLADE CLIPPER SURG (BLADE) ×2 IMPLANT
BNDG COHESIVE 6X5 TAN STRL LF (GAUZE/BANDAGES/DRESSINGS) ×3 IMPLANT
CANISTER SUCT 3000ML PPV (MISCELLANEOUS) ×6 IMPLANT
CANNULA REDUC XI 12-8 STAPL (CANNULA) ×6
CANNULA REDUCER 12-8 DVNC XI (CANNULA) ×4 IMPLANT
CATH THORACIC 28FR (CATHETERS) IMPLANT
CATH THORACIC 28FR RT ANG (CATHETERS) IMPLANT
CATH THORACIC 36FR (CATHETERS) IMPLANT
CATH THORACIC 36FR RT ANG (CATHETERS) IMPLANT
CLIP APPLIE ROT 10 11.4 M/L (STAPLE) IMPLANT
CLIP VESOCCLUDE MED 6/CT (CLIP) IMPLANT
CNTNR URN SCR LID CUP LEK RST (MISCELLANEOUS) ×10 IMPLANT
CONN ST 1/4X3/8  BEN (MISCELLANEOUS)
CONN ST 1/4X3/8 BEN (MISCELLANEOUS) IMPLANT
CONN Y 3/8X3/8X3/8  BEN (MISCELLANEOUS)
CONN Y 3/8X3/8X3/8 BEN (MISCELLANEOUS) IMPLANT
CONT SPEC 4OZ STRL OR WHT (MISCELLANEOUS) ×15
DEFOGGER SCOPE WARMER CLEARIFY (MISCELLANEOUS) ×3 IMPLANT
DERMABOND ADVANCED (GAUZE/BANDAGES/DRESSINGS) ×1
DERMABOND ADVANCED .7 DNX12 (GAUZE/BANDAGES/DRESSINGS) ×2 IMPLANT
DRAIN CHANNEL 28F RND 3/8 FF (WOUND CARE) ×1 IMPLANT
DRAIN CHANNEL 32F RND 10.7 FF (WOUND CARE) IMPLANT
DRAPE ARM DVNC X/XI (DISPOSABLE) ×8 IMPLANT
DRAPE COLUMN DVNC XI (DISPOSABLE) ×2 IMPLANT
DRAPE CV SPLIT W-CLR ANES SCRN (DRAPES) ×3 IMPLANT
DRAPE DA VINCI XI ARM (DISPOSABLE) ×12
DRAPE DA VINCI XI COLUMN (DISPOSABLE) ×3
DRAPE INCISE IOBAN 66X45 STRL (DRAPES) IMPLANT
DRAPE ORTHO SPLIT 77X108 STRL (DRAPES) ×3
DRAPE SURG ORHT 6 SPLT 77X108 (DRAPES) ×2 IMPLANT
DRAPE WARM FLUID 44X44 (DRAPES) ×2 IMPLANT
ELECT BLADE 6.5 EXT (BLADE) ×3 IMPLANT
ELECT REM PT RETURN 9FT ADLT (ELECTROSURGICAL) ×3
ELECTRODE REM PT RTRN 9FT ADLT (ELECTROSURGICAL) ×2 IMPLANT
GAUZE KITTNER 4X5 RF (MISCELLANEOUS) ×6 IMPLANT
GAUZE SPONGE 4X4 12PLY STRL (GAUZE/BANDAGES/DRESSINGS) ×3 IMPLANT
GLOVE SURG SS PI 8.0 STRL IVOR (GLOVE) ×3 IMPLANT
GLOVE SURG SYN 7.5  E (GLOVE)
GLOVE SURG SYN 7.5 E (GLOVE) IMPLANT
GLOVE SURG SYN 7.5 PF PI (GLOVE) IMPLANT
GLOVE TRIUMPH SURG SIZE 7.5 (KITS) ×6 IMPLANT
GOWN STRL REUS W/ TWL LRG LVL3 (GOWN DISPOSABLE) ×4 IMPLANT
GOWN STRL REUS W/ TWL XL LVL3 (GOWN DISPOSABLE) ×6 IMPLANT
GOWN STRL REUS W/TWL 2XL LVL3 (GOWN DISPOSABLE) ×6 IMPLANT
GOWN STRL REUS W/TWL LRG LVL3 (GOWN DISPOSABLE) ×6
GOWN STRL REUS W/TWL XL LVL3 (GOWN DISPOSABLE) ×9
HEMOSTAT SURGICEL 2X14 (HEMOSTASIS) ×7 IMPLANT
IRRIGATION STRYKERFLOW (MISCELLANEOUS) ×2 IMPLANT
IRRIGATOR STRYKERFLOW (MISCELLANEOUS) ×3
KIT BASIN OR (CUSTOM PROCEDURE TRAY) ×6 IMPLANT
KIT SUCTION CATH 14FR (SUCTIONS) IMPLANT
KIT TURNOVER KIT B (KITS) ×3 IMPLANT
LOOP VESSEL SUPERMAXI WHITE (MISCELLANEOUS) IMPLANT
NDL HYPO 25GX1X1/2 BEV (NEEDLE) ×2 IMPLANT
NDL SPNL 22GX3.5 QUINCKE BK (NEEDLE) ×2 IMPLANT
NEEDLE HYPO 25GX1X1/2 BEV (NEEDLE) ×3 IMPLANT
NEEDLE SPNL 22GX3.5 QUINCKE BK (NEEDLE) ×3 IMPLANT
NS IRRIG 1000ML POUR BTL (IV SOLUTION) ×6 IMPLANT
OBTURATOR OPTICAL STANDARD 8MM (TROCAR)
OBTURATOR OPTICAL STND 8 DVNC (TROCAR)
OBTURATOR OPTICALSTD 8 DVNC (TROCAR) IMPLANT
PACK CHEST (CUSTOM PROCEDURE TRAY) ×3 IMPLANT
PAD ARMBOARD 7.5X6 YLW CONV (MISCELLANEOUS) ×6 IMPLANT
PORT ACCESS TROCAR AIRSEAL 12 (TROCAR) ×2 IMPLANT
PORT ACCESS TROCAR AIRSEAL 5M (TROCAR) ×1
RELOAD STAPLE 45 3.5 BLU DVNC (STAPLE) IMPLANT
RELOAD STAPLE 45 4.3 GRN DVNC (STAPLE) IMPLANT
RELOAD STAPLER 3.5X45 BLU DVNC (STAPLE) ×8 IMPLANT
RELOAD STAPLER 4.3X45 GRN DVNC (STAPLE) ×6 IMPLANT
SCISSORS LAP 5X35 DISP (ENDOMECHANICALS) IMPLANT
SEAL CANN UNIV 5-8 DVNC XI (MISCELLANEOUS) ×4 IMPLANT
SEAL XI 5MM-8MM UNIVERSAL (MISCELLANEOUS) ×6
SEALANT PROGEL (MISCELLANEOUS) IMPLANT
SEALANT SURG COSEAL 4ML (VASCULAR PRODUCTS) IMPLANT
SEALANT SURG COSEAL 8ML (VASCULAR PRODUCTS) IMPLANT
SET TRI-LUMEN FLTR TB AIRSEAL (TUBING) ×3 IMPLANT
SET TUBE SMOKE EVAC HIGH FLOW (TUBING) ×2 IMPLANT
SHEARS HARMONIC HDI 20CM (ELECTROSURGICAL) IMPLANT
SOLUTION ELECTROLUBE (MISCELLANEOUS) ×3 IMPLANT
SPECIMEN JAR MEDIUM (MISCELLANEOUS) IMPLANT
SPONGE INTESTINAL PEANUT (DISPOSABLE) IMPLANT
SPONGE TONSIL TAPE 1 RFD (DISPOSABLE) IMPLANT
STAPLER 45 SUREFORM CVD (STAPLE) ×3
STAPLER 45 SUREFORM CVD DVNC (STAPLE) IMPLANT
STAPLER CANNULA SEAL DVNC XI (STAPLE) ×4 IMPLANT
STAPLER CANNULA SEAL XI (STAPLE) ×6
STAPLER RELOAD 3.5X45 BLU DVNC (STAPLE) ×8
STAPLER RELOAD 3.5X45 BLUE (STAPLE) ×12
STAPLER RELOAD 4.3X45 GREEN (STAPLE) ×9
STAPLER RELOAD 4.3X45 GRN DVNC (STAPLE) ×6
SUT PDS AB 3-0 SH 27 (SUTURE) IMPLANT
SUT PROLENE 4 0 RB 1 (SUTURE)
SUT PROLENE 4-0 RB1 .5 CRCL 36 (SUTURE) IMPLANT
SUT SILK  1 MH (SUTURE) ×3
SUT SILK 1 MH (SUTURE) ×4 IMPLANT
SUT SILK 1 TIES 10X30 (SUTURE) ×3 IMPLANT
SUT SILK 2 0 SH (SUTURE) ×2 IMPLANT
SUT SILK 2 0SH CR/8 30 (SUTURE) ×2 IMPLANT
SUT SILK 3 0SH CR/8 30 (SUTURE) IMPLANT
SUT VIC AB 1 CTX 36 (SUTURE) ×3
SUT VIC AB 1 CTX36XBRD ANBCTR (SUTURE) ×2 IMPLANT
SUT VIC AB 2-0 CTX 36 (SUTURE) ×3 IMPLANT
SUT VIC AB 3-0 MH 27 (SUTURE) IMPLANT
SUT VIC AB 3-0 X1 27 (SUTURE) ×8 IMPLANT
SUT VICRYL 0 TIES 12 18 (SUTURE) ×3 IMPLANT
SUT VICRYL 0 UR6 27IN ABS (SUTURE) ×6 IMPLANT
SUT VICRYL 2 TP 1 (SUTURE) IMPLANT
SYR 20CC LL (SYRINGE) ×6 IMPLANT
SYR 20ML LL LF (SYRINGE) ×6 IMPLANT
SYSTEM RETRIEVAL ANCHOR 8 (MISCELLANEOUS) ×1 IMPLANT
SYSTEM SAHARA CHEST DRAIN ATS (WOUND CARE) ×3 IMPLANT
TAPE CLOTH 4X10 WHT NS (GAUZE/BANDAGES/DRESSINGS) ×3 IMPLANT
TIP APPLICATOR SPRAY EXTEND 16 (VASCULAR PRODUCTS) IMPLANT
TOWEL GREEN STERILE (TOWEL DISPOSABLE) ×6 IMPLANT
TRAY FOLEY MTR SLVR 16FR STAT (SET/KITS/TRAYS/PACK) ×3 IMPLANT
TROCAR BLADELESS 15MM (ENDOMECHANICALS) IMPLANT
TROCAR XCEL 12X100 BLDLESS (ENDOMECHANICALS) ×3 IMPLANT
TROCAR XCEL BLADELESS 5X75MML (TROCAR) IMPLANT
WATER STERILE IRR 1000ML POUR (IV SOLUTION) ×3 IMPLANT

## 2020-03-11 NOTE — Brief Op Note (Addendum)
03/11/2020  10:18 AM  PATIENT:  Corky Downs Helmer  55 y.o. female  PRE-OPERATIVE DIAGNOSIS:  RIGHT LUNG NODULE  POST-OPERATIVE DIAGNOSIS:  RIGHT LUNG NODULE  PROCEDURE:  Xi ROBOTIC ASSISTED RIGHT THORACOSCOPY RIGHT LOWER LOBE WEDGE RESECTION X 3 LYMPH NODE BIOPSY (LEVEL 9) INTERCOSTAL NERVE BLOCKS- LEVELS 3-10  SURGEON:  Surgeon(s) and Role:    * Melrose Nakayama, MD - Primary  PHYSICIAN ASSISTANT: WAYNE GOLD PA-C  ANESTHESIA:   general  EBL:  50 mL   BLOOD ADMINISTERED:none  DRAINS: (1 27 F ) Blake drain(s) in the RIGHT HEMITHORAX   LOCAL MEDICATIONS USED:   EXPAREL  SPECIMEN:  Source of Specimen:  WEDGE RESECTIONS X 3 AND LEVEL 9 LN  DISPOSITION OF SPECIMEN:  PATHOLOGY AND MICRO FOR AFB  COUNTS:  YES  TOURNIQUET:  * No tourniquets in log *  DICTATION: .Other Dictation: Dictation Number PENDING  PLAN OF CARE: Admit to inpatient   PATIENT DISPOSITION:  PACU - hemodynamically stable.   Delay start of Pharmacological VTE agent (>24hrs) due to surgical blood loss or risk of bleeding: yes  COMPLICATIONS: NO KNOWN

## 2020-03-11 NOTE — Op Note (Signed)
NAME: Laura Ritter, Laura Ritter MEDICAL RECORD NO:[id number] ACCOUNT [id number] DATE OF BIRTH:1965-07-02 FACILITY: [facility] LOCATION: [facility] PHYSICIAN:Dewitt Judice Chaya Jan, MD  OPERATIVE REPORT  DATE OF PROCEDURE:  03/11/2020  PREOPERATIVE DIAGNOSIS:  Lung nodules.  POSTOPERATIVE DIAGNOSIS:  Lung nodules.  PROCEDURE:   Xi robotic-assisted right thoracoscopy, Right lower lobe wedge resection x3, Lymph node biopsy of level 9 node, and Intercostal nerve blocks levels 3-10.  SURGEON:  Modesto Charon, MD  ASSISTANT:  Jadene Pierini, PA-C.  ANESTHESIA:  General.  FINDINGS:  Multiple nodules noted in right lower lobe, dominant nodule on diaphragmatic surface of the lobe.  Frozen section revealed lymphoid cells.  Additional nodule sent for permanent pathology only.  CLINICAL NOTE:  Laura Ritter is a 55 year old woman, who was incidentally found to have a lung nodule in 2010 on a CT done for kidney stones.  She has had waxing and waning nodules since then.  Recently, she had a CT which showed an increase in size of the  nodule in the base of the right lower lobe just above the diaphragm.  Navigational bronchoscopy was nondiagnostic and she was referred for consideration for surgical resection.  The indications, risks, benefits, and alternatives were discussed in detail  with the patient.  She accepted the risks and wished to proceed.  OPERATIVE NOTE:  Laura Ritter was brought to the preoperative holding area on 03/11/2020.  She had placement of a central venous catheter and an arterial blood pressure monitoring line by anesthesia.  She was taken to the operating room, anesthetized and  intubated with a double lumen endotracheal tube.  Intravenous antibiotics were administered.  A Foley catheter was placed.  Sequential compression devices were placed on the calves for DVT prophylaxis.  She was placed in a left lateral decubitus  position.  Active warming blanket was placed.  The right chest was prepped  and draped in the usual sterile fashion.  Single lung ventilation of the left lung was initiated and was tolerated well throughout the procedure.  A timeout was performed.  A solution containing 20 mL of liposomal bupivacaine, 30 mL of 0.5% bupivacaine and 50 mL of saline was prepared.  This solution was used for local at the incision sites as well as for the intercostal nerve blocks.  An incision was made  in the eighth interspace in the mid axillary line, an 8 mm port was inserted.  The thoracoscope was advanced into the chest.  After confirming intrapleural placement, carbon dioxide was insufflated per protocol.  A 12 mm robotic port was placed in the  eighth interspace 5 cm anterior to the camera port.  Intercostal nerve blocks then were performed from the third to the tenth interspace.  A needle was passed from a posterior approach and 10 mL of the bupivacaine solution was injected into a subpleural  plane at each level.  Additional ports were placed in the eighth interspace.  A 12 mm port was placed 5 cm posterior to the camera port and then 5 cm posterior to that, an 8 mm port was placed for the retraction arm.  A 12 mm AirSeal port was placed in  the tenth interspace centered between the 2 anterior ports.  The robotic instruments were inserted with thoracoscopic visualization.  The left lung was retracted superiorly.  The inferior ligament was divided.  A benign appearing level 9 lymph node was identified.  It was removed and sent for permanent pathology.  Inspection of the diaphragmatic surface of the right lower lobe showed an  abnormality in the midportion consistent with the dominant nodule noted on CT.  A wedge resection was performed with sequential firings of the robotic stapler, removing the majority of this nodule.  The specimen was placed into an endoscopic retrieval  bag, removed and sent for frozen section.  A small piece was sent for AFB and fungal cultures.  While awaiting that  result, there was a small nodule about 5 mm in diameter on the very anterior edge of the lower portion of the lower lobe that had some  similar characteristics to the larger nodule.  This was removed with the stapler and sent for permanent pathology.  The frozen section returned showing lymphoid cells.  Additional workup will be necessary for definitive diagnosis, but there was no  evidence of non-small cell carcinoma.  A second smaller subpleural nodule was removed and also sent for permanent pathology.  The chest was copiously irrigated with warm saline.  The robotic instruments were removed.  All the sponges that had been placed  during the procedure were removed.  The robot was undocked.  The chest was copiously irrigated with warm saline.  Final inspection was made with the scope for hemostasis.  A 28-French Blake drain was placed through the original port incision and  directed to the apex.  It was secured to skin with a #1 silk suture.  Dual lung ventilation was resumed.  The remaining incisions were closed with 0 Vicryl fascial sutures and 3-0 Vicryl subcuticular sutures.  Dermabond was applied.  The chest tube was  placed to Pleur-evac on waterseal.  The patient was placed back in a supine position.  She was extubated in the operating room and taken to the postanesthetic care unit in good condition.  All sponge, needle and instrument counts were correct at the end  of the procedure.  HN/NUANCE  D:03/11/2020 T:03/11/2020 JOB:[id number]

## 2020-03-11 NOTE — Discharge Summary (Signed)
Physician Discharge Summary  Patient ID: Laura Ritter MRN: [id number] DOB/AGE: May 16, 1965 55 y.o.  Admit date: 03/11/2020 Discharge date: 03/12/2020  Admission Diagnoses: Lung nodules  Discharge Diagnoses:  Active Problems:   Lung nodules  Patient Active Problem List   Diagnosis Date Noted  . Lung nodules 03/11/2020  . Lung nodule   . UTI (urinary tract infection) 07/27/2015  . PVC (premature ventricular contraction) 04/01/2015  . Multiple pulmonary nodules 03/19/2015  . Heart palpitations 03/01/2015  . Primary osteoarthritis involving multiple joints 07/24/2014  . SI (sacroiliac) joint dysfunction 10/31/2013  . Nonallopathic lesion of sacral region 10/31/2013  . Nonallopathic lesion of thoracic region 10/31/2013  . Nonallopathic lesion of pelvic region 10/31/2013  . Hip flexor tightness 10/31/2013  . Neutropenia, unspecified ([place]) 08/06/2013  . History of kidney stones 07/09/2013  . Abnormal CT scan, lung 07/09/2013  . Menopausal state 07/09/2013  . 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 She was found to have a lung nodule initially back in 2010 on a CT that was done for kidney stones. She has had waxing and waning nodules over that time. In 2017 she had a PET/CT which showed some mild metabolic activity. All of her work-up including a navigational bronchoscopy has been nondiagnostic. She is a lifelong non-smoker.  I saw her in the office on 01/12/2020. We discussed her options and I offered her the option of a robotic wedge resection of the right lower lobe nodule. That is the dominant nodule. If that were to turn out to be a non-small cell carcinoma of the lung and we would proceed with a lobectomy at the same setting.  After initial discussion she and her husband wanted some time to think things over. She then  call to schedule the procedure. Had to be rescheduled because she had COVID-19. She only had mild symptoms with that and has completely recovered.  We again reviewed the images and discussed the options of radiographic follow-up versus surgical resection for definitive diagnosis and treatment. She does not wish to proceed with surgery. She understands the indications, risks, benefits, and alternatives. The risks were outlined in detail in my note from 01/12/2020.  Plan to proceed with robotic right VATS for wedge resection and possible lobectomy on Thursday, 03/11/2020.   PATHOLOGY: Pending  Discharged Condition: good  Hospital Course: The patient was admitted electively and on 03/11/2020 taken the operating room at which time she underwent robotic assisted right thoracoscopy, right lower lobe wedge resection x3, lymph node biopsy, a level 9 node and intercostal nerve blocks levels 3-10.  She tolerated the procedure well and was taken to the postanesthesia care unit in stable condition.  Postoperative hospital course:  The patient is done quite well.  All routine lines monitors and drainage devices have been discontinued in the standard fashion.  Post chest tube removal chest x-ray reveals a very small apical pneumothorax.  Oxygen has been weaned and maintains good saturations on room air.  Laboratory values are stable.  She does not have any anemia.  Renal function and electrolytes are within normal limits.  She has tolerated increasing gradual activities using standard protocols.  Final pathology is currently pending.  On evaluation in the afternoon of postop day 1 it was felt that the patient is quite stable for discharge at this time.  Consults: None  Significant Diagnostic Studies: Routine  postoperative serial labs and chest x-ray'S  Treatments:  OPERATIVE REPORT  DATE OF PROCEDURE:  03/11/2020  PREOPERATIVE DIAGNOSIS:  Lung nodules.  POSTOPERATIVE DIAGNOSIS:  Lung  nodules.  PROCEDURE:  Xi robotic-assisted right thoracoscopy, right lower lobe wedge resection x3, lymph node biopsy, a level 9 node and intercostal nerve blocks levels 3-10.  SURGEON:  Modesto Charon, MD  ASSISTANT:  Jadene Pierini.  ANESTHESIA:  General.   Discharge Exam: Blood pressure 113/69, pulse (!) 58, temperature 98 F (36.7 C), temperature source Oral, resp. rate 16, height 5\' 7"  (1.702 m), weight 61.2 kg, last menstrual period 01/12/2014, SpO2 96 %.  General appearance: alert, cooperative and no distress Neurologic: intact Heart: regular rate and rhythm Lungs: clear to auscultation bilaterally no air leak  Disposition: Discharge disposition: 01-Home or Self Care        Allergies as of 03/12/2020   No Known Allergies     Medication List    TAKE these medications   Calcium Carbonate-Vitamin D 600-400 MG-UNIT tablet Take 1 tablet by mouth daily.   Estradiol 10 MCG Tabs vaginal tablet Insert one tablet vaginally twice weekly for vaginal dryness   FISH OIL PO Take 1 tablet by mouth daily.   MULTIVITAMIN PO Take 1 tablet by mouth daily.   traMADol 50 MG tablet Commonly known as: ULTRAM Take 1 tablet (50 mg total) by mouth every 6 (six) hours as needed for severe pain (mild pain).   Vitamin D-3 25 MCG (1000 UT) Caps Take 2,000 Units by mouth daily.       Follow-up Information    Melrose Nakayama, MD.   Specialty: Cardiothoracic Surgery Why: Please see discharge paperwork for follow-up appointment with surgeon as well as with nurse at Dr. Leonarda Salon office for suture removal. Contact information: [address] [place] [address] [phone]        [facility] on 03/23/2020.   Specialty: Cardiothoracic Surgery Why: PA/LAT CXR (to be taken at [facility] (which is in the same building as Dr. Leonarda Salon office) on 02/22 at 12:30 pm;Appointment time is at 1:00  pm Contact information: [place], [place] Maverick Coggon PA-C 03/12/2020, 1:54 PM

## 2020-03-11 NOTE — Anesthesia Procedure Notes (Signed)
Arterial Line Insertion Start/End2/10/2020 7:00 AM, 03/11/2020 7:15 AM Performed by: Imagene Riches, CRNA, CRNA  Preanesthetic checklist: patient identified, IV checked, site marked, risks and benefits discussed, surgical consent, monitors and equipment checked, pre-op evaluation, timeout performed and anesthesia consent Left, radial was placed Catheter size: 20 G Hand hygiene performed  and maximum sterile barriers used  Allen's test indicative of satisfactory collateral circulation Attempts: 2 Procedure performed without using ultrasound guided technique. Following insertion, dressing applied and Biopatch. Post procedure assessment: normal  Patient tolerated the procedure well with no immediate complications.

## 2020-03-11 NOTE — Interval H&P Note (Signed)
History and Physical Interval Note:  03/11/2020 7:46 AM  Laura Ritter  has presented today for surgery, with the diagnosis of RIGHT LUNG NODULE.  The various methods of treatment have been discussed with the patient and family. After consideration of risks, benefits and other options for treatment, the patient has consented to  Procedure(s): XI ROBOTIC ASSISTED THORASCOPY-RIGHT LOWER LOBE WEDGE RESECTION (Right) possible XI ROBOTIC ASSISTED THORASCOPY-LOBECTOMY (Right) as a surgical intervention.  The patient's history has been reviewed, patient examined, no change in status, stable for surgery.  I have reviewed the patient's chart and labs.  Questions were answered to the patient's satisfaction.     Melrose Nakayama

## 2020-03-11 NOTE — Anesthesia Procedure Notes (Signed)
Procedure Name: Intubation Date/Time: 03/11/2020 8:10 AM Performed by: Imagene Riches, CRNA Pre-anesthesia Checklist: Patient identified, Emergency Drugs available, Suction available and Patient being monitored Patient Re-evaluated:Patient Re-evaluated prior to induction Oxygen Delivery Method: Circle System Utilized Preoxygenation: Pre-oxygenation with 100% oxygen Induction Type: IV induction Ventilation: Mask ventilation without difficulty Laryngoscope Size: Mac and 3 Grade View: Grade I Tube type: Oral Endobronchial tube: Left and Double lumen EBT and 37 Fr Number of attempts: 1 Airway Equipment and Method: Stylet and Oral airway Placement Confirmation: ETT inserted through vocal cords under direct vision,  positive ETCO2 and breath sounds checked- equal and bilateral Secured at: 29 cm Tube secured with: Tape Dental Injury: Teeth and Oropharynx as per pre-operative assessment

## 2020-03-11 NOTE — Discharge Instructions (Signed)
[facility] OFFICE  [phone]  Robot-Assisted Thoracic Surgery, Care After The following information offers guidance on how to care for yourself after your procedure. Your health care provider may also give you more specific instructions. If you have problems or questions, contact your health care provider. What can I expect after the procedure? After the procedure, it is common to have:  Some pain and aches in the area of your surgical incisions.  Pain when breathing in (inhaling) and coughing.  Tiredness (fatigue).  Trouble sleeping.  Constipation. Follow these instructions at home: Medicines  Take over-the-counter and prescription medicines only as told by your health care provider.  If you were prescribed an antibiotic medicine, take it as told by your health care provider. Do not stop taking the antibiotic even if you start to feel better.  Talk with your health care provider about safe and effective ways to manage pain after your procedure. Pain management should fit your specific health needs.  Take pain medicine before pain becomes severe. Relieving and controlling your pain will make breathing easier for you.  Ask your health care provider if the medicine prescribed to you requires you to avoid driving or using machinery.  You may shower with soap and water, pat incisions dry gently Eating and drinking Follow instructions from your health care provider about eating or drinking restrictions. These will vary depending on what procedure you had. Your health care provider may recommend:  A liquid diet or soft diet for the first few days.  Meals that are smaller and more frequent.  A diet of fruits, vegetables, whole grains, and low-fat proteins.  Limiting foods that are high in fat and processed sugar, including fried or sweet foods. Incision care  Follow instructions from your health care provider about how to take care of your incisions. Make sure you: ? Wash your hands  with soap and water for at least 20 seconds before and after you change your bandage (dressing). If soap and water are not available, use hand sanitizer. ? Change your dressing as told by your health care provider. ? Leave stitches (sutures), skin glue, or adhesive strips in place. These skin closures may need to stay in place for 2 weeks or longer. If adhesive strip edges start to loosen and curl up, you may trim the loose edges. Do not remove adhesive strips completely unless your health care provider tells you to do that.  Check your incision area every day for signs of infection. Check for: ? Redness, swelling, or more pain. ? Fluid or blood. ? Warmth. ? Pus or a bad smell. Activity  Return to your normal activities as told by your health care provider. Ask your health care provider what activities are safe for you.  YOU MAY DRIVE WHEN NOT TAKING PAIN MEDICINE AND ARE COMFORTABLE ENOUGH WITH DRIVING MOVEMENTS  Do not lift anything that is heavier than 10 lb (4.5 kg), or the limit that you are told, until your health care provider says that it is safe.  Rest as told by your health care provider.  Avoid sitting for a long time without moving. Get up to take short walks every 1-2 hours. This is important to improve blood flow and breathing. Ask for help if you feel weak or unsteady.  Do exercises as told by your health care provider. Pneumonia prevention  Do deep breathing exercises and cough regularly as directed. This helps clear mucus and opens your lungs. Doing this helps prevent lung infection (pneumonia).  If you were given an incentive spirometer, use it as told. An incentive spirometer is a tool that measures how well you are filling your lungs with each breath.  Coughing may hurt less if you try to support your chest. This is called splinting. Try one of these when you cough: ? Hold a pillow against your chest. ? Place the palms of both hands on top of your incision  area.  Do not use any products that contain nicotine or tobacco. These products include cigarettes, chewing tobacco, and vaping devices, such as e-cigarettes. If you need help quitting, ask your health care provider.  Avoid secondhand smoke.   General instructions  If you have a drainage tube: ? Follow instructions from your health care provider about how to take care of it. ? Do not travel by airplane after your tube is removed until your health care provider tells you it is safe.  You may need to take these actions to prevent or treat constipation: ? Drink enough fluid to keep your urine pale yellow. ? Take over-the-counter or prescription medicines. ? Eat foods that are high in fiber, such as beans, whole grains, and fresh fruits and vegetables. ? Limit foods that are high in fat and processed sugars, such as fried or sweet foods.  Keep all follow-up visits. This is important. Contact a health care provider if:  You have redness, swelling, or more pain around an incision.  You have fluid or blood coming from an incision.  An incision feels warm to the touch.  You have pus or a bad smell coming from an incision.  You have a fever.  You cannot eat or drink without vomiting.  Your pain medicine is not controlling your pain. Get help right away if:  You have chest pain.  Your heart is beating quickly.  You have trouble breathing.  You have trouble speaking.  You are confused.  You feel weak or dizzy, or you faint. These symptoms may represent a serious problem that is an emergency. Do not wait to see if the symptoms will go away. Get medical help right away. Call your local emergency services (911 in the [place] Do not drive yourself to the hospital. Summary  Talk with your health care provider about safe and effective ways to manage pain after your procedure. Pain management should fit your specific health needs.  Return to your normal activities as told by your  health care provider. Ask your health care provider what activities are safe for you.  Do deep breathing exercises and cough regularly as directed. This helps to clear mucus and prevent pneumonia. If it hurts to cough, ease pain by holding a pillow against your chest or by placing the palms of both hands over your incisions. This information is not intended to replace advice given to you by your health care provider. Make sure you discuss any questions you have with your health care provider. Document Revised: 10/10/2019 Document Reviewed: 10/10/2019 Elsevier Patient Education  2021 [employer].

## 2020-03-11 NOTE — Transfer of Care (Signed)
Immediate Anesthesia Transfer of Care Note  Patient: Laura Ritter  Procedure(s) Performed: XI ROBOTIC ASSISTED THORASCOPY - RIGHT LOWER LOBE WEDGE RESECTION (Right Chest) INTERCOSTAL NERVE BLOCK (Right Chest) LYMPH NODE DISSECTION (N/A Chest)  Patient Location: PACU  Anesthesia Type:General  Level of Consciousness: drowsy  Airway & Oxygen Therapy: Patient Spontanous Breathing and Patient connected to nasal cannula oxygen  Post-op Assessment: Report given to RN and Post -op Vital signs reviewed and stable  Post vital signs: Reviewed and stable  Last Vitals:  Vitals Value Taken Time  BP 110/62 03/11/20 1030  Temp    Pulse 70 03/11/20 1033  Resp 17 03/11/20 1033  SpO2 100 % 03/11/20 1033  Vitals shown include unvalidated device data.  Last Pain:  Vitals:   03/11/20 0638  TempSrc:   PainSc: 0-No pain      Patients Stated Pain Goal: 5 (47/18/55 0158)  Complications: No complications documented.

## 2020-03-12 ENCOUNTER — Inpatient Hospital Stay (HOSPITAL_COMMUNITY): Payer: No Typology Code available for payment source

## 2020-03-12 ENCOUNTER — Encounter (HOSPITAL_COMMUNITY): Payer: Self-pay | Admitting: Thoracic Surgery (Cardiothoracic Vascular Surgery)

## 2020-03-12 LAB — BASIC METABOLIC PANEL
Anion gap: 6 (ref 5–15)
BUN: 10 mg/dL (ref 6–20)
CO2: 26 mmol/L (ref 22–32)
Calcium: 8.1 mg/dL — ABNORMAL LOW (ref 8.9–10.3)
Chloride: 104 mmol/L (ref 98–111)
Creatinine, Ser: 0.75 mg/dL (ref 0.44–1.00)
GFR, Estimated: >60 mL/min (ref >60)
Glucose, Bld: 159 mg/dL — ABNORMAL HIGH (ref 70–99)
Potassium: 4.3 mmol/L (ref 3.5–5.1)
Sodium: 136 mmol/L (ref 135–145)

## 2020-03-12 LAB — CBC
HCT: 37.4 % (ref 36.0–46.0)
Hemoglobin: 12.1 g/dL (ref 12.0–15.0)
MCH: 28.8 pg (ref 26.0–34.0)
MCHC: 32.4 g/dL (ref 30.0–36.0)
MCV: 89 fL (ref 80.0–100.0)
Platelets: 93 10*3/uL — ABNORMAL LOW (ref 150–400)
RBC: 4.2 MIL/uL (ref 3.87–5.11)
RDW: 13 % (ref 11.5–15.5)
WBC: 5 10*3/uL (ref 4.0–10.5)
nRBC: 0 % (ref 0.0–0.2)

## 2020-03-12 LAB — ACID FAST SMEAR (AFB, MYCOBACTERIA): Acid Fast Smear: NEGATIVE

## 2020-03-12 MED ORDER — TRAMADOL HCL 50 MG PO TABS: 50.0000 mg | ORAL_TABLET | Freq: Four times a day (QID) | ORAL | 0 refills | Status: DC | PRN

## 2020-03-12 NOTE — Progress Notes (Signed)
Follow up chest x ray shows a small right apical pneumothorax. I have made an appointment to see PA with a PA/LAT CXR on 02/22 as her appointment to see Dr. Roxan Hockey is not until 03/01. Patient agreeable to plan and will be discharged. Instructions given.

## 2020-03-12 NOTE — Anesthesia Postprocedure Evaluation (Signed)
Anesthesia Post Note  Patient: Isla Sabree Dubow  Procedure(s) Performed: XI ROBOTIC ASSISTED THORASCOPY - RIGHT LOWER LOBE WEDGE RESECTION (Right Chest) INTERCOSTAL NERVE BLOCK (Right Chest) LYMPH NODE DISSECTION (N/A Chest)     Patient location during evaluation: PACU Anesthesia Type: General Level of consciousness: awake and alert Pain management: pain level controlled Vital Signs Assessment: post-procedure vital signs reviewed and stable Respiratory status: spontaneous breathing, nonlabored ventilation, respiratory function stable and patient connected to nasal cannula oxygen Cardiovascular status: blood pressure returned to baseline and stable Postop Assessment: no apparent nausea or vomiting Anesthetic complications: no   No complications documented.  Last Vitals:  Vitals:   03/12/20 0400 03/12/20 0805  BP: 108/65 113/69  Pulse: 62 (!) 58  Resp: 20 16  Temp: 36.6 C 36.7 C  SpO2: 93% 96%    Last Pain:  Vitals:   03/12/20 0805  TempSrc: Oral  PainSc:                  [place] S

## 2020-03-12 NOTE — Progress Notes (Signed)
1 Day Post-Op Procedure(s) (LRB): XI ROBOTIC ASSISTED THORASCOPY - RIGHT LOWER LOBE WEDGE RESECTION (Right) INTERCOSTAL NERVE BLOCK (Right) LYMPH NODE DISSECTION (N/A) Subjective: No complaints this AM  Objective: Vital signs in last 24 hours: Temp:  [97.6 F (36.4 C)-97.9 F (36.6 C)] 97.9 F (36.6 C) (02/11 0400) Pulse Rate:  [59-78] 62 (02/11 0400) Cardiac Rhythm: Normal sinus rhythm (02/10 2030) Resp:  [12-20] 20 (02/11 0400) BP: (96-116)/(52-67) 108/65 (02/11 0400) SpO2:  [93 %-100 %] 93 % (02/11 0400) Arterial Line BP: (88-119)/(38-59) 94/40 (02/10 1215)  Hemodynamic parameters for last 24 hours:    Intake/Output from previous day: 02/10 0701 - 02/11 0700 In: 2996.7 [P.O.:240; I.V.:2756.7] Out: 0867 [Urine:2875; Blood:50; Chest Tube:230] Intake/Output this shift: Total I/O In: -  Out: 120 [Chest Tube:120]  General appearance: alert, cooperative and no distress Neurologic: intact Heart: regular rate and rhythm Lungs: clear to auscultation bilaterally no air leak  Lab Results: Recent Labs    03/09/20 1339 03/12/20 0003  WBC 3.4* 5.0  HGB 13.7 12.1  HCT 41.3 37.4  PLT 164 93*   BMET:  Recent Labs    03/09/20 1339 03/12/20 0003  NA 138 136  K 4.5 4.3  CL 104 104  CO2 24 26  GLUCOSE 91 159*  BUN 15 10  CREATININE 0.73 0.75  CALCIUM 9.1 8.1*    PT/INR:  Recent Labs    03/09/20 1339  LABPROT 13.1  INR 1.0   ABG    Component Value Date/Time   PHART 7.414 03/09/2020 1351   HCO3 26.5 03/09/2020 1351   TCO2 29 11/16/2007 1700   O2SAT 98.4 03/09/2020 1351   CBG (last 3)  No results for input(s): GLUCAP in the last 72 hours.  Assessment/Plan: S/P Procedure(s) (LRB): XI ROBOTIC ASSISTED THORASCOPY - RIGHT LOWER LOBE WEDGE RESECTION (Right) INTERCOSTAL NERVE BLOCK (Right) LYMPH NODE DISSECTION (N/A) - POD # 1  Looks great No air leak- dc chest tube Dc Foley Possibly home this afternoon   LOS: 1 day    Melrose Nakayama 03/12/2020

## 2020-03-12 NOTE — Plan of Care (Signed)

## 2020-03-16 LAB — AEROBIC/ANAEROBIC CULTURE W GRAM STAIN (SURGICAL/DEEP WOUND)
Culture: NO GROWTH
Gram Stain: NONE SEEN

## 2020-03-17 LAB — SURGICAL PATHOLOGY

## 2020-03-19 ENCOUNTER — Other Ambulatory Visit: Payer: Self-pay

## 2020-03-19 ENCOUNTER — Ambulatory Visit (INDEPENDENT_AMBULATORY_CARE_PROVIDER_SITE_OTHER): Payer: Self-pay

## 2020-03-19 DIAGNOSIS — Z4802 Encounter for removal of sutures: Secondary | ICD-10-CM

## 2020-03-19 NOTE — Progress Notes (Signed)
Removed 1 suture from chest tube incision site no signs of infection and patient tolerated well.

## 2020-03-22 ENCOUNTER — Other Ambulatory Visit: Payer: Self-pay | Admitting: Thoracic Surgery (Cardiothoracic Vascular Surgery)

## 2020-03-22 DIAGNOSIS — R911 Solitary pulmonary nodule: Secondary | ICD-10-CM

## 2020-03-23 ENCOUNTER — Other Ambulatory Visit: Payer: Self-pay

## 2020-03-23 ENCOUNTER — Ambulatory Visit (INDEPENDENT_AMBULATORY_CARE_PROVIDER_SITE_OTHER): Payer: Self-pay | Admitting: Physician Assistant

## 2020-03-23 ENCOUNTER — Ambulatory Visit
Admission: RE | Admit: 2020-03-23 | Discharge: 2020-03-23 | Disposition: A | Discharge status: Home / Self Care | Payer: No Typology Code available for payment source | Source: Ambulatory Visit | Attending: Thoracic Surgery (Cardiothoracic Vascular Surgery) | Admitting: Thoracic Surgery (Cardiothoracic Vascular Surgery)

## 2020-03-23 VITALS — BP 119/75 | HR 61 | Resp 20 | Ht 67.0 in | Wt 140.0 lb

## 2020-03-23 DIAGNOSIS — Z9889 Other specified postprocedural states: Secondary | ICD-10-CM

## 2020-03-23 DIAGNOSIS — R911 Solitary pulmonary nodule: Secondary | ICD-10-CM

## 2020-03-23 NOTE — Patient Instructions (Addendum)
A referral will be made to oncology.  Follow up with Dr. Roxan Hockey in 1 month.

## 2020-03-23 NOTE — Progress Notes (Addendum)
  HPI:  Patient returns for routine postoperative follow-up having undergone robotic assisted right lower lobe wedge resection x 3 for evaluation of multiple longstanding pulmonary nodules.  She had uneventful postoperative course.  She had no air leak on the first postoperative basilar chest tube was removed.  Follow-up chest x-ray showed a small, tender to 15 mm right apical pneumothorax.  Her respiratory status was stable so she was discharged on the same day.  She was asked to return today for follow-up chest x-ray. She feels well with no shortness of breath. The sutures have been removed and the incisions are healing well.     Current Outpatient Medications  Medication Sig Dispense Refill  . Calcium Carbonate-Vitamin D 600-400 MG-UNIT tablet Take 1 tablet by mouth daily.    . Cholecalciferol (VITAMIN D-3) 25 MCG (1000 UT) CAPS Take 2,000 Units by mouth daily.     . Estradiol 10 MCG TABS vaginal tablet Insert one tablet vaginally twice weekly for vaginal dryness 24 tablet 0  . Multiple Vitamins-Minerals (MULTIVITAMIN PO) Take 1 tablet by mouth daily.     . Omega-3 Fatty Acids (FISH OIL PO) Take 1 tablet by mouth daily.     . traMADol (ULTRAM) 50 MG tablet Take 1 tablet (50 mg total) by mouth every 6 (six) hours as needed for severe pain (mild pain). 30 tablet 0   No current facility-administered medications for this visit.    Physical Exam   VS: BP 119/75     Pulse 61      Respirations 20       O2 sat 100% on room air  Heart- RRR Chest- [place] sounds are clear full and equal  Diagnostic Tests:  Today's chest x-ray has not been read by the radiologist yet but shows complete resolution of the right apical PTX.     Impression / Plan: Resolution of small right apical pneumothorax following robotic assisted right lower wedge biopsies x3 on 03/11/2020.  Dr. Roxan Hockey saw Ms. Pelc concurrently with this visit and pathology was reviewed.  A referral will be made to oncology for  suspected lymphoma.  She is asked to follow up with Dr. Roxan Hockey in 1 month.    Antony Odea, PA-C [facility] [phone]

## 2020-03-24 ENCOUNTER — Telehealth: Payer: Self-pay | Admitting: Internal Medicine

## 2020-03-24 ENCOUNTER — Encounter: Payer: Self-pay | Admitting: *Deleted

## 2020-03-24 ENCOUNTER — Encounter (HOSPITAL_COMMUNITY): Payer: Self-pay | Admitting: Thoracic Surgery (Cardiothoracic Vascular Surgery)

## 2020-03-24 ENCOUNTER — Encounter: Payer: Self-pay | Admitting: Family Medicine

## 2020-03-24 DIAGNOSIS — R918 Other nonspecific abnormal finding of lung field: Secondary | ICD-10-CM

## 2020-03-24 NOTE — Telephone Encounter (Signed)
Received a new hem referral from Dr. Roxan Hockey for Laura Ritter. Laura Ritter has been scheduled to see Dr. Julien Nordmann on 3/9 at 2:15pm w/labs at 1:45pm. I cld and lft the appt date and time on the pt's vm.

## 2020-03-24 NOTE — Telephone Encounter (Signed)
Laura Ritter lft a vm to confirm her appt w/Dr. Julien Nordmann on 3/9 at 2:15pm

## 2020-03-24 NOTE — Progress Notes (Signed)
Dr. Roxan Hockey contacted me regarding Ms. Mellinger case.  I updated Dr. Julien Nordmann and he would like to see patient in a few weeks.  I updated Dr. Roxan Hockey, will complete referral, and notify scheduling team to call and schedule.

## 2020-03-25 ENCOUNTER — Other Ambulatory Visit: Payer: Self-pay | Admitting: Physician Assistant

## 2020-03-25 MED ORDER — GABAPENTIN 300 MG PO CAPS: ORAL_CAPSULE | ORAL | 1 refills | Status: DC

## 2020-03-26 ENCOUNTER — Encounter (HOSPITAL_COMMUNITY): Payer: Self-pay | Admitting: Thoracic Surgery (Cardiothoracic Vascular Surgery)

## 2020-03-30 ENCOUNTER — Encounter: Payer: No Typology Code available for payment source | Admitting: Thoracic Surgery (Cardiothoracic Vascular Surgery)

## 2020-03-31 ENCOUNTER — Other Ambulatory Visit: Payer: Self-pay | Admitting: Obstetrics and Gynecology

## 2020-03-31 DIAGNOSIS — N952 Postmenopausal atrophic vaginitis: Secondary | ICD-10-CM

## 2020-03-31 LAB — SURGICAL PATHOLOGY

## 2020-03-31 NOTE — Telephone Encounter (Signed)
Last AEX 04/18/19 Scheduled AEX 04/21/20

## 2020-04-05 ENCOUNTER — Other Ambulatory Visit: Payer: Self-pay | Admitting: Medical Oncology

## 2020-04-05 DIAGNOSIS — R918 Other nonspecific abnormal finding of lung field: Secondary | ICD-10-CM

## 2020-04-07 ENCOUNTER — Encounter: Payer: Self-pay | Admitting: Internal Medicine

## 2020-04-07 ENCOUNTER — Inpatient Hospital Stay: Payer: No Typology Code available for payment source | Attending: Internal Medicine | Admitting: Internal Medicine

## 2020-04-07 ENCOUNTER — Inpatient Hospital Stay: Payer: No Typology Code available for payment source

## 2020-04-07 ENCOUNTER — Other Ambulatory Visit: Payer: Self-pay

## 2020-04-07 VITALS — BP 119/80 | HR 65 | Temp 98.3°F | Resp 17 | Ht 67.0 in | Wt 142.2 lb

## 2020-04-07 DIAGNOSIS — C884 Extranodal marginal zone B-cell lymphoma of mucosa-associated lymphoid tissue [MALT-lymphoma]: Secondary | ICD-10-CM | POA: Insufficient documentation

## 2020-04-07 DIAGNOSIS — Z79899 Other long term (current) drug therapy: Secondary | ICD-10-CM | POA: Insufficient documentation

## 2020-04-07 DIAGNOSIS — R059 Cough, unspecified: Secondary | ICD-10-CM | POA: Insufficient documentation

## 2020-04-07 DIAGNOSIS — Z87442 Personal history of urinary calculi: Secondary | ICD-10-CM | POA: Diagnosis not present

## 2020-04-07 DIAGNOSIS — R918 Other nonspecific abnormal finding of lung field: Secondary | ICD-10-CM

## 2020-04-07 LAB — CMP (CANCER CENTER ONLY)
ALT: 24 U/L (ref 0–44)
AST: 24 U/L (ref 15–41)
Albumin: 4.4 g/dL (ref 3.5–5.0)
Alkaline Phosphatase: 51 U/L (ref 38–126)
Anion gap: 6 (ref 5–15)
BUN: 13 mg/dL (ref 6–20)
CO2: 29 mmol/L (ref 22–32)
Calcium: 9.4 mg/dL (ref 8.9–10.3)
Chloride: 105 mmol/L (ref 98–111)
Creatinine: 0.84 mg/dL (ref 0.44–1.00)
GFR, Estimated: >60 mL/min (ref >60)
Glucose, Bld: 82 mg/dL (ref 70–99)
Potassium: 4 mmol/L (ref 3.5–5.1)
Sodium: 140 mmol/L (ref 135–145)
Total Bilirubin: 0.6 mg/dL (ref 0.3–1.2)
Total Protein: 7.2 g/dL (ref 6.5–8.1)

## 2020-04-07 LAB — CBC WITH DIFFERENTIAL (CANCER CENTER ONLY)
Abs Immature Granulocytes: 0 10*3/uL (ref 0.00–0.07)
Basophils Absolute: 0 10*3/uL (ref 0.0–0.1)
Basophils Relative: 1 %
Eosinophils Absolute: 0.1 10*3/uL (ref 0.0–0.5)
Eosinophils Relative: 4 %
HCT: 41 % (ref 36.0–46.0)
Hemoglobin: 13.8 g/dL (ref 12.0–15.0)
Immature Granulocytes: 0 %
Lymphocytes Relative: 27 %
Lymphs Abs: 0.9 10*3/uL (ref 0.7–4.0)
MCH: 29.2 pg (ref 26.0–34.0)
MCHC: 33.7 g/dL (ref 30.0–36.0)
MCV: 86.7 fL (ref 80.0–100.0)
Monocytes Absolute: 0.3 10*3/uL (ref 0.1–1.0)
Monocytes Relative: 9 %
Neutro Abs: 2 10*3/uL (ref 1.7–7.7)
Neutrophils Relative %: 60 %
Platelet Count: 185 10*3/uL (ref 150–400)
RBC: 4.73 MIL/uL (ref 3.87–5.11)
RDW: 12.7 % (ref 11.5–15.5)
WBC Count: 3.3 10*3/uL — ABNORMAL LOW (ref 4.0–10.5)
nRBC: 0 % (ref 0.0–0.2)

## 2020-04-07 NOTE — Progress Notes (Signed)
[place] [phone]   [phone]  CONSULT NOTE  REFERRING PHYSICIAN: Dr. Modesto Charon  REASON FOR CONSULTATION:  55 years old white female recently diagnosed with MALT lymphoma of the lung.  HPI Laura Ritter is a 55 y.o. female with past medical history significant for kidney stone, PVCs as well as back surgery.  The patient is a never smoker.  She has a history of multiple pulmonary nodules since June 2010 and has been followed by Dr. Vaughan Browner with annual CT scan.  He also had a PET scan on 10/20/2015 that showed 1.1 cm lingular pulmonary nodule and 2 subcentimeter right lower lobe pulmonary nodules with minimal enlargement since 2012 and demonstrating low-grade FDG uptake.  There was also 0.6 cm right middle lobe nodule that was stable with low grade FDG uptake and the differential diagnosis at that time included indolent adenocarcinoma versus indolent inflammatory or infectious etiology.  There was no evidence of hypermetabolic lymphadenopathy or distant metastatic disease.  Repeat CT scan of the chest on November 21, 2017 showed the dominant right lower lobe nodule had enlarged slightly and was worrisome for primary bronchogenic carcinoma.  There were additional bilateral pulmonary nodules with indolent growth and right lower lobe and lingular nodules.  The patient followed by observation.  She had repeat CT scan of the chest without contrast on November 13, 2019 and it showed right lower lobe nodule measuring 1.3 x 1.4 cm.  Subsolid nodule in the right lung base measuring 0.8 cm.  A lingular nodule measuring 1.1 x 0.7 cm and a pleural-based nodule measuring 0.5 cm.  There was also adjacent 0.3 cm nodule and new pulmonary nodule measuring 0.4 cm.  The 0.9 cm nodule along the major fissure in the right chest was unchanged.  The patient was referred to Dr. Valeta Harms and on November 28, 2019 she underwent bronchoscopy with electromagnetic navigation bronchoscopy  but the final cytology were negative for malignancy.  Repeat CT scan of the chest on March 03, 2020 showed several at least 7 pulmonary nodules scattered in the lungs bilaterally and all were stable compared to November 13, 2019.  The dominant regular nodule measured 1.7 cm in the right lower lobe mildly increased compared to the more remote CT chest studies.  There was no thoracic adenopathy. The patient was referred to Dr. Roxan Hockey and on March 11, 2020 she underwent robotic assisted right thoracoscopy with right lower lobe wedge resection x3 and lymph node biopsy of level 9.  The final pathology ([id number]) showed lymphoid proliferation with monoclonal plasma cell population.  The lymph node from level 9 showed scant lymphoid tissues.  The other wedge resection of the right lower lobe showed lymphoid proliferation with monoclonal plasma cell population and discharged with the resection showed benign lung tissue with focal lymphoid infiltrate.  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 Dr. Roxan Hockey kindly referred the patient to me today for evaluation and recommendation regarding her condition. When seen today she was accompanied by her husband Event organiser.  The patient continues to have some nerve pain around the lower rib from the surgical resection.  She also has mild cough but no significant shortness of breath or hemoptysis.  She denied having any weight loss or night sweats.  She has no nausea, vomiting, diarrhea or constipation.  She has no headache  or visual changes. Family history significant for mother with dyslipidemia and alcohol abuse.  Father has history of hypertension, parkinsonism and alcohol abuse. The patient is married and has 3 children.  She was accompanied  today by her husband Event organiser.  She works as a Community education officer at [employer].  She has no history of smoking and drinks alcohol occasionally and no history of drug abuse.  Past Medical History:  Diagnosis Date  . History of kidney stones 05/2007   followed yearly follow up- Dr. Jeffie Pollock  . Leukopenia   . PVC (premature ventricular contraction) 04/01/2015   anxiety related    Past Surgical History:  Procedure Laterality Date  . BACK SURGERY  1/16   herniated disc  . BRONCHIAL BIOPSY  11/28/2019   Procedure: BRONCHIAL BIOPSIES;  Surgeon: Garner Nash, DO;  Location: [facility];  Service: Pulmonary;;  . BRONCHIAL BRUSHINGS  11/28/2019   Procedure: BRONCHIAL BRUSHINGS;  Surgeon: Garner Nash, DO;  Location: [place];  Service: Pulmonary;;  . BRONCHIAL NEEDLE ASPIRATION BIOPSY  11/28/2019   Procedure: BRONCHIAL NEEDLE ASPIRATION BIOPSIES;  Surgeon: Garner Nash, DO;  Location: [facility];  Service: Pulmonary;;  . BRONCHIAL WASHINGS  11/28/2019   Procedure: BRONCHIAL WASHINGS;  Surgeon: Garner Nash, DO;  Location: [place];  Service: Pulmonary;;  . CESAREAN SECTION     x3  . CHOLECYSTECTOMY  2001  . COLONOSCOPY    . EXTRACORPOREAL SHOCK WAVE LITHOTRIPSY Right 04/19/2017   Procedure: RIGHT EXTRACORPOREAL SHOCK WAVE LITHOTRIPSY (ESWL);  Surgeon: Ceasar Mons, MD;  Location: [facility];  Service: Urology;  Laterality: Right;  . FIDUCIAL MARKER PLACEMENT  11/28/2019   Procedure: FIDUCIAL MARKER PLACEMENT;  Surgeon: Garner Nash, DO;  Location: [facility];  Service: Pulmonary;;  . INTERCOSTAL NERVE BLOCK Right 03/11/2020   Procedure: INTERCOSTAL NERVE BLOCK;  Surgeon: Melrose Nakayama, MD;  Location: [place];  Service: Thoracic;  Laterality: Right;  . LAPAROSCOPIC CHOLECYSTECTOMY  2001  . LYMPH NODE DISSECTION N/A 03/11/2020   Procedure: LYMPH NODE DISSECTION;  Surgeon: Melrose Nakayama, MD;  Location: [place];  Service: Thoracic;  Laterality: N/A;   . REFRACTIVE SURGERY Bilateral 2006   lasik  . SPINE SURGERY    . VIDEO BRONCHOSCOPY WITH ENDOBRONCHIAL NAVIGATION N/A 11/28/2019   Procedure: VIDEO BRONCHOSCOPY WITH ENDOBRONCHIAL NAVIGATION;  Surgeon: Garner Nash, DO;  Location: [facility];  Service: Pulmonary;  Laterality: N/A;  . WISDOM TOOTH EXTRACTION    . XI ROBOTIC ASSISTED THORASCOPY - RIGHT LOWER LOBE WEDGE RESECTION (Right)  03/11/2020   path pending. lung nodules    Family History  Problem Relation Age of Onset  . Hyperlipidemia Mother   . Alcohol abuse Mother   . Arthritis Mother   . Alzheimer's disease Mother   . Hypertension Father   . Parkinson's disease Father   . Alcohol abuse Father   . Kidney Stones Brother   . Alcohol abuse Brother   . Migraines Son     Social History Social History   Tobacco Use  . Smoking status: Never Smoker  . Smokeless tobacco: Never Used  Vaping Use  . Vaping Use: Never used  Substance Use Topics  . Alcohol use: Yes    Alcohol/week: 4.0 standard drinks    Types: 4 Standard drinks or equivalent per week  . Drug use: No    No Known Allergies  Current Outpatient Medications  Medication Sig Dispense Refill  . Calcium Carbonate-Vitamin D 600-400 MG-UNIT tablet Take 1  tablet by mouth daily.    . Cholecalciferol (VITAMIN D-3) 25 MCG (1000 UT) CAPS Take 2,000 Units by mouth daily.     . Estradiol 10 MCG TABS vaginal tablet INSERT 1 TABLET VAGINALLY  TWICE WEEKLY FOR VAGINAL  DRYNESS 24 tablet 0  . gabapentin (NEURONTIN) 300 MG capsule Take 1 capsule (300mg ) by mouth daily for 1 week then increase to 1 capsule twice daily. 40 capsule 1  . loratadine (CLARITIN) 5 MG chewable tablet Chew 5 mg by mouth daily. PRN    . melatonin 5 MG TABS Take 5 mg by mouth. PRN    . Multiple Vitamins-Minerals (MULTIVITAMIN PO) Take 1 tablet by mouth daily.     . Omega-3 Fatty Acids (FISH OIL PO) Take 1 tablet by mouth daily.     . traMADol (ULTRAM) 50 MG tablet Take 1 tablet (50 mg total) by  mouth every 6 (six) hours as needed for severe pain (mild pain). 30 tablet 0   No current facility-administered medications for this visit.    Review of Systems  Constitutional: negative Eyes: negative Ears, nose, mouth, throat, and face: negative Respiratory: positive for cough and pleurisy/chest pain Cardiovascular: negative Gastrointestinal: negative Genitourinary:negative Integument/breast: negative Hematologic/lymphatic: negative Musculoskeletal:negative Neurological: negative Behavioral/Psych: negative Endocrine: negative Allergic/Immunologic: negative  Physical Exam  KGU:RKYHC, healthy, no distress, well nourished, well developed and anxious SKIN: skin color, texture, turgor are normal, no rashes or significant lesions HEAD: Normocephalic, No masses, lesions, tenderness or abnormalities EYES: normal, PERRLA, Conjunctiva are pink and non-injected EARS: External ears normal, Canals clear OROPHARYNX:no exudate, no erythema and lips, buccal mucosa, and tongue normal  NECK: supple, no adenopathy, no JVD LYMPH:  no palpable lymphadenopathy, no hepatosplenomegaly BREAST:not examined LUNGS: clear to auscultation , and palpation HEART: regular rate & rhythm, no murmurs and no gallops ABDOMEN:abdomen soft, non-tender, normal bowel sounds and no masses or organomegaly BACK: No CVA tenderness, Range of motion is normal EXTREMITIES:no joint deformities, effusion, or inflammation, no edema  NEURO: alert & oriented x 3 with fluent speech, no focal motor/sensory deficits  PERFORMANCE STATUS: ECOG 0  LABORATORY DATA: Lab Results  Component Value Date   WBC 5.0 03/12/2020   HGB 12.1 03/12/2020   HCT 37.4 03/12/2020   MCV 89.0 03/12/2020   PLT 93 (L) 03/12/2020      Chemistry      Component Value Date/Time   NA 140 04/07/2020 1341   NA 141 04/18/2019 1357   K 4.0 04/07/2020 1341   CL 105 04/07/2020 1341   CO2 29 04/07/2020 1341   BUN 13 04/07/2020 1341   BUN 12  04/18/2019 1357   CREATININE 0.84 04/07/2020 1341      Component Value Date/Time   CALCIUM 9.4 04/07/2020 1341   ALKPHOS 51 04/07/2020 1341   AST 24 04/07/2020 1341   ALT 24 04/07/2020 1341   BILITOT 0.6 04/07/2020 1341       RADIOGRAPHIC STUDIES: DG Chest 2 View  Result Date: 03/23/2020 CLINICAL DATA:  Postop from right lower lobe wedge resection approximately 2 weeks ago. Right-sided chest pain. EXAM: CHEST - 2 VIEW COMPARISON:  03/12/2020 from [facility] FINDINGS: The heart size and mediastinal contours are within normal limits. No evidence of pneumothorax. Improved aeration of both lungs is seen. Tiny right pleural effusion is again noted. There is a focal nodular opacity in the right lung base at site of radiopaque marker, which shows mildly decreased prominence since previous study. No new or worsening areas of pulmonary opacity  are seen. IMPRESSION: Mildly decreased prominence of focal nodular opacity in the right lung base at site of radiopaque marker. Persistent tiny right pleural effusion. Electronically Signed   By: Marlaine Hind M.D.   On: 03/23/2020 13:16   DG Chest 2 View  Result Date: 03/10/2020 CLINICAL DATA:  Right lung nodule.  Pre-op respiratory exam EXAM: CHEST - 2 VIEW COMPARISON:  11/28/2019 FINDINGS: The heart size and mediastinal contours are within normal limits. A clip is again seen in the right lower lobe. Previously seen airspace disease in this region has now resolved. Both lungs are clear. The visualized skeletal structures are unremarkable. IMPRESSION: No active cardiopulmonary disease. Electronically Signed   By: Marlaine Hind M.D.   On: 03/10/2020 07:47   DG Chest Port 1 View  Result Date: 03/12/2020 CLINICAL DATA:  Post right lobectomy.  Chest tube removal. EXAM: PORTABLE CHEST 1 VIEW COMPARISON:  03/12/2020 FINDINGS: Right chest tube removed. Small right apical pneumothorax approximately 10-15 mm in thickness. Staple line in the right lung base.  Surgical clips in the right lung base. Mild bibasilar atelectasis and small bilateral effusions. Negative for heart failure. IMPRESSION: Small right apical pneumothorax post right chest tube removal. Electronically Signed   By: Franchot Gallo M.D.   On: 03/12/2020 12:09   DG Chest Port 1 View  Result Date: 03/12/2020 CLINICAL DATA:  Chest tube.  Lung nodules. EXAM: PORTABLE CHEST 1 VIEW COMPARISON:  03/11/2020 FINDINGS: Chest tube extending into the right apex is unchanged. Improvement in small right apical pneumothorax which is now difficult to identify. Right lower lobe airspace disease slightly improved. Improvement in left lower lobe atelectasis. No significant pleural effusion. IMPRESSION: Interval improvement in small right apical pneumothorax Improved aeration in the lung bases. Electronically Signed   By: Franchot Gallo M.D.   On: 03/12/2020 08:02   DG Chest Port 1 View  Result Date: 03/11/2020 CLINICAL DATA:  Right lower lobe wedge resection. EXAM: PORTABLE CHEST 1 VIEW COMPARISON:  03/09/2020 FINDINGS: Surgical staples in the right lung base with associated metal clips. Surrounding airspace disease may represent postoperative change in hemorrhage. Right chest tube in place.  Small right apical pneumothorax. Patchy airspace disease bilaterally could represent pneumonia or edema. IMPRESSION: Right lower lobectomy.  Small right apical pneumothorax Patchy bilateral airspace disease could represent edema or pneumonia. Electronically Signed   By: Franchot Gallo M.D.   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 Roxan Hockey.   PLAN: I had a lengthy discussion with the patient and her husband  today about her current condition and treatment options.  I explained to the patient that this is an indolent condition and based on her personal experience she has these nodules for more than 10 years with no concerning issues and very slow growth over that many years. I also explained to the patient that we will continue to monitor her pulmonary nodule closely with imaging studies initially every 6 months at least for the first year and then annually after that. I also explained to the patient that the current standard of care is observation and close monitoring unless she becomes symptomatic or has significant cross of the MALT lymphoma, we may consider her for treatment with  Rituxan or other treatment options including SBRT to the rapidly progressive lesion. The patient and her husband had several questions and I answered them completely to their satisfaction. I will see her back for follow-up visit in 6 months for evaluation with repeat CT scan of the chest. The patient was advised to call immediately if she has any other concerning symptoms in the interval.  The patient voices understanding of current disease status and treatment options and is in agreement with the current care plan.  All questions were answered. The patient knows to call the clinic with any problems, questions or concerns. We can certainly see the patient much sooner if necessary.  Thank you so much for allowing me to participate in the care of [place]. I will continue to follow up the patient with you and assist in her care.  The total time spent in the appointment was 60 minutes.  Disclaimer: This note was dictated with voice recognition software. Similar sounding words can inadvertently be transcribed and may not be corrected upon review.   Eilleen Kempf April 07, 2020, 2:35 PM

## 2020-04-12 ENCOUNTER — Telehealth: Payer: Self-pay | Admitting: Internal Medicine

## 2020-04-12 NOTE — Telephone Encounter (Signed)
Scheduled per los. Called and spoke with patient. Confirmed appt 

## 2020-04-14 LAB — FUNGUS CULTURE WITH STAIN

## 2020-04-14 LAB — FUNGUS CULTURE RESULT

## 2020-04-14 LAB — FUNGAL ORGANISM REFLEX

## 2020-04-15 ENCOUNTER — Encounter: Payer: Self-pay | Admitting: *Deleted

## 2020-04-15 ENCOUNTER — Telehealth: Payer: Self-pay

## 2020-04-15 NOTE — Telephone Encounter (Signed)
-----   Message from Melrose Nakayama, MD sent at 04/14/2020  4:08 PM EDT ----- Regarding: RE: 2nd opinion Contact: [phone] Put in a referral to Dr. Marcie Mowers at [facility] ----- Message ----- From: Marylen Ponto, LPN Sent: 4/38/3818   2:55 PM EDT To: Melrose Nakayama, MD Subject: 2nd opinion                                    [name] called today requesting a referral to [facility] for a 2nd opinion. She is scheduled for a post op visit with you on 05/04/20. Please advise Laura Ritter

## 2020-04-15 NOTE — Progress Notes (Signed)
I faxed molecular genetic testing to [facility] with confirmation of fax.

## 2020-04-16 ENCOUNTER — Encounter: Payer: Self-pay | Admitting: *Deleted

## 2020-04-16 DIAGNOSIS — R918 Other nonspecific abnormal finding of lung field: Secondary | ICD-10-CM

## 2020-04-16 NOTE — Progress Notes (Signed)
I received a message from [facility], Neoma Laming.  She states patient wants a second option at [place] with Dr. Sharlet Salina.  She states his PA recommendation is for her to see hem onc at [facility].  I updated Dr. Julien Nordmann. He is ok with her getting second opinion.  Referral completed.

## 2020-04-19 NOTE — Progress Notes (Signed)
55 y.o. [id number] Married White or Caucasian Not Hispanic or Latino female here for annual exam. MALT lymphoma diagnosed last month. She has a right lower lobe lung wedge resection on Feb 10. Just having it followed, no treatment needed. She has some nerve pain since the surgery and is on Gabapentin.  She had a urine done feb 8 that came back with bacteria patient asked if she should be concerned. No urinary symptoms. Patient reassured.  No vaginal bleeding. No significant vasomotor symptoms. No dyspareunia with use of vaginal estrogen. No bowel c/o.      Patient's last menstrual period was 01/12/2014 (lmp unknown).          Sexually active: Yes.    The current method of family planning is post menopausal status.    Exercising: Yes.    Cardio and strength training Smoker:  no  Health Maintenance: Pap:  04-05-16 neg, 04-12-2018 neg HPV HR neg History of abnormal Pap:  no MMG:  04/21/19 density B Bi-rads 1 neg, had it this morning.   BMD:   None  Colonoscopy: 2018 10 year f/u  TDaP:  2018  Gardasil: NA    reports that she has never smoked. She has never used smokeless tobacco. She reports current alcohol use of about 4.0 standard drinks of alcohol per week. She reports that she does not use drugs. 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 3 kids (2 boys, 1 girl): 90, 80 and 70. All in school in [place]. Oldest graduates from college this year.   Past Medical History:  Diagnosis Date  . History of kidney stones 05/2007   followed yearly follow up- Dr. Jeffie Pollock  . Leukopenia   . PVC (premature ventricular contraction) 04/01/2015   anxiety related    Past Surgical History:  Procedure Laterality Date  . BACK SURGERY  1/16   herniated disc  . BRONCHIAL BIOPSY  11/28/2019   Procedure: BRONCHIAL BIOPSIES;  Surgeon: Garner Nash, DO;  Location: [place] [facility];  Service: Pulmonary;;  . BRONCHIAL BRUSHINGS  11/28/2019   Procedure: BRONCHIAL BRUSHINGS;  Surgeon: Garner Nash, DO;   Location: [place] [facility];  Service: Pulmonary;;  . BRONCHIAL NEEDLE ASPIRATION BIOPSY  11/28/2019   Procedure: BRONCHIAL NEEDLE ASPIRATION BIOPSIES;  Surgeon: Garner Nash, DO;  Location: [place] [facility];  Service: Pulmonary;;  . BRONCHIAL WASHINGS  11/28/2019   Procedure: BRONCHIAL WASHINGS;  Surgeon: Garner Nash, DO;  Location: [place];  Service: Pulmonary;;  . CESAREAN SECTION     x3  . CHOLECYSTECTOMY  2001  . COLONOSCOPY    . EXTRACORPOREAL SHOCK WAVE LITHOTRIPSY Right 04/19/2017   Procedure: RIGHT EXTRACORPOREAL SHOCK WAVE LITHOTRIPSY (ESWL);  Surgeon: Ceasar Mons, MD;  [facility];  Service: Urology;  Laterality: Right;  . FIDUCIAL MARKER PLACEMENT  11/28/2019   Procedure: FIDUCIAL MARKER PLACEMENT;  Surgeon: Garner Nash, DO;  Location: [place] [facility];  Service: Pulmonary;;  . INTERCOSTAL NERVE BLOCK Right 03/11/2020   Procedure: INTERCOSTAL NERVE BLOCK;  Surgeon: Melrose Nakayama, MD;  Location: [place];  Service: Thoracic;  Laterality: Right;  . LAPAROSCOPIC CHOLECYSTECTOMY  2001  . LYMPH NODE DISSECTION N/A 03/11/2020   Procedure: LYMPH NODE DISSECTION;  Surgeon: Melrose Nakayama, MD;  Location: [place];  Service: Thoracic;  Laterality: N/A;  . REFRACTIVE SURGERY Bilateral 2006   lasik  . SPINE SURGERY    . VIDEO BRONCHOSCOPY WITH ENDOBRONCHIAL NAVIGATION N/A 11/28/2019   Procedure: VIDEO BRONCHOSCOPY WITH ENDOBRONCHIAL NAVIGATION;  Surgeon:  Garner Nash, DO;  Location: [place] [facility];  Service: Pulmonary;  Laterality: N/A;  . WISDOM TOOTH EXTRACTION    . XI ROBOTIC ASSISTED THORASCOPY - RIGHT LOWER LOBE WEDGE RESECTION (Right)  03/11/2020   path pending. lung nodules    Current Outpatient Medications  Medication Sig Dispense Refill  . Calcium Carbonate-Vitamin D 600-400 MG-UNIT tablet Take 1 tablet by mouth daily.    . Cholecalciferol (VITAMIN D-3) 25 MCG (1000 UT) CAPS Take 2,000 Units by mouth daily.     . Estradiol 10 MCG TABS vaginal  tablet INSERT 1 TABLET VAGINALLY  TWICE WEEKLY FOR VAGINAL  DRYNESS 24 tablet 0  . gabapentin (NEURONTIN) 300 MG capsule Take 1 capsule (300mg ) by mouth daily for 1 week then increase to 1 capsule twice daily. 40 capsule 1  . loratadine (CLARITIN) 5 MG chewable tablet Chew 5 mg by mouth daily. PRN    . melatonin 5 MG TABS Take 5 mg by mouth. PRN    . Multiple Vitamins-Minerals (MULTIVITAMIN PO) Take 1 tablet by mouth daily.     . Omega-3 Fatty Acids (FISH OIL PO) Take 1 tablet by mouth daily.      No current facility-administered medications for this visit.    Family History  Problem Relation Age of Onset  . Hyperlipidemia Mother   . Alcohol abuse Mother   . Arthritis Mother   . Alzheimer's disease Mother   . Hypertension Father   . Parkinson's disease Father   . Alcohol abuse Father   . Kidney Stones Brother   . Alcohol abuse Brother   . Migraines Son     Review of Systems  All other systems reviewed and are negative.   Exam:   BP 110/62   Pulse 70   Ht 5\' 7"  (1.702 m)   Wt 139 lb 12.8 oz (63.4 kg)   LMP 01/12/2014 (LMP Unknown)   SpO2 97%   BMI 21.90 kg/m   Weight change: @WEIGHTCHANGE @ Height:   Height: 5\' 7"  (170.2 cm)  Ht Readings from Last 3 Encounters:  04/21/20 5\' 7"  (1.702 m)  04/07/20 5\' 7"  (1.702 m)  03/23/20 5\' 7"  (1.702 m)    General appearance: alert, cooperative and appears stated age Head: Normocephalic, without obvious abnormality, atraumatic Neck: no adenopathy, supple, symmetrical, trachea midline and thyroid normal to inspection and palpation Lungs: clear to auscultation bilaterally Cardiovascular: regular rate and rhythm Breasts: normal appearance, no masses or tenderness Abdomen: soft, non-tender; non distended,  no masses,  no organomegaly Extremities: extremities normal, atraumatic, no cyanosis or edema Skin: Skin color, texture, turgor normal. No rashes or lesions Lymph nodes: Cervical, supraclavicular, and axillary nodes normal. No  abnormal inguinal nodes palpated Neurologic: Grossly normal   Pelvic: External genitalia:  no lesions              Urethra:  normal appearing urethra with no masses, tenderness or lesions              Bartholins and Skenes: normal                 Vagina: normal appearing vagina with normal color and discharge, no lesions              Cervix: no lesions               Bimanual Exam:  Uterus:  normal size, contour, position, consistency, mobility, non-tender              Adnexa: no mass, fullness,  tenderness               Rectovaginal: Confirms               Anus:  normal sphincter tone, no lesions   1. Well woman exam Discussed breast self exam Discussed calcium and vit D intake Mammogram today Colonoscopy UTD No pap this year  2. Vaginal atrophy Controlled with vaginal estrogen - Estradiol 10 MCG TABS vaginal tablet; INSERT 1 TABLET VAGINALLY  TWICE WEEKLY FOR VAGINAL  DRYNESS  Dispense: 24 tablet; Refill: 3

## 2020-04-21 ENCOUNTER — Other Ambulatory Visit: Payer: Self-pay

## 2020-04-21 ENCOUNTER — Encounter: Payer: Self-pay | Admitting: Obstetrics and Gynecology

## 2020-04-21 ENCOUNTER — Ambulatory Visit (INDEPENDENT_AMBULATORY_CARE_PROVIDER_SITE_OTHER): Payer: No Typology Code available for payment source

## 2020-04-21 ENCOUNTER — Ambulatory Visit: Payer: No Typology Code available for payment source | Admitting: Obstetrics and Gynecology

## 2020-04-21 VITALS — BP 110/62 | HR 70 | Ht 67.0 in | Wt 139.8 lb

## 2020-04-21 DIAGNOSIS — N952 Postmenopausal atrophic vaginitis: Secondary | ICD-10-CM

## 2020-04-21 DIAGNOSIS — Z1231 Encounter for screening mammogram for malignant neoplasm of breast: Secondary | ICD-10-CM

## 2020-04-21 DIAGNOSIS — C884 Extranodal marginal zone b-cell lymphoma of mucosa-associated lymphoid tissue (malt-lymphoma) not having achieved remission: Secondary | ICD-10-CM | POA: Insufficient documentation

## 2020-04-21 DIAGNOSIS — Z01419 Encounter for gynecological examination (general) (routine) without abnormal findings: Secondary | ICD-10-CM | POA: Diagnosis not present

## 2020-04-21 HISTORY — DX: Extranodal marginal zone b-cell lymphoma of mucosa-associated lymphoid tissue (malt-lymphoma) not having achieved remission: C88.40

## 2020-04-21 MED ORDER — ESTRADIOL 10 MCG VA TABS: ORAL_TABLET | VAGINAL | 3 refills | Status: DC

## 2020-04-21 NOTE — Patient Instructions (Signed)

## 2020-04-26 LAB — ACID FAST CULTURE WITH REFLEXED SENSITIVITIES (MYCOBACTERIA): Acid Fast Culture: NEGATIVE

## 2020-05-03 ENCOUNTER — Other Ambulatory Visit: Payer: Self-pay | Admitting: Thoracic Surgery (Cardiothoracic Vascular Surgery)

## 2020-05-03 DIAGNOSIS — R918 Other nonspecific abnormal finding of lung field: Secondary | ICD-10-CM

## 2020-05-04 ENCOUNTER — Ambulatory Visit (INDEPENDENT_AMBULATORY_CARE_PROVIDER_SITE_OTHER): Payer: Self-pay | Admitting: Thoracic Surgery (Cardiothoracic Vascular Surgery)

## 2020-05-04 ENCOUNTER — Other Ambulatory Visit: Payer: Self-pay

## 2020-05-04 ENCOUNTER — Ambulatory Visit
Admission: RE | Admit: 2020-05-04 | Discharge: 2020-05-04 | Disposition: A | Discharge status: Home / Self Care | Payer: No Typology Code available for payment source | Source: Ambulatory Visit | Attending: Thoracic Surgery (Cardiothoracic Vascular Surgery) | Admitting: Thoracic Surgery (Cardiothoracic Vascular Surgery)

## 2020-05-04 VITALS — BP 108/68 | HR 68 | Resp 20 | Ht 67.0 in | Wt 140.0 lb

## 2020-05-04 DIAGNOSIS — R911 Solitary pulmonary nodule: Secondary | ICD-10-CM

## 2020-05-04 DIAGNOSIS — Z9889 Other specified postprocedural states: Secondary | ICD-10-CM

## 2020-05-04 DIAGNOSIS — R918 Other nonspecific abnormal finding of lung field: Secondary | ICD-10-CM

## 2020-05-04 NOTE — Progress Notes (Signed)
[address]       [place] [address]             [phone]      HPI: Laura Ritter returns for a scheduled postoperative follow-up visit  Laura Ritter is a 55 year old woman with a history of kidney stones, arthritis, leukopenia, PVCs, and lung nodules.  She was first noted to have a lung nodule in 2010.  She was followed after that and the nodules waxed and waned.  In 2017 there was a PET which showed some mild metabolic activity.  More recently there was an increase in size of a groundglass opacity in the right lower lobe.  She had a navigational bronchoscopy that was nondiagnostic.  I did a robotic wedge resection on 03/11/2020.  The nodule turned out to be a MALT lymphoma.  She did well initially postoperatively.  She came back for follow-up visit was having some neuropathic pain.  We started on gabapentin for that.  She saw Dr. Julien Nordmann.  He recommended observation.  She is planning to follow-up with him but did want to get a second opinion at [facility].  She has had contact from them but does not have an appointment yet.  In the interim since her last visit her pain has resolved.  She weaned herself off of gabapentin.  She is not having to take anything for pain.  She has not had any respiratory issues.  Past Medical History:  Diagnosis Date  . History of kidney stones 05/2007   followed yearly follow up- Dr. Jeffie Pollock  . Leukopenia   . PVC (premature ventricular contraction) 04/01/2015   anxiety related    Current Outpatient Medications  Medication Sig Dispense Refill  . Calcium Carbonate-Vitamin D 600-400 MG-UNIT tablet Take 1 tablet by mouth daily.    . Cholecalciferol (VITAMIN D-3) 25 MCG (1000 UT) CAPS Take 2,000 Units by mouth daily.     Marland Kitchen COVID-19 mRNA vaccine, Pfizer, 30 MCG/0.3ML injection INJECT AS DIRECTED .3 mL 0  . Estradiol 10 MCG TABS vaginal tablet INSERT 1 TABLET VAGINALLY  TWICE WEEKLY FOR VAGINAL  DRYNESS 24 tablet 3  . gabapentin (NEURONTIN)  300 MG capsule Take 1 capsule (300mg ) by mouth daily for 1 week then increase to 1 capsule twice daily. 40 capsule 1  . loratadine (CLARITIN) 5 MG chewable tablet Chew 5 mg by mouth daily. PRN    . melatonin 5 MG TABS Take 5 mg by mouth. PRN    . Multiple Vitamins-Minerals (MULTIVITAMIN PO) Take 1 tablet by mouth daily.     . Omega-3 Fatty Acids (FISH OIL PO) Take 1 tablet by mouth daily.      No current facility-administered medications for this visit.    Physical Exam BP 108/68   Pulse 68   Resp 20   Ht 5\' 7"  (1.702 m)   Wt 140 lb (63.5 kg)   LMP 01/12/2014 (LMP Unknown)   SpO2 100% Comment: RA  BMI 21.93 kg/m  Well-appearing 56 year old woman in no acute distress Alert and oriented x3 with no focal deficits Lungs clear with equal breath sounds bilaterally Incisions well-healed  Diagnostic Tests: CHEST - 2 VIEW  COMPARISON:  March 23, 2020.  FINDINGS: Apparent nipple shadows bilaterally. Postoperative change noted right base with ill-defined opacity in the right base region, slightly smaller compared to previous study. Lungs elsewhere clear. Heart size and pulmonary vascularity are normal. No adenopathy. No bone lesions. No pneumothorax.  IMPRESSION: Apparent nipple shadows  bilaterally. Scarring and postoperative change right base. Opacity in the right base slightly less prominent than on previous study. No new opacity evident on either side. Heart size normal. No adenopathy evident.   Electronically Signed   By: Lowella Grip III M.D.   On: 05/04/2020 09:58 I personally reviewed the chest x-ray images and concur with the findings noted above  Impression: Laura Ritter is a 55 year old woman who has had a history of waxing and waning lung nodules dating back to 2010.  She is a lifelong non-smoker.  Recently she had a nodule that is increased in size.  I did a robotic assisted wedge resection about 2 months ago.  The nodule turned out to be a MALT  lymphoma.  She does have multiple other nodules.  From a surgical standpoint she is doing well.  She is no longer taking any pain medication.  There are no restrictions on her activities.  She saw Dr. Julien Nordmann and is planning to follow-up with him with a CT scan in 6 months.  She is waiting to hear from [facility] about getting a second opinion.  Plan: Follow-up with Dr. Julien Nordmann as scheduled Call if there are any issues I would be happy to see Laura Ritter back anytime in the future if I can be of any further assistance with her care  Melrose Nakayama, MD [facility] [phone]

## 2020-05-18 ENCOUNTER — Ambulatory Visit: Payer: No Typology Code available for payment source | Attending: Internal Medicine

## 2020-05-18 DIAGNOSIS — Z23 Encounter for immunization: Secondary | ICD-10-CM

## 2020-05-18 NOTE — Progress Notes (Signed)
   Covid-19 Vaccination Clinic  Name:  Laura Ritter    MRN: [id number] DOB: 1965-06-16  05/18/2020  Laura Ritter was observed post Covid-19 immunization for 15 minutes without incident. She was provided with Vaccine Information Sheet and instruction to access the V-Safe system.   Laura Ritter was instructed to call 911 with any severe reactions post vaccine: Marland Kitchen Difficulty breathing  . Swelling of face and throat  . A fast heartbeat  . A bad rash all over body  . Dizziness and weakness   Immunizations Administered    Name Date Dose VIS Date Route   PFIZER Comrnaty(Gray TOP) Covid-19 Vaccine 05/18/2020  1:58 PM 0.3 mL 01/08/2020 Intramuscular   Manufacturer: [employer]   Lot: [id number]   NDC: [phone]

## 2020-05-21 ENCOUNTER — Other Ambulatory Visit (HOSPITAL_BASED_OUTPATIENT_CLINIC_OR_DEPARTMENT_OTHER): Payer: Self-pay

## 2020-05-21 MED ORDER — PFIZER-BIONT COVID-19 VAC-TRIS 30 MCG/0.3ML IM SUSP
INTRAMUSCULAR | 0 refills | Status: DC
  Filled 2020-05-21: qty 0.3, 1d supply, fill #0

## 2020-09-27 ENCOUNTER — Other Ambulatory Visit: Payer: Self-pay

## 2020-09-27 ENCOUNTER — Telehealth: Payer: Self-pay

## 2020-09-27 DIAGNOSIS — C884 Extranodal marginal zone B-cell lymphoma of mucosa-associated lymphoid tissue [MALT-lymphoma]: Secondary | ICD-10-CM

## 2020-09-27 NOTE — Telephone Encounter (Signed)
Pt called wanting to know if she can do a chest xray instead of a CT?  Pt states her provider at [facility] felt a xray should be sufficient and prefers this but wants Dr. Worthy Flank opinion.

## 2020-09-27 NOTE — Telephone Encounter (Signed)
I have spoken with the pt and advised, per Dr. Julien Nordmann, okay to have chest xray. Order has been entered.

## 2020-10-07 ENCOUNTER — Other Ambulatory Visit: Payer: Self-pay

## 2020-10-07 ENCOUNTER — Inpatient Hospital Stay: Payer: No Typology Code available for payment source | Attending: Internal Medicine

## 2020-10-07 ENCOUNTER — Ambulatory Visit (HOSPITAL_COMMUNITY)
Admission: RE | Admit: 2020-10-07 | Discharge: 2020-10-07 | Disposition: A | Discharge status: Home / Self Care | Payer: No Typology Code available for payment source | Source: Ambulatory Visit | Attending: Internal Medicine | Admitting: Internal Medicine

## 2020-10-07 DIAGNOSIS — C884 Extranodal marginal zone B-cell lymphoma of mucosa-associated lymphoid tissue [MALT-lymphoma]: Secondary | ICD-10-CM | POA: Insufficient documentation

## 2020-10-07 DIAGNOSIS — D72819 Decreased white blood cell count, unspecified: Secondary | ICD-10-CM | POA: Diagnosis not present

## 2020-10-07 DIAGNOSIS — Z79899 Other long term (current) drug therapy: Secondary | ICD-10-CM | POA: Insufficient documentation

## 2020-10-07 DIAGNOSIS — Z87442 Personal history of urinary calculi: Secondary | ICD-10-CM | POA: Diagnosis not present

## 2020-10-07 LAB — CBC WITH DIFFERENTIAL (CANCER CENTER ONLY)
Abs Immature Granulocytes: 0 10*3/uL (ref 0.00–0.07)
Basophils Absolute: 0 10*3/uL (ref 0.0–0.1)
Basophils Relative: 1 %
Eosinophils Absolute: 0.1 10*3/uL (ref 0.0–0.5)
Eosinophils Relative: 3 %
HCT: 41.8 % (ref 36.0–46.0)
Hemoglobin: 14.1 g/dL (ref 12.0–15.0)
Immature Granulocytes: 0 %
Lymphocytes Relative: 30 %
Lymphs Abs: 0.8 10*3/uL (ref 0.7–4.0)
MCH: 29.6 pg (ref 26.0–34.0)
MCHC: 33.7 g/dL (ref 30.0–36.0)
MCV: 87.6 fL (ref 80.0–100.0)
Monocytes Absolute: 0.3 10*3/uL (ref 0.1–1.0)
Monocytes Relative: 9 %
Neutro Abs: 1.6 10*3/uL — ABNORMAL LOW (ref 1.7–7.7)
Neutrophils Relative %: 57 %
Platelet Count: 164 10*3/uL (ref 150–400)
RBC: 4.77 MIL/uL (ref 3.87–5.11)
RDW: 12.9 % (ref 11.5–15.5)
WBC Count: 2.8 10*3/uL — ABNORMAL LOW (ref 4.0–10.5)
nRBC: 0 % (ref 0.0–0.2)

## 2020-10-07 LAB — CMP (CANCER CENTER ONLY)
ALT: 18 U/L (ref 0–44)
AST: 21 U/L (ref 15–41)
Albumin: 4.4 g/dL (ref 3.5–5.0)
Alkaline Phosphatase: 51 U/L (ref 38–126)
Anion gap: 9 (ref 5–15)
BUN: 15 mg/dL (ref 6–20)
CO2: 28 mmol/L (ref 22–32)
Calcium: 9.3 mg/dL (ref 8.9–10.3)
Chloride: 103 mmol/L (ref 98–111)
Creatinine: 0.87 mg/dL (ref 0.44–1.00)
GFR, Estimated: >60 mL/min (ref >60)
Glucose, Bld: 80 mg/dL (ref 70–99)
Potassium: 3.9 mmol/L (ref 3.5–5.1)
Sodium: 140 mmol/L (ref 135–145)
Total Bilirubin: 0.8 mg/dL (ref 0.3–1.2)
Total Protein: 7 g/dL (ref 6.5–8.1)

## 2020-10-07 LAB — LACTATE DEHYDROGENASE: LDH: 175 U/L (ref 98–192)

## 2020-10-08 ENCOUNTER — Other Ambulatory Visit: Payer: No Typology Code available for payment source

## 2020-10-11 ENCOUNTER — Other Ambulatory Visit: Payer: Self-pay

## 2020-10-11 ENCOUNTER — Inpatient Hospital Stay: Payer: No Typology Code available for payment source | Admitting: Internal Medicine

## 2020-10-11 VITALS — BP 114/71 | HR 60 | Temp 96.9°F | Resp 18 | Ht 67.0 in | Wt 137.5 lb

## 2020-10-11 DIAGNOSIS — C884 Extranodal marginal zone B-cell lymphoma of mucosa-associated lymphoid tissue [MALT-lymphoma]: Secondary | ICD-10-CM | POA: Diagnosis not present

## 2020-10-11 NOTE — Progress Notes (Signed)
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 Roxan Hockey.  CURRENT THERAPY: Observation  INTERVAL HISTORY: Laura Ritter 55 y.o. female returns to the clinic today for follow-up visit.  The patient is feeling fine today with no concerning complaints.  She denied having any current chest pain, shortness of breath, cough or hemoptysis.  She denied having any fever or chills.  She has no nausea, vomiting, diarrhea or constipation.  She has no headache or visual changes.  The patient is here today for evaluation with repeat blood work and chest x-ray.  MEDICAL HISTORY: Past Medical History:  Diagnosis Date   History of kidney stones 05/2007   followed yearly follow up- Dr. Jeffie Pollock   Leukopenia    PVC (premature ventricular contraction) 04/01/2015   anxiety related    ALLERGIES:  has No Known Allergies.  MEDICATIONS:  Current Outpatient Medications  Medication Sig Dispense Refill   Calcium Carbonate-Vitamin D 600-400 MG-UNIT tablet Take 1 tablet by mouth daily.     Cholecalciferol (VITAMIN D-3) 25 MCG (1000 UT) CAPS Take 2,000 Units by mouth daily.      COVID-19 mRNA Vac-TriS, Pfizer, (PFIZER-BIONT COVID-19 VAC-TRIS) SUSP injection Inject into the muscle. 0.3 mL 0   COVID-19 mRNA vaccine, Pfizer, 30 MCG/0.3ML injection INJECT AS DIRECTED .3 mL 0   Estradiol 10 MCG TABS vaginal tablet INSERT 1 TABLET VAGINALLY  TWICE WEEKLY FOR VAGINAL  DRYNESS 24 tablet 3   gabapentin (NEURONTIN) 300 MG capsule Take 1 capsule ('300mg'$ ) by mouth daily for 1 week then increase to 1 capsule twice daily. 40 capsule 1   loratadine  (CLARITIN) 5 MG chewable tablet Chew 5 mg by mouth daily. PRN     melatonin 5 MG TABS Take 5 mg by mouth. PRN     Multiple Vitamins-Minerals (MULTIVITAMIN PO) Take 1 tablet by mouth daily.      Omega-3 Fatty Acids (FISH OIL PO) Take 1 tablet by mouth daily.      No current facility-administered medications for this visit.    SURGICAL HISTORY:  Past Surgical History:  Procedure Laterality Date   BACK SURGERY  1/16   herniated disc   BRONCHIAL BIOPSY  11/28/2019   Procedure: BRONCHIAL BIOPSIES;  Surgeon: Garner Nash, DO;  Location: [facility];  Service: Pulmonary;;   BRONCHIAL BRUSHINGS  11/28/2019   Procedure: BRONCHIAL BRUSHINGS;  Surgeon: Garner Nash, DO;  Location: [facility];  Service: Pulmonary;;   BRONCHIAL NEEDLE ASPIRATION BIOPSY  11/28/2019   Procedure: BRONCHIAL NEEDLE ASPIRATION BIOPSIES;  Surgeon: Garner Nash, DO;  Location: [facility];  Service: Pulmonary;;   BRONCHIAL WASHINGS  11/28/2019   Procedure: BRONCHIAL WASHINGS;  Surgeon: Garner Nash, DO;  Location: [facility];  Service: Pulmonary;;   CESAREAN SECTION     x3   CHOLECYSTECTOMY  2001   COLONOSCOPY     EXTRACORPOREAL SHOCK WAVE LITHOTRIPSY Right 04/19/2017   Procedure: RIGHT EXTRACORPOREAL SHOCK WAVE LITHOTRIPSY (ESWL);  Surgeon: Ceasar Mons, MD;  Location: [facility];  Service: Urology;  Laterality: Right;   FIDUCIAL MARKER PLACEMENT  11/28/2019   Procedure: FIDUCIAL MARKER PLACEMENT;  Surgeon: Valeta Harms,  Octavio Graves, DO;  Location: [facility];  Service: Pulmonary;;   INTERCOSTAL NERVE BLOCK Right 03/11/2020   Procedure: INTERCOSTAL NERVE BLOCK;  Surgeon: Melrose Nakayama, MD;  Location: [facility];  Service: Thoracic;  Laterality: Right;   [place]   LYMPH NODE DISSECTION N/A 03/11/2020   Procedure: LYMPH NODE DISSECTION;  Surgeon: Melrose Nakayama, MD;  Location: [facility];  Service: Thoracic;  Laterality: N/A;   REFRACTIVE SURGERY Bilateral 2006    lasik   SPINE SURGERY     VIDEO BRONCHOSCOPY WITH ENDOBRONCHIAL NAVIGATION N/A 11/28/2019   Procedure: VIDEO BRONCHOSCOPY WITH ENDOBRONCHIAL NAVIGATION;  Surgeon: Garner Nash, DO;  Location: [facility];  Service: Pulmonary;  Laterality: N/A;   WISDOM TOOTH EXTRACTION     XI ROBOTIC ASSISTED THORASCOPY - RIGHT LOWER LOBE WEDGE RESECTION (Right)  03/11/2020   path pending. lung nodules    REVIEW OF SYSTEMS:  A comprehensive review of systems was negative.   PHYSICAL EXAMINATION: General appearance: alert, cooperative, and no distress Head: Normocephalic, without obvious abnormality, atraumatic Neck: no adenopathy, no JVD, supple, symmetrical, trachea midline, and thyroid not enlarged, symmetric, no tenderness/mass/nodules Lymph nodes: Cervical, supraclavicular, and axillary nodes normal. Resp: clear to auscultation bilaterally Back: symmetric, no curvature. ROM normal. No CVA tenderness. Cardio: regular rate and rhythm, S1, S2 normal, no murmur, click, rub or gallop GI: soft, non-tender; bowel sounds normal; no masses,  no organomegaly Extremities: extremities normal, atraumatic, no cyanosis or edema  ECOG PERFORMANCE STATUS: 0 - Asymptomatic  Blood pressure 114/71, pulse 60, temperature (!) 96.9 F (36.1 C), temperature source Tympanic, resp. rate 18, height '5\' 7"'$  (1.702 m), weight 137 lb 8 oz (62.4 kg), last menstrual period 01/12/2014, SpO2 99 %.  LABORATORY DATA: Lab Results  Component Value Date   WBC 2.8 (L) 10/07/2020   HGB 14.1 10/07/2020   HCT 41.8 10/07/2020   MCV 87.6 10/07/2020   PLT 164 10/07/2020      Chemistry      Component Value Date/Time   NA 140 10/07/2020 0914   NA 141 04/18/2019 1357   K 3.9 10/07/2020 0914   CL 103 10/07/2020 0914   CO2 28 10/07/2020 0914   BUN 15 10/07/2020 0914   BUN 12 04/18/2019 1357   CREATININE 0.87 10/07/2020 0914      Component Value Date/Time   CALCIUM 9.3 10/07/2020 0914   ALKPHOS 51 10/07/2020 0914   AST 21  10/07/2020 0914   ALT 18 10/07/2020 0914   BILITOT 0.8 10/07/2020 0914       RADIOGRAPHIC STUDIES: DG Chest 2 View  Result Date: 10/07/2020 CLINICAL DATA:  55 year old female with a history of MALT lymphoma EXAM: CHEST - 2 VIEW COMPARISON:  05/04/2020 FINDINGS: Cardiomediastinal silhouette unchanged in size and contour. Fiducial clip within the posterior right lung base, unchanged. Surgical suture lines at this site. No pneumothorax or pleural effusion. No confluent airspace disease. No interlobular septal thickening. Surgical changes of mastectomy/reconstruction. Mild degenerative changes of the spine. IMPRESSION: Negative for acute cardiopulmonary disease Electronically Signed   By: Corrie Mckusick D.O.   On: 10/07/2020 16:02    ASSESSMENT AND PLAN: This is a very pleasant 55 years old white female with extranodal marginal zone lymphoma of mucosa associated lymphoid tissue (MALT lymphoma) status post multiple wedge resection of the right lower lobe on March 11, 2020 under the care of Dr. Roxan Hockey. The patient is currently on observation and she is feeling fine today with no concerning complaints. Her blood  count showed mild leukocytopenia of unclear etiology but we will continue to monitor it closely. The chest x-ray showed no evidence for recurrence. I recommended for the patient to continue on observation with repeat chest x-ray and blood work in 1 year. She was advised to call immediately if she has any other concerning symptoms in the interval. The patient voices understanding of current disease status and treatment options and is in agreement with the current care plan.  All questions were answered. The patient knows to call the clinic with any problems, questions or concerns. We can certainly see the patient much sooner if necessary.  The total time spent in the appointment was 15 minutes.  Disclaimer: This note was dictated with voice recognition software. Similar sounding words  can inadvertently be transcribed and may not be corrected upon review.

## 2020-10-11 NOTE — Addendum Note (Signed)
Addended by: Ardeen Garland on: 10/11/2020 10:14 AM   Modules accepted: Orders

## 2020-11-01 ENCOUNTER — Telehealth: Payer: Self-pay | Admitting: Internal Medicine

## 2020-11-01 NOTE — Telephone Encounter (Signed)
Sch per 9/12 los, pt aware 

## 2020-12-30 ENCOUNTER — Inpatient Hospital Stay: Admission: RE | Admit: 2020-12-30 | Payer: No Typology Code available for payment source | Source: Ambulatory Visit

## 2021-01-03 ENCOUNTER — Other Ambulatory Visit: Payer: Self-pay

## 2021-01-03 ENCOUNTER — Ambulatory Visit: Payer: No Typology Code available for payment source

## 2021-02-15 ENCOUNTER — Encounter: Payer: Self-pay | Admitting: Obstetrics and Gynecology

## 2021-02-15 DIAGNOSIS — N952 Postmenopausal atrophic vaginitis: Secondary | ICD-10-CM

## 2021-02-17 MED ORDER — ESTRADIOL 0.1 MG/GM VA CREA: TOPICAL_CREAM | VAGINAL | 0 refills | Status: DC

## 2021-02-18 ENCOUNTER — Other Ambulatory Visit: Payer: Self-pay

## 2021-02-21 ENCOUNTER — Ambulatory Visit (INDEPENDENT_AMBULATORY_CARE_PROVIDER_SITE_OTHER): Payer: No Typology Code available for payment source | Admitting: Family Medicine

## 2021-02-21 ENCOUNTER — Telehealth: Payer: Self-pay

## 2021-02-21 ENCOUNTER — Encounter: Payer: Self-pay | Admitting: Family Medicine

## 2021-02-21 ENCOUNTER — Other Ambulatory Visit: Payer: Self-pay

## 2021-02-21 VITALS — BP 98/61 | HR 59 | Temp 98.0°F | Ht 67.0 in | Wt 133.0 lb

## 2021-02-21 DIAGNOSIS — Z0000 Encounter for general adult medical examination without abnormal findings: Secondary | ICD-10-CM

## 2021-02-21 DIAGNOSIS — Z131 Encounter for screening for diabetes mellitus: Secondary | ICD-10-CM | POA: Diagnosis not present

## 2021-02-21 DIAGNOSIS — Z13220 Encounter for screening for lipoid disorders: Secondary | ICD-10-CM

## 2021-02-21 DIAGNOSIS — C884 Extranodal marginal zone B-cell lymphoma of mucosa-associated lymphoid tissue [MALT-lymphoma]: Secondary | ICD-10-CM

## 2021-02-21 DIAGNOSIS — D72819 Decreased white blood cell count, unspecified: Secondary | ICD-10-CM

## 2021-02-21 DIAGNOSIS — E559 Vitamin D deficiency, unspecified: Secondary | ICD-10-CM

## 2021-02-21 NOTE — Telephone Encounter (Signed)
[facility] sent fax because their records showed patient with Estradiol 10 mcg vag tabs and Estrace cream. They were inquiring if she is using both.  I faxed the sheet back to them with a note that on 02/15/21 they contacted patient that Estradiol vag tabs out of stock and she needed to get different Rx. She is only using Estrace cream currently.

## 2021-02-21 NOTE — Progress Notes (Signed)
Patient ID: Laura Ritter, female  DOB: 1965-02-11, 56 y.o.   MRN: [id number] Patient Care Team    Relationship Specialty Notifications Start End  Ma Hillock, DO PCP - General Family Medicine  02/21/21   Irine Seal, MD Attending Physician Urology  01/14/16   Skeet Latch, MD Attending Physician Cardiology  01/14/16    Comment: palpitations  Marshell Garfinkel, MD Consulting Physician Pulmonary Disease  01/14/16    Comment: nodules  Juanita Craver, MD Consulting Physician Gastroenterology  12/12/16   Gerda Diss, DO Consulting Physician Sports Medicine  02/01/18   Nunzio Cobbs, MD Consulting Physician Obstetrics and Gynecology  07/03/19   Garner Nash, DO Consulting Physician Pulmonary Disease  03/24/20   Melrose Nakayama, MD Consulting Physician Cardiothoracic Surgery  03/24/20   Curt Bears, MD Consulting Physician Oncology  02/21/21   Renelda Loma, OD  Optometry  02/21/21     Chief Complaint  Patient presents with   Establish Care   Annual Exam    Pt is not fasting    Subjective:  Laura Ritter is a 56 y.o.  female present for new patient establishment. All past medical history, surgical history, allergies, family history, immunizations, medications and social history were updated in the electronic medical record today. All recent labs, ED visits and hospitalizations within the last year were reviewed.  Health maintenance:  Colonoscopy: No fhx. completed 2018, by Dr. Collene Mares- 10 yr follow up Mammogram: No fhx. 03/2021. completed:gyn Cervical cancer screening: last pap: , results: 04/12/2018, completed by: gyn Immunizations: tdap UTD 2018, Influenza UTD 09/2020 (encouraged yearly),  zostavax #12/8 (nurse visit for #2 btw 60d and 6 mos) Infectious disease screening: HIV and Hep C completed DEXA: routine screen at 60 Assistive device: none Oxygen AGT:XMIWOEHO Patient has a Dental home. Hospitalizations/ED visits:  Depression screen [facility] 2/9  02/21/2021 09/04/2016  Decreased Interest 0 0  Down, Depressed, Hopeless 0 0  PHQ - 2 Score 0 0   No flowsheet data found.      No flowsheet data found.  Immunization History  Administered Date(s) Administered   Influenza,inj,Quad PF,6+ Mos 10/31/2014, 11/01/2015, 10/27/2016   Influenza-Unspecified 11/19/2017, 11/13/2018, 11/13/2019, 10/25/2020   PFIZER Comirnaty(Gray Top)Covid-19 Tri-Sucrose Vaccine 05/18/2020   PFIZER(Purple Top)SARS-COV-2 Vaccination 11/17/2019   Tdap 01/30/2005, 04/05/2016   Zoster Recombinat (Shingrix) 01/06/2021    No results found.  Past Medical History:  Diagnosis Date   Chicken pox    Chronic kidney disease 4/09   Kidney stones   History of kidney stones 05/2007   followed yearly follow up- Dr. Jeffie Pollock   Leukopenia    Lymphoma [facility])    Migraine    PVC (premature ventricular contraction) 04/01/2015   anxiety related   No Known Allergies Past Surgical History:  Procedure Laterality Date   BRONCHIAL BIOPSY  11/28/2019   Procedure: BRONCHIAL BIOPSIES;  Surgeon: Garner Nash, DO;  Location: [place] [facility];  Service: Pulmonary;;   BRONCHIAL BRUSHINGS  11/28/2019   Procedure: BRONCHIAL BRUSHINGS;  Surgeon: Garner Nash, DO;  Location: [place] [facility];  Service: Pulmonary;;   BRONCHIAL NEEDLE ASPIRATION BIOPSY  11/28/2019   Procedure: BRONCHIAL NEEDLE ASPIRATION BIOPSIES;  Surgeon: Garner Nash, DO;  Location: [place] [facility];  Service: Pulmonary;;   BRONCHIAL WASHINGS  11/28/2019   Procedure: BRONCHIAL WASHINGS;  Surgeon: Garner Nash, DO;  Location: [place] [facility];  Service: Pulmonary;;   CESAREAN SECTION     x3   COLONOSCOPY  EXTRACORPOREAL SHOCK WAVE LITHOTRIPSY Right 04/19/2017   Procedure: RIGHT EXTRACORPOREAL SHOCK WAVE LITHOTRIPSY (ESWL);  Surgeon: Ceasar Mons, MD;  Location: [facility];  Service: Urology;  Laterality: Right;   EYE SURGERY  March 2006   Lasix   FIDUCIAL MARKER PLACEMENT  11/28/2019   Procedure: FIDUCIAL  MARKER PLACEMENT;  Surgeon: Garner Nash, DO;  Location: [place] [facility];  Service: Pulmonary;;   INTERCOSTAL NERVE BLOCK Right 03/11/2020   Procedure: INTERCOSTAL NERVE BLOCK;  Surgeon: Melrose Nakayama, MD;  Location: [facility] OR;  Service: Thoracic;  Laterality: Right;   LAPAROSCOPIC CHOLECYSTECTOMY  2001   LUMBAR LAMINECTOMY  01/2014   herniated disc   LYMPH NODE DISSECTION N/A 03/11/2020   Procedure: LYMPH NODE DISSECTION;  Surgeon: Melrose Nakayama, MD;  Location: [place];  Service: Thoracic;  Laterality: N/A;   REFRACTIVE SURGERY Bilateral 2006   lasik   SPINE SURGERY     VIDEO BRONCHOSCOPY WITH ENDOBRONCHIAL NAVIGATION N/A 11/28/2019   Procedure: VIDEO BRONCHOSCOPY WITH ENDOBRONCHIAL NAVIGATION;  Surgeon: Garner Nash, DO;  Location: [place];  Service: Pulmonary;  Laterality: N/A;   WISDOM TOOTH EXTRACTION     XI ROBOTIC ASSISTED THORASCOPY - RIGHT LOWER LOBE WEDGE RESECTION (Right)  03/11/2020   path pending. lung nodules   Family History  Problem Relation Age of Onset   Hyperlipidemia Mother    Alcohol abuse Mother    Arthritis Mother    Alzheimer's disease Mother    Hypertension Father    Parkinson's disease Father    Alcohol abuse Father    Kidney Stones Brother    Alcohol abuse Brother    Dementia Maternal Grandmother    Heart disease Paternal Grandfather    Parkinson's disease Paternal Grandfather    Social History   Social History Narrative   Marital status/children/pets: Married G3P3   Education/employment: Masters degree.  Works as a Manufacturing systems engineer:      -smoke alarm in the home:Yes     - wears seatbelt: Yes     - Feels safe in their relationships: Yes       Allergies as of 02/21/2021   No Known Allergies      Medication List        Accurate as of February 21, 2021  1:57 PM. If you have any questions, ask your nurse or doctor.          STOP taking these medications    Pfizer-BioNT COVID-19 Vac-TriS Susp  injection Generic drug: COVID-19 mRNA Vac-TriS Therapist, music) Stopped by: Howard Pouch, DO   Vitamin D-3 25 MCG (1000 UT) Caps Stopped by: Howard Pouch, DO       TAKE these medications    Calcium Carbonate-Vitamin D 600-400 MG-UNIT tablet Take 1 tablet by mouth daily.   Estradiol 10 MCG Tabs vaginal tablet INSERT 1 TABLET VAGINALLY  TWICE WEEKLY FOR VAGINAL  DRYNESS   estradiol 0.1 MG/GM vaginal cream Commonly known as: ESTRACE 1 gram vaginally twice weekly   FISH OIL PO Take 1 tablet by mouth daily.   loratadine 5 MG chewable tablet Commonly known as: CLARITIN Chew 5 mg by mouth daily. PRN   melatonin 5 MG Tabs Take 5 mg by mouth. PRN   MULTIVITAMIN PO Take 1 tablet by mouth daily.        All past medical history, surgical history, allergies, family history, immunizations andmedications were updated in the EMR today and reviewed under the history and medication portions of their EMR.  No results found for this or any previous visit (from the past 2160 hour(s)).  DG Chest 2 View  Result Date: 10/07/2020 CLINICAL DATA:  56 year old female with a history of MALT lymphoma EXAM: CHEST - 2 VIEW COMPARISON:  05/04/2020 FINDINGS: Cardiomediastinal silhouette unchanged in size and contour. Fiducial clip within the posterior right lung base, unchanged. Surgical suture lines at this site. No pneumothorax or pleural effusion. No confluent airspace disease. No interlobular septal thickening. Surgical changes of mastectomy/reconstruction. Mild degenerative changes of the spine. IMPRESSION: Negative for acute cardiopulmonary disease Electronically Signed   By: Corrie Mckusick D.O.   On: 10/07/2020 16:02     ROS 14 pt review of systems performed and negative (unless mentioned in an HPI)  Objective:  BP 98/61    Pulse (!) 59    Temp 98 F (36.7 C) (Oral)    Ht 5\' 7"  (1.702 m)    Wt 133 lb (60.3 kg)    LMP 01/12/2014 (LMP Unknown)    SpO2 99%    BMI 20.83 kg/m  Physical Exam Vitals  and nursing note reviewed.  Constitutional:      General: She is not in acute distress.    Appearance: Normal appearance. She is not ill-appearing or toxic-appearing.  HENT:     Head: Normocephalic and atraumatic.     Right Ear: Tympanic membrane, ear canal and external ear normal. There is no impacted cerumen.     Left Ear: Tympanic membrane, ear canal and external ear normal. There is no impacted cerumen.     Nose: No congestion or rhinorrhea.     Mouth/Throat:     Mouth: Mucous membranes are moist.     Pharynx: Oropharynx is clear. No oropharyngeal exudate or posterior oropharyngeal erythema.  Eyes:     General: No scleral icterus.       Right eye: No discharge.        Left eye: No discharge.     Extraocular Movements: Extraocular movements intact.     Conjunctiva/sclera: Conjunctivae normal.     Pupils: Pupils are equal, round, and reactive to light.  Cardiovascular:     Rate and Rhythm: Normal rate and regular rhythm.     Pulses: Normal pulses.     Heart sounds: Normal heart sounds. No murmur heard.   No friction rub. No gallop.  Pulmonary:     Effort: Pulmonary effort is normal. No respiratory distress.     Breath sounds: Normal breath sounds. No stridor. No wheezing, rhonchi or rales.  Chest:     Chest wall: No tenderness.  Abdominal:     General: Abdomen is flat. Bowel sounds are normal. There is no distension.     Palpations: Abdomen is soft. There is no mass.     Tenderness: There is no abdominal tenderness. There is no right CVA tenderness, left CVA tenderness, guarding or rebound.     Hernia: No hernia is present.  Musculoskeletal:        General: No swelling, tenderness or deformity. Normal range of motion.     Cervical back: Normal range of motion and neck supple. No rigidity or tenderness.     Right lower leg: No edema.     Left lower leg: No edema.  Lymphadenopathy:     Cervical: No cervical adenopathy.  Skin:    General: Skin is warm and dry.      Coloration: Skin is not jaundiced or pale.     Findings: No bruising, erythema, lesion or rash.  Neurological:     General: No focal deficit present.     Mental Status: She is alert and oriented to person, place, and time. Mental status is at baseline.     Cranial Nerves: No cranial nerve deficit.     Sensory: No sensory deficit.     Motor: No weakness.     Coordination: Coordination normal.     Gait: Gait normal.     Deep Tendon Reflexes: Reflexes normal.  Psychiatric:        Mood and Affect: Mood normal.        Behavior: Behavior normal.        Thought Content: Thought content normal.        Judgment: Judgment normal.     Assessment/plan: Laura Ritter is a 56 y.o. female present for re-est/cpe Diabetes mellitus screening - Hemoglobin A1c Lipid screening - Lipid panel Leukopenia, unspecified type/MALT - CBC - TSH - continue f/u w/ onc. S/p wedge resection  Vitamin D deficiency - Comprehensive metabolic panel - VITAMIN D 25 Hydroxy (Vit-D Deficiency, Fractures) Routine general medical examination at a health care facility Colonoscopy: No fhx. completed 2018, by Dr. Collene Mares- 10 yr follow up Mammogram: No fhx. 03/2021. completed:gyn Cervical cancer screening: last pap: , results: 04/12/2018, completed by: gyn Immunizations: tdap UTD 2018, Influenza UTD 09/2020 (encouraged yearly),  zostavax #12/8 (nurse visit for #2 btw 60d and 6 mos) Patient was encouraged to exercise greater than 150 minutes a week. Patient was encouraged to choose a diet filled with fresh fruits and vegetables, and lean meats. AVS provided to patient today for education/recommendation on gender specific health and safety maintenance.  Return in about 1 year (around 02/22/2022) for CPE (30 min) and nurse appt  after 2/19.  Orders Placed This Encounter  Procedures   CBC   Comprehensive metabolic panel   Hemoglobin A1c   Lipid panel   TSH   VITAMIN D 25 Hydroxy (Vit-D Deficiency, Fractures)   No orders of  the defined types were placed in this encounter.  Referral Orders  No referral(s) requested today     Note is dictated utilizing voice recognition software. Although note has been proof read prior to signing, occasional typographical errors still can be missed. If any questions arise, please do not hesitate to call for verification.  Electronically signed by: Howard Pouch, DO [place]

## 2021-02-21 NOTE — Patient Instructions (Signed)
°Great to see you today.  °I have refilled the medication(s) we provide.  ° °If labs were collected, we will inform you of lab results once received either by echart message or telephone call.  ° - echart message- for normal results that have been seen by the patient already.  ° - telephone call: abnormal results or if patient has not viewed results in their echart. ° °Health Maintenance, Female °Adopting a healthy lifestyle and getting preventive care are important in promoting health and wellness. Ask your health care provider about: °The right schedule for you to have regular tests and exams. °Things you can do on your own to prevent diseases and keep yourself healthy. °What should I know about diet, weight, and exercise? °Eat a healthy diet ° °Eat a diet that includes plenty of vegetables, fruits, low-fat dairy products, and lean protein. °Do not eat a lot of foods that are high in solid fats, added sugars, or sodium. °Maintain a healthy weight °Body mass index (BMI) is used to identify weight problems. It estimates body fat based on height and weight. Your health care provider can help determine your BMI and help you achieve or maintain a healthy weight. °Get regular exercise °Get regular exercise. This is one of the most important things you can do for your health. Most adults should: °Exercise for at least 150 minutes each week. The exercise should increase your heart rate and make you sweat (moderate-intensity exercise). °Do strengthening exercises at least twice a week. This is in addition to the moderate-intensity exercise. °Spend less time sitting. Even light physical activity can be beneficial. °Watch cholesterol and blood lipids °Have your blood tested for lipids and cholesterol at 56 years of age, then have this test every 5 years. °Have your cholesterol levels checked more often if: °Your lipid or cholesterol levels are high. °You are older than 56 years of age. °You are at high risk for heart  disease. °What should I know about cancer screening? °Depending on your health history and family history, you may need to have cancer screening at various ages. This may include screening for: °Breast cancer. °Cervical cancer. °Colorectal cancer. °Skin cancer. °Lung cancer. °What should I know about heart disease, diabetes, and high blood pressure? °Blood pressure and heart disease °High blood pressure causes heart disease and increases the risk of stroke. This is more likely to develop in people who have high blood pressure readings or are overweight. °Have your blood pressure checked: °Every 3-5 years if you are 18-39 years of age. °Every year if you are 40 years old or older. °Diabetes °Have regular diabetes screenings. This checks your fasting blood sugar level. Have the screening done: °Once every three years after age 40 if you are at a normal weight and have a low risk for diabetes. °More often and at a younger age if you are overweight or have a high risk for diabetes. °What should I know about preventing infection? °Hepatitis B °If you have a higher risk for hepatitis B, you should be screened for this virus. Talk with your health care provider to find out if you are at risk for hepatitis B infection. °Hepatitis C °Testing is recommended for: °Everyone born from 1945 through 1965. °Anyone with known risk factors for hepatitis C. °Sexually transmitted infections (STIs) °Get screened for STIs, including gonorrhea and chlamydia, if: °You are sexually active and are younger than 56 years of age. °You are older than 56 years of age and your health care provider   tells you that you are at risk for this type of infection. °Your sexual activity has changed since you were last screened, and you are at increased risk for chlamydia or gonorrhea. Ask your health care provider if you are at risk. °Ask your health care provider about whether you are at high risk for HIV. Your health care provider may recommend a  prescription medicine to help prevent HIV infection. If you choose to take medicine to prevent HIV, you should first get tested for HIV. You should then be tested every 3 months for as long as you are taking the medicine. °Pregnancy °If you are about to stop having your period (premenopausal) and you may become pregnant, seek counseling before you get pregnant. °Take 400 to 800 micrograms (mcg) of folic acid every day if you become pregnant. °Ask for birth control (contraception) if you want to prevent pregnancy. °Osteoporosis and menopause °Osteoporosis is a disease in which the bones lose minerals and strength with aging. This can result in bone fractures. If you are 65 years old or older, or if you are at risk for osteoporosis and fractures, ask your health care provider if you should: °Be screened for bone loss. °Take a calcium or vitamin D supplement to lower your risk of fractures. °Be given hormone replacement therapy (HRT) to treat symptoms of menopause. °Follow these instructions at home: °Alcohol use °Do not drink alcohol if: °Your health care provider tells you not to drink. °You are pregnant, may be pregnant, or are planning to become pregnant. °If you drink alcohol: °Limit how much you have to: °0-1 drink a day. °Know how much alcohol is in your drink. In the [place] one drink equals one 12 oz bottle of beer (355 mL), one 5 oz glass of wine (148 mL), or one 1½ oz glass of hard liquor (44 mL). °Lifestyle °Do not use any products that contain nicotine or tobacco. These products include cigarettes, chewing tobacco, and vaping devices, such as e-cigarettes. If you need help quitting, ask your health care provider. °Do not use street drugs. °Do not share needles. °Ask your health care provider for help if you need support or information about quitting drugs. °General instructions °Schedule regular health, dental, and eye exams. °Stay current with your vaccines. °Tell your health care provider if: °You often  feel depressed. °You have ever been abused or do not feel safe at home. °Summary °Adopting a healthy lifestyle and getting preventive care are important in promoting health and wellness. °Follow your health care provider's instructions about healthy diet, exercising, and getting tested or screened for diseases. °Follow your health care provider's instructions on monitoring your cholesterol and blood pressure. °This information is not intended to replace advice given to you by your health care provider. Make sure you discuss any questions you have with your health care provider. °Document Revised: 06/07/2020 Document Reviewed: 06/07/2020 °Elsevier Patient Education © 2022 [employer] ° °

## 2021-02-22 LAB — HEMOGLOBIN A1C
Hgb A1c MFr Bld: 4.9 % of total Hgb (ref <5.7)
Mean Plasma Glucose: 94 mg/dL
eAG (mmol/L): 5.2 mmol/L

## 2021-02-22 LAB — LIPID PANEL
Cholesterol: 171 mg/dL (ref <200)
HDL: 73 mg/dL (ref >=50)
LDL Cholesterol (Calc): 82 mg/dL (calc)
Non-HDL Cholesterol (Calc): 98 mg/dL (calc) (ref <130)
Total CHOL/HDL Ratio: 2.3 (calc) (ref <5.0)
Triglycerides: 81 mg/dL (ref <150)

## 2021-02-22 LAB — COMPREHENSIVE METABOLIC PANEL
AG Ratio: 2.1 (calc) (ref 1.0–2.5)
ALT: 21 U/L (ref 6–29)
AST: 19 U/L (ref 10–35)
Albumin: 4.4 g/dL (ref 3.6–5.1)
Alkaline phosphatase (APISO): 43 U/L (ref 37–153)
BUN: 14 mg/dL (ref 7–25)
CO2: 26 mmol/L (ref 20–32)
Calcium: 9.3 mg/dL (ref 8.6–10.4)
Chloride: 104 mmol/L (ref 98–110)
Creat: 0.85 mg/dL (ref 0.50–1.03)
Globulin: 2.1 g/dL (calc) (ref 1.9–3.7)
Glucose, Bld: 78 mg/dL (ref 65–99)
Potassium: 3.9 mmol/L (ref 3.5–5.3)
Sodium: 141 mmol/L (ref 135–146)
Total Bilirubin: 0.6 mg/dL (ref 0.2–1.2)
Total Protein: 6.5 g/dL (ref 6.1–8.1)

## 2021-02-22 LAB — TSH: TSH: 0.53 mIU/L

## 2021-02-22 LAB — CBC
HCT: 39.2 % (ref 35.0–45.0)
Hemoglobin: 13.4 g/dL (ref 11.7–15.5)
MCH: 29.6 pg (ref 27.0–33.0)
MCHC: 34.2 g/dL (ref 32.0–36.0)
MCV: 86.5 fL (ref 80.0–100.0)
MPV: 10.3 fL (ref 7.5–12.5)
Platelets: 184 10*3/uL (ref 140–400)
RBC: 4.53 10*6/uL (ref 3.80–5.10)
RDW: 12.7 % (ref 11.0–15.0)
WBC: 3 10*3/uL — ABNORMAL LOW (ref 3.8–10.8)

## 2021-02-22 LAB — VITAMIN D 25 HYDROXY (VIT D DEFICIENCY, FRACTURES): Vit D, 25-Hydroxy: 36 ng/mL (ref 30–100)

## 2021-03-08 ENCOUNTER — Other Ambulatory Visit: Payer: Self-pay | Admitting: Obstetrics and Gynecology

## 2021-03-08 DIAGNOSIS — Z1231 Encounter for screening mammogram for malignant neoplasm of breast: Secondary | ICD-10-CM

## 2021-03-14 ENCOUNTER — Encounter: Payer: Self-pay | Admitting: Family Medicine

## 2021-03-14 DIAGNOSIS — E559 Vitamin D deficiency, unspecified: Secondary | ICD-10-CM

## 2021-03-14 NOTE — Telephone Encounter (Signed)
Please advise 

## 2021-03-16 NOTE — Telephone Encounter (Signed)
Email sent to [facility] rep to follow up on billing concern

## 2021-03-18 NOTE — Telephone Encounter (Signed)
Dx: Vitamin D deficiency already attached. Please advise.

## 2021-03-19 IMAGING — CR DG CHEST 1V PORT
1 series · 1 of 1 positions shown · non-contrast
Comparison: 03/09/2020

CLINICAL DATA: Right lower lobe wedge resection.

EXAM:
PORTABLE CHEST 1 VIEW

[AP]
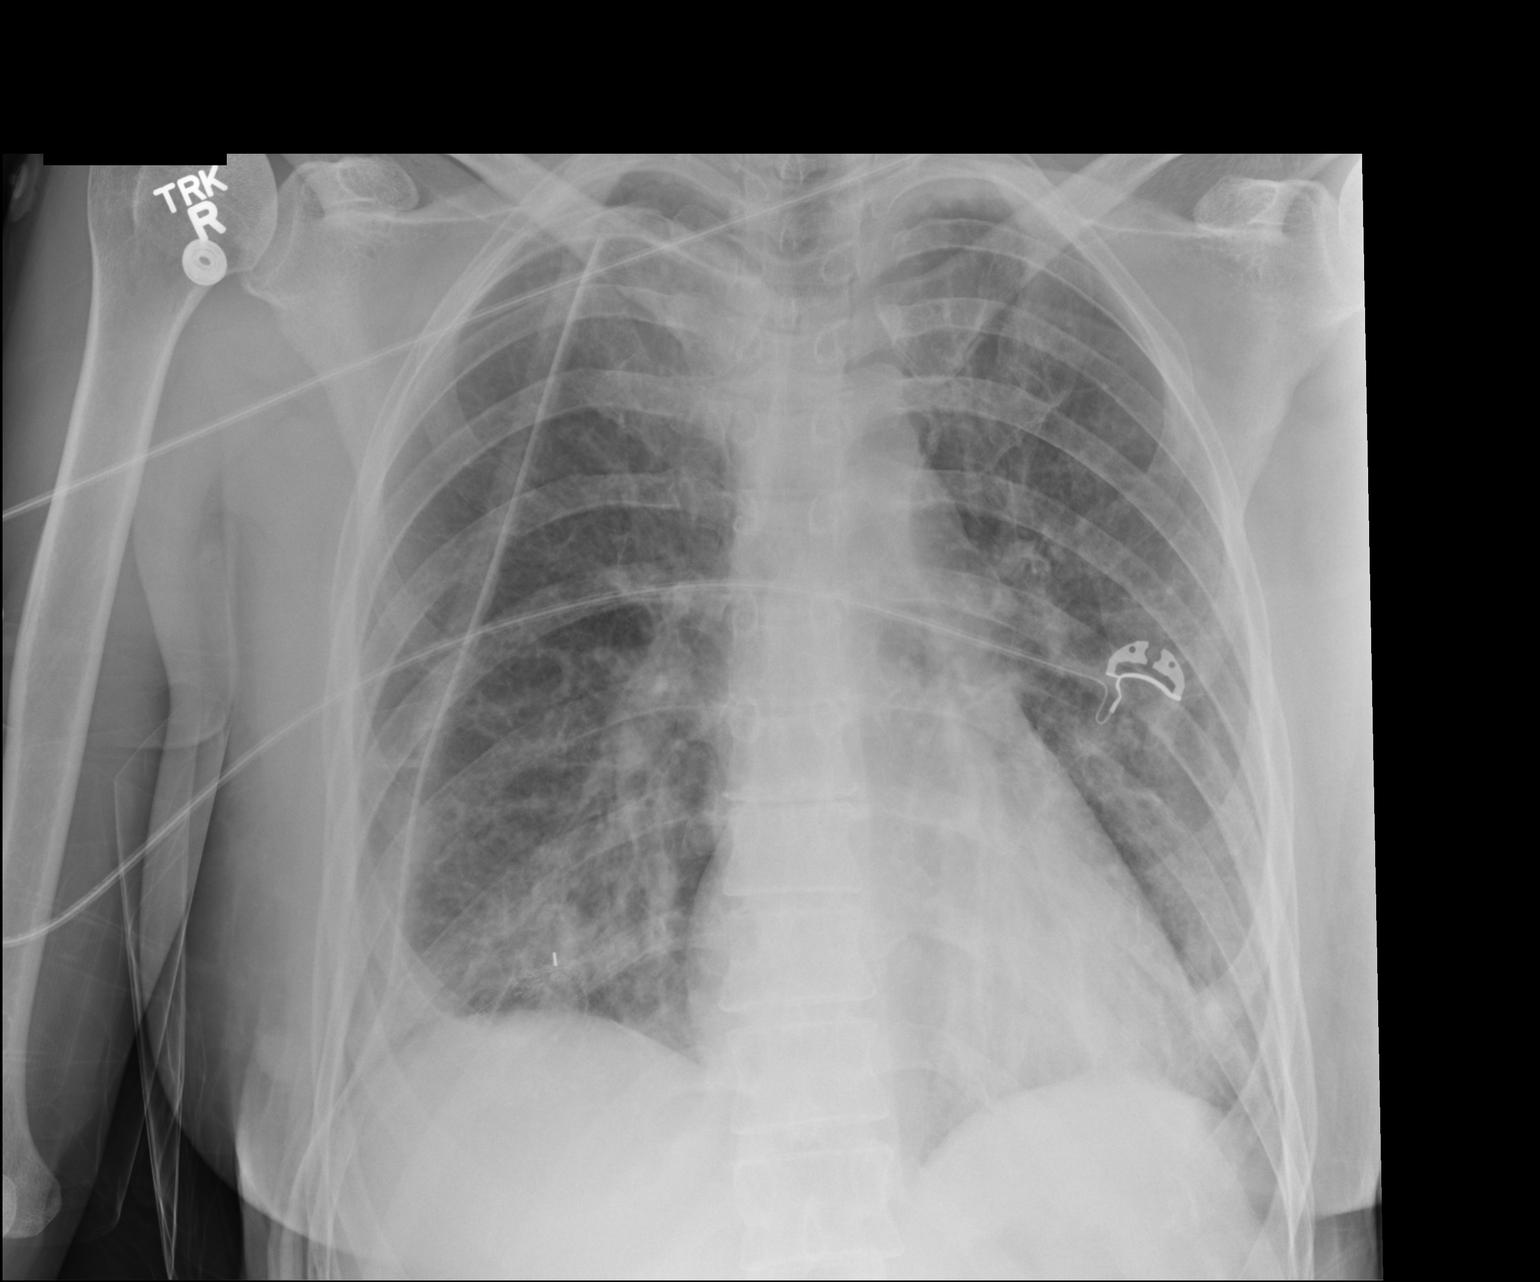

[1 of 1 positions shown; findings below may reference images not displayed]

FINDINGS: Surgical staples in the right lung base with associated metal clips.
Surrounding airspace disease may represent postoperative change in
hemorrhage.

Right chest tube in place.  Small right apical pneumothorax.

Patchy airspace disease bilaterally could represent pneumonia or
edema.
IMPRESSION: Right lower lobectomy.  Small right apical pneumothorax

Patchy bilateral airspace disease could represent edema or
pneumonia.

## 2021-03-20 IMAGING — DX DG CHEST 1V PORT
1 series · 1 of 1 positions shown · non-contrast
Comparison: 03/11/2020

CLINICAL DATA: Chest tube.  Lung nodules.

EXAM:
PORTABLE CHEST 1 VIEW

[chest]
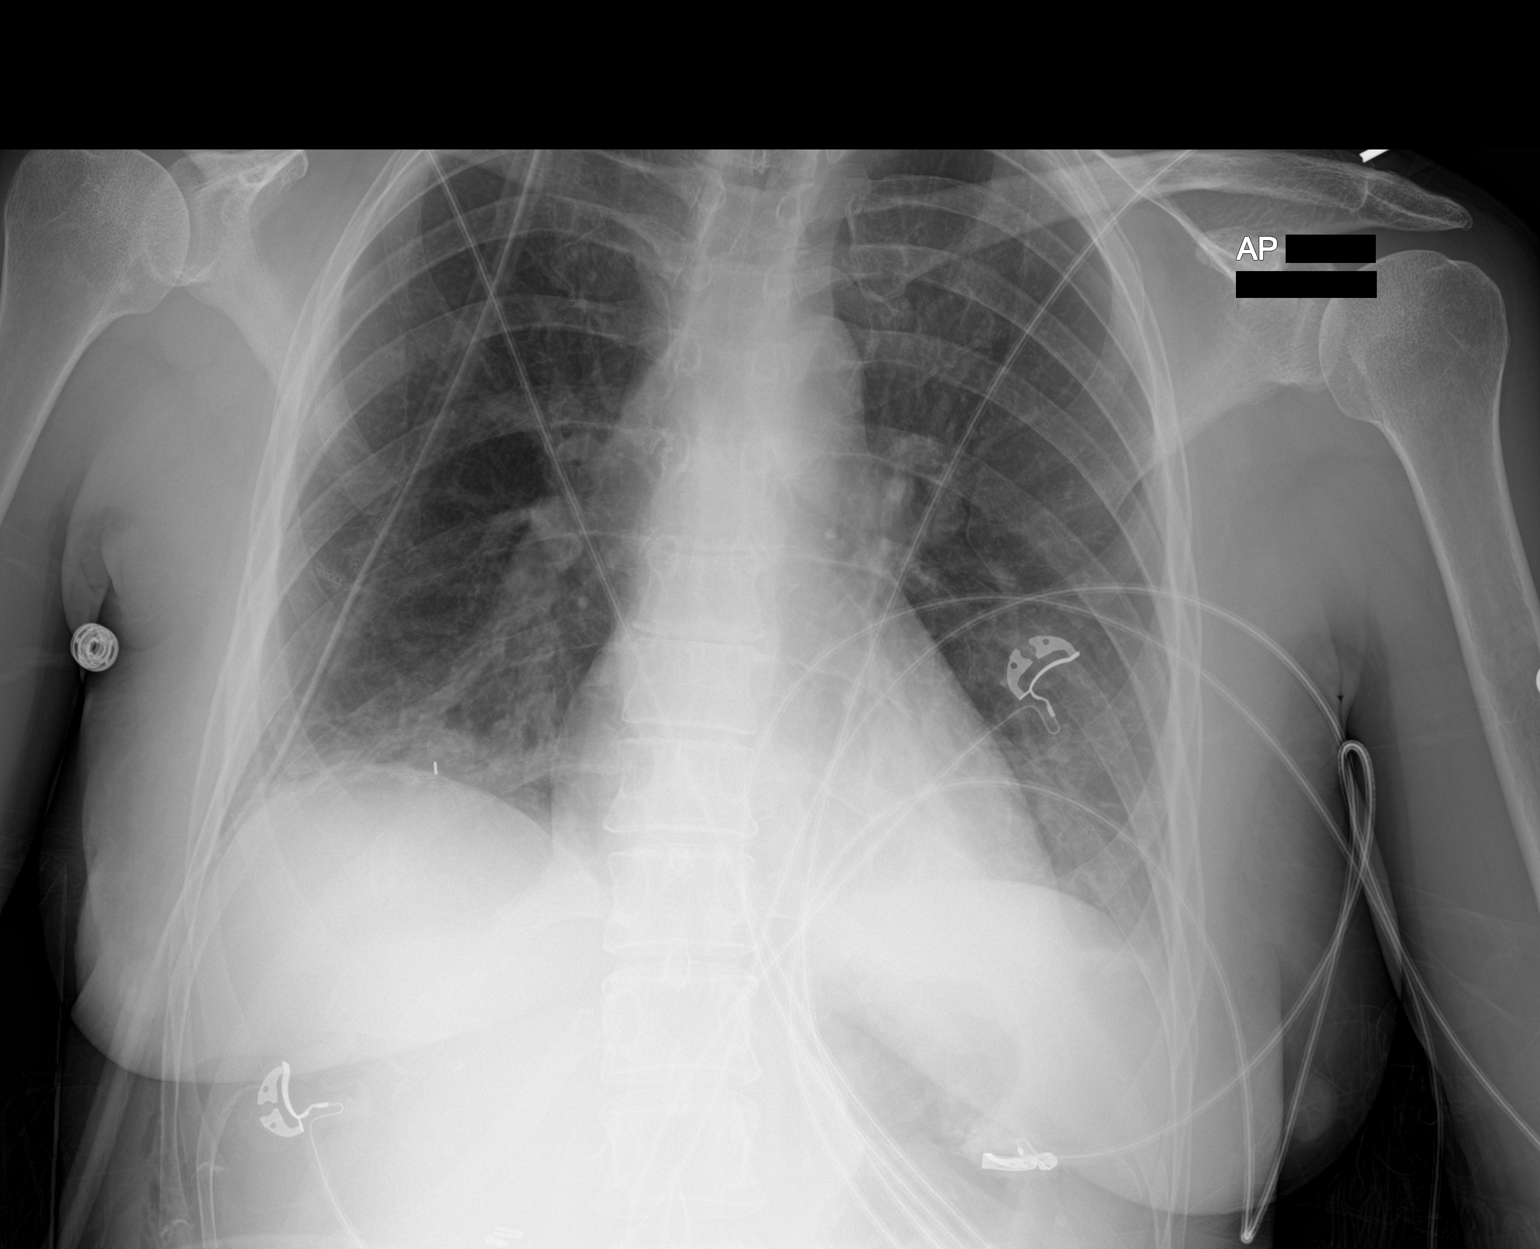

[1 of 1 positions shown; findings below may reference images not displayed]

FINDINGS: Chest tube extending into the right apex is unchanged. Improvement
in small right apical pneumothorax which is now difficult to
identify. Right lower lobe airspace disease slightly improved.
Improvement in left lower lobe atelectasis. No significant pleural
effusion.
IMPRESSION: Interval improvement in small right apical pneumothorax

Improved aeration in the lung bases.

## 2021-03-22 NOTE — Telephone Encounter (Signed)
I have sent a 2nd msg to rep for [facility] to f/u regarding DX/billing to pts insurance.

## 2021-03-23 NOTE — Telephone Encounter (Signed)
No action needed

## 2021-03-25 ENCOUNTER — Ambulatory Visit (INDEPENDENT_AMBULATORY_CARE_PROVIDER_SITE_OTHER): Payer: No Typology Code available for payment source

## 2021-03-25 ENCOUNTER — Other Ambulatory Visit: Payer: Self-pay

## 2021-03-25 DIAGNOSIS — Z23 Encounter for immunization: Secondary | ICD-10-CM | POA: Diagnosis not present

## 2021-03-29 ENCOUNTER — Encounter: Payer: Self-pay | Admitting: Family Medicine

## 2021-03-29 DIAGNOSIS — E559 Vitamin D deficiency, unspecified: Secondary | ICD-10-CM | POA: Insufficient documentation

## 2021-03-29 HISTORY — DX: Vitamin D deficiency, unspecified: E55.9

## 2021-03-29 NOTE — Telephone Encounter (Signed)
Please advise on message below.

## 2021-03-29 NOTE — Telephone Encounter (Signed)
Please see message below

## 2021-03-29 NOTE — Telephone Encounter (Signed)
Per my note, it was ordered secondary to her history of vitamin D deficiency.  Which is Code E55.9.  Thanks

## 2021-04-19 ENCOUNTER — Other Ambulatory Visit: Payer: Self-pay | Admitting: Obstetrics and Gynecology

## 2021-04-19 DIAGNOSIS — N952 Postmenopausal atrophic vaginitis: Secondary | ICD-10-CM

## 2021-04-19 NOTE — Progress Notes (Signed)
56 y.o. G44P3003 Married White or Caucasian Not Hispanic or Latino female here for annual exam.  No vaginal bleeding. She is using vaginal estrogen cream, wants to switch back to the tablet. No dyspareunia. ? ?No bowel or bladder c/o.  ? ?H/O MALT lymphoma of the lungs.  She had a right lower lobe lung wedge resection in 2/22. Being followed yearly.  ?  ? ?Patient's last menstrual period was 01/12/2014 (lmp unknown).          ?Sexually active: Yes.    ?The current method of family planning is post menopausal status.    ?Exercising: Yes.     ?Smoker:  no ? ?Health Maintenance: ?Pap:  04-12-18 normal, negative hpv ?History of abnormal Pap:  no ?MMG:  04-28-21 normal ?BMD:   never ?Colonoscopy: 12-27-16 normal, f/u 10 years.  ?TDaP:  2018 ?Gardasil: never ? ? reports that she has never smoked. She has never been exposed to tobacco smoke. She has never used smokeless tobacco. She reports current alcohol use of about 6.0 standard drinks per week. She reports that she does not use drugs.  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 3 kids (2 boys, 1 girl): 56, 21 and 3. ? ?Past Medical History:  ?Diagnosis Date  ? Chicken pox   ? Chronic kidney disease 4/09  ? Kidney stones  ? History of kidney stones 05/2007  ? followed yearly follow up- Dr. Jeffie Pollock  ? Leukopenia   ? Lymphoma ([place])   ? Migraine   ? PVC (premature ventricular contraction) 04/01/2015  ? anxiety related  ? ? ?Past Surgical History:  ?Procedure Laterality Date  ? BRONCHIAL BIOPSY  11/28/2019  ? Procedure: BRONCHIAL BIOPSIES;  Surgeon: Garner Nash, DO;  Location: [facility];  Service: Pulmonary;;  ? BRONCHIAL BRUSHINGS  11/28/2019  ? Procedure: BRONCHIAL BRUSHINGS;  Surgeon: Garner Nash, DO;  Location: [facility];  Service: Pulmonary;;  ? BRONCHIAL NEEDLE ASPIRATION BIOPSY  11/28/2019  ? Procedure: BRONCHIAL NEEDLE ASPIRATION BIOPSIES;  Surgeon: Garner Nash, DO;  Location: [facility];  Service: Pulmonary;;  ? BRONCHIAL WASHINGS   11/28/2019  ? Procedure: BRONCHIAL WASHINGS;  Surgeon: Garner Nash, DO;  Location: [place] [facility];  Service: Pulmonary;;  ? CESAREAN SECTION    ? x3  ? COLONOSCOPY    ? EXTRACORPOREAL SHOCK WAVE LITHOTRIPSY Right 04/19/2017  ? Procedure: RIGHT EXTRACORPOREAL SHOCK WAVE LITHOTRIPSY (ESWL);  Surgeon: Ceasar Mons, MD;  [facility];  Service: Urology;  Laterality: Right;  ? EYE SURGERY  March 2006  ? Lasix  ? FIDUCIAL MARKER PLACEMENT  11/28/2019  ? Procedure: FIDUCIAL MARKER PLACEMENT;  Surgeon: Garner Nash, DO;  Location: [facility];  Service: Pulmonary;;  ? INTERCOSTAL NERVE BLOCK Right 03/11/2020  ? Procedure: INTERCOSTAL NERVE BLOCK;  Surgeon: Melrose Nakayama, MD;  Location: [facility];  Service: Thoracic;  Laterality: Right;  ? LAPAROSCOPIC CHOLECYSTECTOMY  2001  ? LUMBAR LAMINECTOMY  01/2014  ? herniated disc  ? LYMPH NODE DISSECTION N/A 03/11/2020  ? Procedure: LYMPH NODE DISSECTION;  Surgeon: Melrose Nakayama, MD;  Location: [facility];  Service: Thoracic;  Laterality: N/A;  ? REFRACTIVE SURGERY Bilateral 2006  ? lasik  ? SPINE SURGERY    ? VIDEO BRONCHOSCOPY WITH ENDOBRONCHIAL NAVIGATION N/A 11/28/2019  ? Procedure: VIDEO BRONCHOSCOPY WITH ENDOBRONCHIAL NAVIGATION;  Surgeon: Garner Nash, DO;  Location: [place];  Service: Pulmonary;  Laterality: N/A;  ? WISDOM TOOTH EXTRACTION    ? XI ROBOTIC ASSISTED  THORASCOPY - RIGHT LOWER LOBE WEDGE RESECTION (Right)  03/11/2020  ? path pending. lung nodules  ? ? ?Current Outpatient Medications  ?Medication Sig Dispense Refill  ? Calcium Carbonate-Vitamin D 600-400 MG-UNIT tablet Take 1 tablet by mouth daily.    ? [START ON 05/02/2021] Estradiol 10 MCG TABS vaginal tablet Place 1 tablet (10 mcg total) vaginally 2 (two) times a week. 24 tablet 4  ? loratadine (CLARITIN) 5 MG chewable tablet Chew 5 mg by mouth daily. PRN    ? melatonin 5 MG TABS Take 5 mg by mouth. PRN    ? Multiple Vitamins-Minerals (MULTIVITAMIN PO) Take 1 tablet by  mouth daily.     ? Omega-3 Fatty Acids (FISH OIL PO) Take 1 tablet by mouth daily.     ? ?No current facility-administered medications for this visit.  ? ? ?Family History  ?Problem Relation Age of Onset  ? Hyperlipidemia Mother   ? Alcohol abuse Mother   ? Arthritis Mother   ? Alzheimer's disease Mother   ? Hypertension Father   ? Parkinson's disease Father   ? Alcohol abuse Father   ? Kidney Stones Brother   ? Alcohol abuse Brother   ? Dementia Maternal Grandmother   ? Heart disease Paternal Grandfather   ? Parkinson's disease Paternal Grandfather   ?Mom died in 26, father died in 04-27-2009 ? ?Review of Systems  ?All other systems reviewed and are negative. ? ?Exam:   ?BP 116/74 (BP Location: Right Arm, Patient Position: Sitting, Cuff Size: Normal)   Pulse 64   Ht '5\' 7"'$  (1.702 m)   Wt 134 lb (60.8 kg)   LMP 01/12/2014 (LMP Unknown)   SpO2 99%   BMI 20.99 kg/m?   Weight change: '@WEIGHTCHANGE'$ @ Height:   Height: '5\' 7"'$  (170.2 cm)  ?Ht Readings from Last 3 Encounters:  ?04/29/21 '5\' 7"'$  (1.702 m)  ?02/21/21 '5\' 7"'$  (1.702 m)  ?10/11/20 '5\' 7"'$  (1.702 m)  ? ? ?General appearance: alert, cooperative and appears stated age ?Head: Normocephalic, without obvious abnormality, atraumatic ?Neck: no adenopathy, supple, symmetrical, trachea midline and thyroid normal to inspection and palpation ?Lungs: clear to auscultation bilaterally ?Cardiovascular: regular rate and rhythm ?Breasts: normal appearance, no masses or tenderness ?Abdomen: soft, non-tender; non distended,  no masses,  no organomegaly ?Extremities: extremities normal, atraumatic, no cyanosis or edema ?Skin: Skin color, texture, turgor normal. No rashes or lesions ?Lymph nodes: Cervical, supraclavicular, and axillary nodes normal. ?No abnormal inguinal nodes palpated ?Neurologic: Grossly normal ? ? ?Pelvic: External genitalia:  no lesions ?             Urethra:  normal appearing urethra with no masses, tenderness or lesions ?             Bartholins and Skenes: normal     ?             Vagina: normal appearing vagina with normal color and discharge, no lesions ?             Cervix: no lesions ?              ?Bimanual Exam:  Uterus:  normal size, contour, position, consistency, mobility, non-tender ?             Adnexa: no mass, fullness, tenderness ?              Rectovaginal: Confirms ?              Anus:  normal sphincter tone, no lesions ? ?  Caryn Bee, CMA chaperoned for the exam. ? ?1. Well woman exam ?Discussed breast self exam ?Discussed calcium and vit D intake ?No pap this year ?Mammogram and colonoscopy UTD ?Labs with primary ? ?2. Vaginal atrophy ?Doing well on vaginal estrogen, wants to go back to the tablets.  ?- Estradiol 10 MCG TABS vaginal tablet; Place 1 tablet (10 mcg total) vaginally 2 (two) times a week.  Dispense: 24 tablet; Refill: 4 ? ?  ?

## 2021-04-19 NOTE — Telephone Encounter (Signed)
Last AEX 04/21/20--scheduled for 04/27/21. ?Last mammo 04/21/20--scheduled for 04/28/21. ? ?Sent mychart msg to pt: ? ?"Good morning Laura Ritter, we received a prescription refill request from [facility] for your estradiol vaginal cream. I see that you are scheduled for your annual exam on 04/27/21. Do you need a refill before then or do you have enough to last until annual and then the provider can prescribe enough for one year. Let us know, thanks." ?

## 2021-04-19 NOTE — Progress Notes (Deleted)
56 y.o. G67P3003 Married White or Caucasian Not Hispanic or Latino female here for annual exam.   ?  ? ?Patient's last menstrual period was 01/12/2014 (lmp unknown).          ?Sexually active: {yes no:314532}  ?The current method of family planning is {contraception:315051}.    ?Exercising: {yes no:314532}  {types:19826} ?Smoker:  {YES NO:22349} ? ?Health Maintenance: ?Pap:  04-12-2018 neg HPV HR neg,  04-05-16 neg ?History of abnormal Pap:  no ?MMG:  04/21/20 density B Bi-rads 1 neg  ?BMD:   none  ?Colonoscopy: 12/27/16 10 year follow up  ?TDaP:  2018  ?Gardasil: n/a ? ? reports that she has never smoked. She has never been exposed to tobacco smoke. She has never used smokeless tobacco. She reports current alcohol use of about 6.0 standard drinks per week. She reports that she does not use drugs. ? ?Past Medical History:  ?Diagnosis Date  ? Chicken pox   ? Chronic kidney disease 4/09  ? Kidney stones  ? History of kidney stones 05/2007  ? followed yearly follow up- Dr. Jeffie Pollock  ? Leukopenia   ? Lymphoma ([place])   ? Migraine   ? PVC (premature ventricular contraction) 04/01/2015  ? anxiety related  ? ? ?Past Surgical History:  ?Procedure Laterality Date  ? BRONCHIAL BIOPSY  11/28/2019  ? Procedure: BRONCHIAL BIOPSIES;  Surgeon: Garner Nash, DO;  Location: [facility];  Service: Pulmonary;;  ? BRONCHIAL BRUSHINGS  11/28/2019  ? Procedure: BRONCHIAL BRUSHINGS;  Surgeon: Garner Nash, DO;  Location: [facility];  Service: Pulmonary;;  ? BRONCHIAL NEEDLE ASPIRATION BIOPSY  11/28/2019  ? Procedure: BRONCHIAL NEEDLE ASPIRATION BIOPSIES;  Surgeon: Garner Nash, DO;  Location: [facility];  Service: Pulmonary;;  ? BRONCHIAL WASHINGS  11/28/2019  ? Procedure: BRONCHIAL WASHINGS;  Surgeon: Garner Nash, DO;  Location: [facility];  Service: Pulmonary;;  ? CESAREAN SECTION    ? x3  ? COLONOSCOPY    ? EXTRACORPOREAL SHOCK WAVE LITHOTRIPSY Right 04/19/2017  ? Procedure: RIGHT EXTRACORPOREAL SHOCK WAVE LITHOTRIPSY  (ESWL);  Surgeon: Ceasar Mons, MD;  [facility];  Service: Urology;  Laterality: Right;  ? EYE SURGERY  March 2006  ? Lasix  ? FIDUCIAL MARKER PLACEMENT  11/28/2019  ? Procedure: FIDUCIAL MARKER PLACEMENT;  Surgeon: Garner Nash, DO;  Location: [facility];  Service: Pulmonary;;  ? INTERCOSTAL NERVE BLOCK Right 03/11/2020  ? Procedure: INTERCOSTAL NERVE BLOCK;  Surgeon: Melrose Nakayama, MD;  Location: [facility] [place];  Service: Thoracic;  Laterality: Right;  ? LAPAROSCOPIC CHOLECYSTECTOMY  2001  ? LUMBAR LAMINECTOMY  01/2014  ? herniated disc  ? LYMPH NODE DISSECTION N/A 03/11/2020  ? Procedure: LYMPH NODE DISSECTION;  Surgeon: Melrose Nakayama, MD;  Location: [facility];  Service: Thoracic;  Laterality: N/A;  ? REFRACTIVE SURGERY Bilateral 2006  ? lasik  ? SPINE SURGERY    ? VIDEO BRONCHOSCOPY WITH ENDOBRONCHIAL NAVIGATION N/A 11/28/2019  ? Procedure: VIDEO BRONCHOSCOPY WITH ENDOBRONCHIAL NAVIGATION;  Surgeon: Garner Nash, DO;  Location: [facility];  Service: Pulmonary;  Laterality: N/A;  ? WISDOM TOOTH EXTRACTION    ? XI ROBOTIC ASSISTED THORASCOPY - RIGHT LOWER LOBE WEDGE RESECTION (Right)  03/11/2020  ? path pending. lung nodules  ? ? ?Current Outpatient Medications  ?Medication Sig Dispense Refill  ? Calcium Carbonate-Vitamin D 600-400 MG-UNIT tablet Take 1 tablet by mouth daily.    ? estradiol (ESTRACE) 0.1 MG/GM vaginal cream 1 gram vaginally twice weekly 42.5 g 0  ?  Estradiol 10 MCG TABS vaginal tablet INSERT 1 TABLET VAGINALLY  TWICE WEEKLY FOR VAGINAL  DRYNESS 24 tablet 3  ? loratadine (CLARITIN) 5 MG chewable tablet Chew 5 mg by mouth daily. PRN    ? melatonin 5 MG TABS Take 5 mg by mouth. PRN    ? Multiple Vitamins-Minerals (MULTIVITAMIN PO) Take 1 tablet by mouth daily.     ? Omega-3 Fatty Acids (FISH OIL PO) Take 1 tablet by mouth daily.     ? ?No current facility-administered medications for this visit.  ? ? ?Family History  ?Problem Relation Age of Onset  ? Hyperlipidemia  Mother   ? Alcohol abuse Mother   ? Arthritis Mother   ? Alzheimer's disease Mother   ? Hypertension Father   ? Parkinson's disease Father   ? Alcohol abuse Father   ? Kidney Stones Brother   ? Alcohol abuse Brother   ? Dementia Maternal Grandmother   ? Heart disease Paternal Grandfather   ? Parkinson's disease Paternal Grandfather   ? ? ?Review of Systems ? ?Exam:   ?LMP 01/12/2014 (LMP Unknown)   Weight change: '@WEIGHTCHANGE'$ @ Height:      ?Ht Readings from Last 3 Encounters:  ?02/21/21 '5\' 7"'$  (1.702 m)  ?10/11/20 '5\' 7"'$  (1.702 m)  ?05/04/20 '5\' 7"'$  (1.702 m)  ? ? ?General appearance: alert, cooperative and appears stated age ?Head: Normocephalic, without obvious abnormality, atraumatic ?Neck: no adenopathy, supple, symmetrical, trachea midline and thyroid {CHL AMB PHY EX THYROID NORM DEFAULT:[id number]::"normal to inspection and palpation"} ?Lungs: clear to auscultation bilaterally ?Cardiovascular: regular rate and rhythm ?Breasts: {Exam; breast:13139::"normal appearance, no masses or tenderness"} ?Abdomen: soft, non-tender; non distended,  no masses,  no organomegaly ?Extremities: extremities normal, atraumatic, no cyanosis or edema ?Skin: Skin color, texture, turgor normal. No rashes or lesions ?Lymph nodes: Cervical, supraclavicular, and axillary nodes normal. ?No abnormal inguinal nodes palpated ?Neurologic: Grossly normal ? ? ?Pelvic: External genitalia:  no lesions ?             Urethra:  normal appearing urethra with no masses, tenderness or lesions ?             Bartholins and Skenes: normal    ?             Vagina: normal appearing vagina with normal color and discharge, no lesions ?             Cervix: {CHL AMB PHY EX CERVIX NORM DEFAULT:[phone]::"no lesions"} ?              ?Bimanual Exam:  Uterus:  {CHL AMB PHY EX UTERUS NORM DEFAULT:[phone]::"normal size, contour, position, consistency, mobility, non-tender"} ?             Adnexa: {CHL AMB PHY EX ADNEXA NO MASS DEFAULT:[phone]::"no mass,  fullness, tenderness"} ?              Rectovaginal: Confirms ?              Anus:  normal sphincter tone, no lesions ? ?*** chaperoned for the exam. ? ?A:  Well Woman with normal exam ? ?P:    ? ? ? ?

## 2021-04-27 ENCOUNTER — Ambulatory Visit: Payer: No Typology Code available for payment source | Admitting: Obstetrics and Gynecology

## 2021-04-28 ENCOUNTER — Ambulatory Visit (INDEPENDENT_AMBULATORY_CARE_PROVIDER_SITE_OTHER): Payer: No Typology Code available for payment source

## 2021-04-28 DIAGNOSIS — Z1231 Encounter for screening mammogram for malignant neoplasm of breast: Secondary | ICD-10-CM

## 2021-04-29 ENCOUNTER — Ambulatory Visit (INDEPENDENT_AMBULATORY_CARE_PROVIDER_SITE_OTHER): Payer: No Typology Code available for payment source | Admitting: Obstetrics and Gynecology

## 2021-04-29 ENCOUNTER — Encounter: Payer: Self-pay | Admitting: Obstetrics and Gynecology

## 2021-04-29 VITALS — BP 116/74 | HR 64 | Ht 67.0 in | Wt 134.0 lb

## 2021-04-29 DIAGNOSIS — N952 Postmenopausal atrophic vaginitis: Secondary | ICD-10-CM | POA: Diagnosis not present

## 2021-04-29 DIAGNOSIS — Z01419 Encounter for gynecological examination (general) (routine) without abnormal findings: Secondary | ICD-10-CM | POA: Diagnosis not present

## 2021-04-29 MED ORDER — ESTRADIOL 10 MCG VA TABS: 1.0000 | ORAL_TABLET | VAGINAL | 4 refills | Status: DC

## 2021-04-29 NOTE — Patient Instructions (Signed)

## 2021-08-22 ENCOUNTER — Telehealth: Payer: Self-pay | Admitting: Internal Medicine

## 2021-08-22 NOTE — Telephone Encounter (Signed)
Called patient regarding upcoming September appointments, patient has been called and notified. 

## 2021-10-06 ENCOUNTER — Inpatient Hospital Stay: Payer: No Typology Code available for payment source | Attending: Internal Medicine

## 2021-10-06 ENCOUNTER — Other Ambulatory Visit: Payer: Self-pay

## 2021-10-06 ENCOUNTER — Ambulatory Visit (HOSPITAL_COMMUNITY)
Admission: RE | Admit: 2021-10-06 | Discharge: 2021-10-06 | Disposition: A | Discharge status: Home / Self Care | Payer: No Typology Code available for payment source | Source: Ambulatory Visit | Attending: Internal Medicine | Admitting: Internal Medicine

## 2021-10-06 DIAGNOSIS — Z87442 Personal history of urinary calculi: Secondary | ICD-10-CM | POA: Insufficient documentation

## 2021-10-06 DIAGNOSIS — C884 Extranodal marginal zone B-cell lymphoma of mucosa-associated lymphoid tissue [MALT-lymphoma]: Secondary | ICD-10-CM | POA: Insufficient documentation

## 2021-10-06 DIAGNOSIS — Z79899 Other long term (current) drug therapy: Secondary | ICD-10-CM | POA: Insufficient documentation

## 2021-10-06 DIAGNOSIS — N189 Chronic kidney disease, unspecified: Secondary | ICD-10-CM | POA: Diagnosis not present

## 2021-10-06 DIAGNOSIS — D72819 Decreased white blood cell count, unspecified: Secondary | ICD-10-CM | POA: Insufficient documentation

## 2021-10-06 LAB — CBC WITH DIFFERENTIAL (CANCER CENTER ONLY)
Abs Immature Granulocytes: 0 10*3/uL (ref 0.00–0.07)
Basophils Absolute: 0 10*3/uL (ref 0.0–0.1)
Basophils Relative: 1 %
Eosinophils Absolute: 0.1 10*3/uL (ref 0.0–0.5)
Eosinophils Relative: 2 %
HCT: 41.4 % (ref 36.0–46.0)
Hemoglobin: 14.2 g/dL (ref 12.0–15.0)
Immature Granulocytes: 0 %
Lymphocytes Relative: 23 %
Lymphs Abs: 0.6 10*3/uL — ABNORMAL LOW (ref 0.7–4.0)
MCH: 29.8 pg (ref 26.0–34.0)
MCHC: 34.3 g/dL (ref 30.0–36.0)
MCV: 86.8 fL (ref 80.0–100.0)
Monocytes Absolute: 0.2 10*3/uL (ref 0.1–1.0)
Monocytes Relative: 8 %
Neutro Abs: 1.7 10*3/uL (ref 1.7–7.7)
Neutrophils Relative %: 66 %
Platelet Count: 164 10*3/uL (ref 150–400)
RBC: 4.77 MIL/uL (ref 3.87–5.11)
RDW: 12.8 % (ref 11.5–15.5)
WBC Count: 2.6 10*3/uL — ABNORMAL LOW (ref 4.0–10.5)
nRBC: 0 % (ref 0.0–0.2)

## 2021-10-06 LAB — CMP (CANCER CENTER ONLY)
ALT: 16 U/L (ref 0–44)
AST: 18 U/L (ref 15–41)
Albumin: 4.7 g/dL (ref 3.5–5.0)
Alkaline Phosphatase: 46 U/L (ref 38–126)
Anion gap: 3 — ABNORMAL LOW (ref 5–15)
BUN: 16 mg/dL (ref 6–20)
CO2: 31 mmol/L (ref 22–32)
Calcium: 9.6 mg/dL (ref 8.9–10.3)
Chloride: 104 mmol/L (ref 98–111)
Creatinine: 0.91 mg/dL (ref 0.44–1.00)
GFR, Estimated: >60 mL/min (ref >60)
Glucose, Bld: 91 mg/dL (ref 70–99)
Potassium: 4.2 mmol/L (ref 3.5–5.1)
Sodium: 138 mmol/L (ref 135–145)
Total Bilirubin: 0.7 mg/dL (ref 0.3–1.2)
Total Protein: 7.2 g/dL (ref 6.5–8.1)

## 2021-10-06 LAB — LACTATE DEHYDROGENASE: LDH: 143 U/L (ref 98–192)

## 2021-10-10 ENCOUNTER — Ambulatory Visit: Payer: No Typology Code available for payment source | Admitting: Internal Medicine

## 2021-10-11 ENCOUNTER — Inpatient Hospital Stay (HOSPITAL_BASED_OUTPATIENT_CLINIC_OR_DEPARTMENT_OTHER): Payer: No Typology Code available for payment source | Admitting: Internal Medicine

## 2021-10-11 VITALS — BP 106/65 | HR 69 | Temp 98.2°F | Resp 16 | Ht 67.0 in | Wt 138.5 lb

## 2021-10-11 DIAGNOSIS — C884 Extranodal marginal zone B-cell lymphoma of mucosa-associated lymphoid tissue [MALT-lymphoma]: Secondary | ICD-10-CM

## 2021-10-11 NOTE — Progress Notes (Signed)
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 Roxan Hockey.  CURRENT THERAPY: Observation  INTERVAL HISTORY: Laura Ritter 56 y.o. female returns to the clinic today for annual follow-up visit.  The patient is feeling fine today with no concerning complaints.  She denied having any chest pain, shortness of breath, cough or hemoptysis.  She denied having any nausea, vomiting, diarrhea or constipation.  She has no headache or visual changes.  She has no recent weight loss or night sweats.  She is here today for evaluation with repeat blood work and chest x-ray.  MEDICAL HISTORY: Past Medical History:  Diagnosis Date   Chicken pox    Chronic kidney disease 4/09   Kidney stones   History of kidney stones 05/2007   followed yearly follow up- Dr. Jeffie Pollock   Leukopenia    Lymphoma [facility])    Migraine    PVC (premature ventricular contraction) 04/01/2015   anxiety related    ALLERGIES:  has No Known Allergies.  MEDICATIONS:  Current Outpatient Medications  Medication Sig Dispense Refill   Calcium Carbonate-Vitamin D 600-400 MG-UNIT tablet Take 1 tablet by mouth daily.     Estradiol 10 MCG TABS vaginal tablet Place 1 tablet (10 mcg total) vaginally 2 (two) times a week. 24 tablet 4   loratadine (CLARITIN) 5 MG chewable tablet Chew 5 mg by mouth daily. PRN     melatonin 5 MG TABS Take 5 mg by mouth. PRN     Multiple Vitamins-Minerals (MULTIVITAMIN PO) Take 1 tablet by mouth daily.      Omega-3 Fatty Acids (FISH OIL PO) Take 1 tablet by mouth daily.      No current facility-administered medications for this  visit.    SURGICAL HISTORY:  Past Surgical History:  Procedure Laterality Date   BRONCHIAL BIOPSY  11/28/2019   Procedure: BRONCHIAL BIOPSIES;  Surgeon: Garner Nash, DO;  Location: [facility];  Service: Pulmonary;;   BRONCHIAL BRUSHINGS  11/28/2019   Procedure: BRONCHIAL BRUSHINGS;  Surgeon: Garner Nash, DO;  Location: [facility];  Service: Pulmonary;;   BRONCHIAL NEEDLE ASPIRATION BIOPSY  11/28/2019   Procedure: BRONCHIAL NEEDLE ASPIRATION BIOPSIES;  Surgeon: Garner Nash, DO;  Location: [facility];  Service: Pulmonary;;   BRONCHIAL WASHINGS  11/28/2019   Procedure: BRONCHIAL WASHINGS;  Surgeon: Garner Nash, DO;  Location: [facility];  Service: Pulmonary;;   CESAREAN SECTION     x3   COLONOSCOPY     EXTRACORPOREAL SHOCK WAVE LITHOTRIPSY Right 04/19/2017   Procedure: RIGHT EXTRACORPOREAL SHOCK WAVE LITHOTRIPSY (ESWL);  Surgeon: Ceasar Mons, MD;  [facility];  Service: Urology;  Laterality: Right;   EYE SURGERY  March 2006   Lasix   FIDUCIAL MARKER PLACEMENT  11/28/2019   Procedure: FIDUCIAL MARKER PLACEMENT;  Surgeon: Garner Nash, DO;  Location: [facility];  Service: Pulmonary;;   INTERCOSTAL NERVE BLOCK Right 03/11/2020   Procedure: INTERCOSTAL NERVE BLOCK;  Surgeon: Melrose Nakayama, MD;  Location: [facility];  Service: Thoracic;  Laterality: Right;   LAPAROSCOPIC CHOLECYSTECTOMY  2001   LUMBAR LAMINECTOMY  01/2014   herniated disc  LYMPH NODE DISSECTION N/A 03/11/2020   Procedure: LYMPH NODE DISSECTION;  Surgeon: Melrose Nakayama, MD;  Location: [facility];  Service: Thoracic;  Laterality: N/A;   REFRACTIVE SURGERY Bilateral 2006   lasik   SPINE SURGERY     VIDEO BRONCHOSCOPY WITH ENDOBRONCHIAL NAVIGATION N/A 11/28/2019   Procedure: VIDEO BRONCHOSCOPY WITH ENDOBRONCHIAL NAVIGATION;  Surgeon: Garner Nash, DO;  Location: [facility];  Service: Pulmonary;  Laterality: N/A;   WISDOM TOOTH EXTRACTION     XI ROBOTIC ASSISTED  THORASCOPY - RIGHT LOWER LOBE WEDGE RESECTION (Right)  03/11/2020   path pending. lung nodules    REVIEW OF SYSTEMS:  A comprehensive review of systems was negative.   PHYSICAL EXAMINATION: General appearance: alert, cooperative, and no distress Head: Normocephalic, without obvious abnormality, atraumatic Neck: no adenopathy, no JVD, supple, symmetrical, trachea midline, and thyroid not enlarged, symmetric, no tenderness/mass/nodules Lymph nodes: Cervical, supraclavicular, and axillary nodes normal. Resp: clear to auscultation bilaterally Back: symmetric, no curvature. ROM normal. No CVA tenderness. Cardio: regular rate and rhythm, S1, S2 normal, no murmur, click, rub or gallop GI: soft, non-tender; bowel sounds normal; no masses,  no organomegaly Extremities: extremities normal, atraumatic, no cyanosis or edema  ECOG PERFORMANCE STATUS: 0 - Asymptomatic  Blood pressure 106/65, pulse 69, temperature 98.2 F (36.8 C), temperature source Oral, resp. rate 16, height 5' 7"  (1.702 m), weight 138 lb 8 oz (62.8 kg), last menstrual period 01/12/2014, SpO2 98 %.  LABORATORY DATA: Lab Results  Component Value Date   WBC 2.6 (L) 10/06/2021   HGB 14.2 10/06/2021   HCT 41.4 10/06/2021   MCV 86.8 10/06/2021   PLT 164 10/06/2021      Chemistry      Component Value Date/Time   NA 138 10/06/2021 0918   NA 141 04/18/2019 1357   K 4.2 10/06/2021 0918   CL 104 10/06/2021 0918   CO2 31 10/06/2021 0918   BUN 16 10/06/2021 0918   BUN 12 04/18/2019 1357   CREATININE 0.91 10/06/2021 0918   CREATININE 0.85 02/21/2021 1355      Component Value Date/Time   CALCIUM 9.6 10/06/2021 0918   ALKPHOS 46 10/06/2021 0918   AST 18 10/06/2021 0918   ALT 16 10/06/2021 0918   BILITOT 0.7 10/06/2021 0918       RADIOGRAPHIC STUDIES: DG Chest 1 View  Result Date: 10/07/2021 CLINICAL DATA:  History of MALT lymphoma of the lung. EXAM: CHEST  1 VIEW COMPARISON:  October 07, 2020 FINDINGS: The heart size  and mediastinal contours are within normal limits. Both lungs are clear. Stable clip with surgical sutures is identified in the right lung base. The visualized skeletal structures are stable. IMPRESSION: No active cardiopulmonary disease. Electronically Signed   By: Abelardo Diesel M.D.   On: 10/07/2021 11:00    ASSESSMENT AND PLAN: This is a very pleasant 56 years old white female with extranodal marginal zone lymphoma of mucosa associated lymphoid tissue (MALT lymphoma) status post multiple wedge resection of the right lower lobe on March 11, 2020 under the care of Dr. Roxan Hockey. The patient has been on observation since that time and she is feeling fine with no concerning complaints. Repeat blood work showed mild leukocytopenia but no other abnormalities.  Chest x-ray showed no acute pulmonary findings. I recommended for the patient to continue on observation with repeat CBC, comprehensive metabolic panel, LDH and chest x-ray in 1 year. If she continues to have persistent decline in her leukocyte count, I may consider The  patient for a bone marrow biopsy and aspirate in the future. She was advised to call immediately if she has any other concerning symptoms in the interval. The patient voices understanding of current disease status and treatment options and is in agreement with the current care plan.  All questions were answered. The patient knows to call the clinic with any problems, questions or concerns. We can certainly see the patient much sooner if necessary.  The total time spent in the appointment was 15 minutes.  Disclaimer: This note was dictated with voice recognition software. Similar sounding words can inadvertently be transcribed and may not be corrected upon review.

## 2022-02-22 ENCOUNTER — Encounter: Payer: Self-pay | Admitting: Family Medicine

## 2022-02-22 ENCOUNTER — Ambulatory Visit (INDEPENDENT_AMBULATORY_CARE_PROVIDER_SITE_OTHER): Payer: 59 | Admitting: Family Medicine

## 2022-02-22 VITALS — BP 97/58 | HR 67 | Temp 98.0°F | Ht 67.25 in | Wt 135.0 lb

## 2022-02-22 DIAGNOSIS — Z13220 Encounter for screening for lipoid disorders: Secondary | ICD-10-CM

## 2022-02-22 DIAGNOSIS — Z23 Encounter for immunization: Secondary | ICD-10-CM

## 2022-02-22 DIAGNOSIS — Z131 Encounter for screening for diabetes mellitus: Secondary | ICD-10-CM

## 2022-02-22 DIAGNOSIS — Z0000 Encounter for general adult medical examination without abnormal findings: Secondary | ICD-10-CM | POA: Diagnosis not present

## 2022-02-22 DIAGNOSIS — E559 Vitamin D deficiency, unspecified: Secondary | ICD-10-CM | POA: Diagnosis not present

## 2022-02-22 DIAGNOSIS — R002 Palpitations: Secondary | ICD-10-CM | POA: Diagnosis not present

## 2022-02-22 DIAGNOSIS — C884 Extranodal marginal zone B-cell lymphoma of mucosa-associated lymphoid tissue [MALT-lymphoma]: Secondary | ICD-10-CM | POA: Diagnosis not present

## 2022-02-22 LAB — LIPID PANEL
Cholesterol: 209 mg/dL — ABNORMAL HIGH (ref 0–200)
HDL: 75.9 mg/dL (ref >39.00)
LDL Cholesterol: 122 mg/dL — ABNORMAL HIGH (ref 0–99)
NonHDL: 133.55
Total CHOL/HDL Ratio: 3
Triglycerides: 58 mg/dL (ref 0.0–149.0)
VLDL: 11.6 mg/dL (ref 0.0–40.0)

## 2022-02-22 LAB — COMPREHENSIVE METABOLIC PANEL
ALT: 17 U/L (ref 0–35)
AST: 17 U/L (ref 0–37)
Albumin: 4.5 g/dL (ref 3.5–5.2)
Alkaline Phosphatase: 42 U/L (ref 39–117)
BUN: 17 mg/dL (ref 6–23)
CO2: 31 mEq/L (ref 19–32)
Calcium: 9.2 mg/dL (ref 8.4–10.5)
Chloride: 100 mEq/L (ref 96–112)
Creatinine, Ser: 0.73 mg/dL (ref 0.40–1.20)
GFR: 92.12 mL/min (ref >60.00)
Glucose, Bld: 84 mg/dL (ref 70–99)
Potassium: 4 mEq/L (ref 3.5–5.1)
Sodium: 138 mEq/L (ref 135–145)
Total Bilirubin: 0.6 mg/dL (ref 0.2–1.2)
Total Protein: 6.7 g/dL (ref 6.0–8.3)

## 2022-02-22 LAB — CBC WITH DIFFERENTIAL/PLATELET
Basophils Absolute: 0 10*3/uL (ref 0.0–0.1)
Basophils Relative: 0.8 % (ref 0.0–3.0)
Eosinophils Absolute: 0.1 10*3/uL (ref 0.0–0.7)
Eosinophils Relative: 2.4 % (ref 0.0–5.0)
HCT: 41.6 % (ref 36.0–46.0)
Hemoglobin: 14.1 g/dL (ref 12.0–15.0)
Lymphocytes Relative: 30.3 % (ref 12.0–46.0)
Lymphs Abs: 0.9 10*3/uL (ref 0.7–4.0)
MCHC: 33.9 g/dL (ref 30.0–36.0)
MCV: 86.8 fl (ref 78.0–100.0)
Monocytes Absolute: 0.2 10*3/uL (ref 0.1–1.0)
Monocytes Relative: 7.9 % (ref 3.0–12.0)
Neutro Abs: 1.7 10*3/uL (ref 1.4–7.7)
Neutrophils Relative %: 58.6 % (ref 43.0–77.0)
Platelets: 227 10*3/uL (ref 150.0–400.0)
RBC: 4.79 Mil/uL (ref 3.87–5.11)
RDW: 13.1 % (ref 11.5–15.5)
WBC: 3 10*3/uL — ABNORMAL LOW (ref 4.0–10.5)

## 2022-02-22 LAB — HEMOGLOBIN A1C: Hgb A1c MFr Bld: 5.4 % (ref 4.6–6.5)

## 2022-02-22 LAB — TSH: TSH: 1.13 u[IU]/mL (ref 0.35–5.50)

## 2022-02-22 NOTE — Patient Instructions (Signed)
No follow-ups on file.        Great to see you today.  I have refilled the medication(s) we provide.   If labs were collected, we will inform you of lab results once received either by echart message or telephone call.   - echart message- for normal results that have been seen by the patient already.   - telephone call: abnormal results or if patient has not viewed results in their echart.  

## 2022-02-22 NOTE — Progress Notes (Signed)
Patient ID: Laura Ritter, female  DOB: Mar 22, 1965, 57 y.o.   MRN: [id number] Patient Care Team    Relationship Specialty Notifications Start End  Ma Hillock, DO PCP - General Family Medicine  02/21/21   Irine Seal, MD Attending Physician Urology  01/14/16   Skeet Latch, MD Attending Physician Cardiology  01/14/16    Comment: palpitations  Marshell Garfinkel, MD Consulting Physician Pulmonary Disease  01/14/16    Comment: nodules  Juanita Craver, MD Consulting Physician Gastroenterology  12/12/16   Gerda Diss, DO Consulting Physician Sports Medicine  02/01/18   Nunzio Cobbs, MD Consulting Physician Obstetrics and Gynecology  07/03/19   Garner Nash, DO Consulting Physician Pulmonary Disease  03/24/20   Melrose Nakayama, MD Consulting Physician Cardiothoracic Surgery  03/24/20   Curt Bears, MD Consulting Physician Oncology  02/21/21   Renelda Loma, OD  Optometry  02/21/21     Chief Complaint  Patient presents with   Annual Exam    Pt is fasting     Subjective:  Laura Ritter is a 57 y.o.  female present for  CPE All past medical history, surgical history, allergies, family history, immunizations, medications and social history were updated in the electronic medical record today. All recent labs, ED visits and hospitalizations within the last year were reviewed.  Health maintenance:  Colonoscopy: No fhx. completed 2018, by Dr. Collene Mares- 10 yr follow up Mammogram: No fhx. 03/2021. completed:gyn Cervical cancer screening: last pap: , results: 04/12/2018, completed by: gyn Immunizations: tdap UTD 2018, Influenza 10/2021 (encouraged yearly),  shingrix completed Infectious disease screening: HIV and Hep C completed DEXA: routine screen at 60 Assistive device: none Oxygen [id number] Patient has a Dental home. Hospitalizations/ED visits:     02/22/2022    7:59 AM 02/21/2021    1:36 PM 09/04/2016    2:35 PM  Depression screen PHQ 2/9  Decreased  Interest 0 0 0  Down, Depressed, Hopeless 0 0 0  PHQ - 2 Score 0 0 0       No data to display                  No data to display          Immunization History  Administered Date(s) Administered   Influenza,inj,Quad PF,6+ Mos 10/31/2014, 11/01/2015, 10/27/2016   Influenza-Unspecified 11/19/2017, 11/13/2018, 11/13/2019, 10/25/2020, 10/30/2021   PFIZER Comirnaty(Gray Top)Covid-19 Tri-Sucrose Vaccine 05/18/2020   PFIZER(Purple Top)SARS-COV-2 Vaccination 11/17/2019   Tdap 01/30/2005, 04/05/2016   Unspecified SARS-COV-2 Vaccination 11/03/2021   Zoster Recombinat (Shingrix) 01/06/2021, 03/25/2021    No results found.  Past Medical History:  Diagnosis Date   Chicken pox    Chronic kidney disease 4/09   Kidney stones   History of kidney stones 05/2007   followed yearly follow up- Dr. Jeffie Pollock   Leukopenia    Lymphoma [facility])    Migraine    PVC (premature ventricular contraction) 04/01/2015   anxiety related   No Known Allergies Past Surgical History:  Procedure Laterality Date   BRONCHIAL BIOPSY  11/28/2019   Procedure: BRONCHIAL BIOPSIES;  Surgeon: Garner Nash, DO;  Location: [facility];  Service: Pulmonary;;   BRONCHIAL BRUSHINGS  11/28/2019   Procedure: BRONCHIAL BRUSHINGS;  Surgeon: Garner Nash, DO;  Location: [place] [facility];  Service: Pulmonary;;   BRONCHIAL NEEDLE ASPIRATION BIOPSY  11/28/2019   Procedure: BRONCHIAL NEEDLE ASPIRATION BIOPSIES;  Surgeon: Garner Nash, DO;  Location: [place];  Service:  Pulmonary;;   BRONCHIAL WASHINGS  11/28/2019   Procedure: BRONCHIAL WASHINGS;  Surgeon: Garner Nash, DO;  Location: [place] [facility];  Service: Pulmonary;;   CESAREAN SECTION     x3   COLONOSCOPY     EXTRACORPOREAL SHOCK WAVE LITHOTRIPSY Right 04/19/2017   Procedure: RIGHT EXTRACORPOREAL SHOCK WAVE LITHOTRIPSY (ESWL);  Surgeon: Ceasar Mons, MD;  Location: [facility];  Service: Urology;  Laterality: Right;   EYE SURGERY  March 2006    Lasix   FIDUCIAL MARKER PLACEMENT  11/28/2019   Procedure: FIDUCIAL MARKER PLACEMENT;  Surgeon: Garner Nash, DO;  Location: [place];  Service: Pulmonary;;   INTERCOSTAL NERVE BLOCK Right 03/11/2020   Procedure: INTERCOSTAL NERVE BLOCK;  Surgeon: Melrose Nakayama, MD;  Location: [facility] OR;  Service: Thoracic;  Laterality: Right;   LAPAROSCOPIC CHOLECYSTECTOMY  2001   LUMBAR LAMINECTOMY  01/2014   herniated disc   LYMPH NODE DISSECTION N/A 03/11/2020   Procedure: LYMPH NODE DISSECTION;  Surgeon: Melrose Nakayama, MD;  Location: [place];  Service: Thoracic;  Laterality: N/A;   REFRACTIVE SURGERY Bilateral 2006   lasik   SPINE SURGERY     VIDEO BRONCHOSCOPY WITH ENDOBRONCHIAL NAVIGATION N/A 11/28/2019   Procedure: VIDEO BRONCHOSCOPY WITH ENDOBRONCHIAL NAVIGATION;  Surgeon: Garner Nash, DO;  Location: [place];  Service: Pulmonary;  Laterality: N/A;   WISDOM TOOTH EXTRACTION     XI ROBOTIC ASSISTED THORASCOPY - RIGHT LOWER LOBE WEDGE RESECTION (Right)  03/11/2020   path pending. lung nodules   Family History  Problem Relation Age of Onset   Hyperlipidemia Mother    Alcohol abuse Mother    Arthritis Mother    Alzheimer's disease Mother    Hypertension Father    Parkinson's disease Father    Alcohol abuse Father    Kidney Stones Brother    Alcohol abuse Brother    Dementia Maternal Grandmother    Heart disease Paternal Grandfather    Parkinson's disease Paternal Grandfather    Social History   Social History Narrative   Marital status/children/pets: Married G3P3   Education/employment: Masters degree.  Works as a Manufacturing systems engineer:      -smoke alarm in the home:Yes     - wears seatbelt: Yes     - Feels safe in their relationships: Yes       Allergies as of 02/22/2022   No Known Allergies      Medication List        Accurate as of February 22, 2022  8:25 AM. If you have any questions, ask your nurse or doctor.          Calcium  Carbonate-Vitamin D 600-400 MG-UNIT tablet Take 1 tablet by mouth daily.   Estradiol 10 MCG Tabs vaginal tablet Place 1 tablet (10 mcg total) vaginally 2 (two) times a week.   FISH OIL PO Take 1 tablet by mouth daily.   loratadine 5 MG chewable tablet Commonly known as: CLARITIN Chew 5 mg by mouth daily. PRN   melatonin 5 MG Tabs Take 5 mg by mouth. PRN   MULTIVITAMIN PO Take 1 tablet by mouth daily.        All past medical history, surgical history, allergies, family history, immunizations andmedications were updated in the EMR today and reviewed under the history and medication portions of their EMR.    No results found for this or any previous visit (from the past 2160 hour(s)).   ROS 14 pt review of systems performed  and negative (unless mentioned in an HPI)  Objective: BP (!) 97/58   Pulse 67   Temp 98 F (36.7 C) (Oral)   Ht 5' 7.25" (1.708 m)   Wt 135 lb (61.2 kg)   LMP 01/12/2014 (LMP Unknown)   SpO2 98%   BMI 20.99 kg/m  Physical Exam Vitals and nursing note reviewed.  Constitutional:      General: She is not in acute distress.    Appearance: Normal appearance. She is not ill-appearing or toxic-appearing.  HENT:     Head: Normocephalic and atraumatic.     Right Ear: Tympanic membrane, ear canal and external ear normal. There is no impacted cerumen.     Left Ear: Tympanic membrane, ear canal and external ear normal. There is no impacted cerumen.     Nose: No congestion or rhinorrhea.     Mouth/Throat:     Mouth: Mucous membranes are moist.     Pharynx: Oropharynx is clear. No oropharyngeal exudate or posterior oropharyngeal erythema.  Eyes:     General: No scleral icterus.       Right eye: No discharge.        Left eye: No discharge.     Extraocular Movements: Extraocular movements intact.     Conjunctiva/sclera: Conjunctivae normal.     Pupils: Pupils are equal, round, and reactive to light.  Cardiovascular:     Rate and Rhythm: Normal rate and  regular rhythm.     Pulses: Normal pulses.     Heart sounds: Normal heart sounds. No murmur heard.    No friction rub. No gallop.  Pulmonary:     Effort: Pulmonary effort is normal. No respiratory distress.     Breath sounds: Normal breath sounds. No stridor. No wheezing, rhonchi or rales.  Chest:     Chest wall: No tenderness.  Abdominal:     General: Abdomen is flat. Bowel sounds are normal. There is no distension.     Palpations: Abdomen is soft. There is no mass.     Tenderness: There is no abdominal tenderness. There is no right CVA tenderness, left CVA tenderness, guarding or rebound.     Hernia: No hernia is present.  Musculoskeletal:        General: No swelling, tenderness or deformity. Normal range of motion.     Cervical back: Normal range of motion and neck supple. No rigidity or tenderness.     Right lower leg: No edema.     Left lower leg: No edema.  Lymphadenopathy:     Cervical: No cervical adenopathy.  Skin:    General: Skin is warm and dry.     Coloration: Skin is not jaundiced or pale.     Findings: No bruising, erythema, lesion or rash.  Neurological:     General: No focal deficit present.     Mental Status: She is alert and oriented to person, place, and time. Mental status is at baseline.     Cranial Nerves: No cranial nerve deficit.     Sensory: No sensory deficit.     Motor: No weakness.     Coordination: Coordination normal.     Gait: Gait normal.     Deep Tendon Reflexes: Reflexes normal.  Psychiatric:        Mood and Affect: Mood normal.        Behavior: Behavior normal.        Thought Content: Thought content normal.        Judgment: Judgment normal.  Assessment/plan: GLADYES KUDO is a 57 y.o. female present for cpe Diabetes mellitus screening - Hemoglobin A1c Lipid screening - Lipid panel Mucosa-associated lymphoid tissue (MALT) lymphoma (HCC) - Comprehensive metabolic panel - CBC w/Diff Palpitation - TSH Routine general medical  examination at a health care facility - Comprehensive metabolic panel - Hemoglobin A1c - Lipid panel - TSH - CBC w/Diff Patient was encouraged to exercise greater than 150 minutes a week. Patient was encouraged to choose a diet filled with fresh fruits and vegetables, and lean meats. AVS provided to patient today for education/recommendation on gender specific health and safety maintenance. Colonoscopy: No fhx. completed 2018, by Dr. Collene Mares- 10 yr follow up Mammogram: No fhx. 03/2021. completed:gyn Cervical cancer screening: last pap: , results: 04/12/2018, completed by: gyn Immunizations: tdap UTD 2018, Influenza 10/2021 (encouraged yearly),  shingrix completed Infectious disease screening: HIV and Hep C completed DEXA: routine screen at 60  Return in about 1 year (around 02/24/2023) for cpe (20 min).  Orders Placed This Encounter  Procedures   Comprehensive metabolic panel   Hemoglobin A1c   Lipid panel   TSH   CBC w/Diff   No orders of the defined types were placed in this encounter.  Referral Orders  No referral(s) requested today     Note is dictated utilizing voice recognition software. Although note has been proof read prior to signing, occasional typographical errors still can be missed. If any questions arise, please do not hesitate to call for verification.  Electronically signed by: Howard Pouch, DO [place]

## 2022-03-17 ENCOUNTER — Other Ambulatory Visit: Payer: Self-pay | Admitting: Obstetrics and Gynecology

## 2022-03-17 DIAGNOSIS — Z1231 Encounter for screening mammogram for malignant neoplasm of breast: Secondary | ICD-10-CM

## 2022-05-02 NOTE — Progress Notes (Signed)
57 y.o. G62P3003 Married White or Caucasian Not Hispanic or Latino female here for annual exam.  PMP, no bleeding. Doing well on vaginal estrogen, no dyspareunia.   No bowel or bladder c/o.    H/O MALT lymphoma of the lungs.  She had a right lower lobe lung wedge resection in 2/22. Being followed yearly.   Patient's last menstrual period was 01/12/2014 (lmp unknown).          Sexually active: Yes.    The current method of family planning is post menopausal status.    Exercising: Yes.     Strength training Smoker:  no  Health Maintenance: Pap:   04-12-18 normal, negative hpv; 04/05/16 WNL History of abnormal Pap:  no MMG: 05/03/22 Density B Bi-rads 1 neg  BMD:   n/a Colonoscopy:12-27-16 normal, f/u 10 years.  TDaP:  2018 Gardasil: never   reports that she has never smoked. She has never been exposed to tobacco smoke. She has never used smokeless tobacco. She reports current alcohol use of about 6.0 standard drinks of alcohol per week. She reports that she does not use drugs. 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 3 kids (2 boys, 1 girl): 24, 22 and 21.   Past Medical History:  Diagnosis Date   Chicken pox    Chronic kidney disease 4/09   Kidney stones   History of kidney stones 05/2007   followed yearly follow up- Dr. Annabell Howells   Leukopenia    Lymphoma    Migraine    PVC (premature ventricular contraction) 04/01/2015   anxiety related    Past Surgical History:  Procedure Laterality Date   BRONCHIAL BIOPSY  11/28/2019   Procedure: BRONCHIAL BIOPSIES;  Surgeon: Josephine Igo, DO;  Location: [facility];  Service: Pulmonary;;   BRONCHIAL BRUSHINGS  11/28/2019   Procedure: BRONCHIAL BRUSHINGS;  Surgeon: Josephine Igo, DO;  Location: [facility];  Service: Pulmonary;;   BRONCHIAL NEEDLE ASPIRATION BIOPSY  11/28/2019   Procedure: BRONCHIAL NEEDLE ASPIRATION BIOPSIES;  Surgeon: Josephine Igo, DO;  Location: [facility];  Service: Pulmonary;;   BRONCHIAL WASHINGS   11/28/2019   Procedure: BRONCHIAL WASHINGS;  Surgeon: Josephine Igo, DO;  Location: [facility];  Service: Pulmonary;;   CESAREAN SECTION     x3   COLONOSCOPY     EXTRACORPOREAL SHOCK WAVE LITHOTRIPSY Right 04/19/2017   Procedure: RIGHT EXTRACORPOREAL SHOCK WAVE LITHOTRIPSY (ESWL);  Surgeon: Rene Paci, MD;  Location: [facility];  Service: Urology;  Laterality: Right;   EYE SURGERY  March 2006   Lasix   FIDUCIAL MARKER PLACEMENT  11/28/2019   Procedure: FIDUCIAL MARKER PLACEMENT;  Surgeon: Josephine Igo, DO;  Location: [facility];  Service: Pulmonary;;   INTERCOSTAL NERVE BLOCK Right 03/11/2020   Procedure: INTERCOSTAL NERVE BLOCK;  Surgeon: Loreli Slot, MD;  Location: [facility] OR;  Service: Thoracic;  Laterality: Right;   LAPAROSCOPIC CHOLECYSTECTOMY  2001   LUMBAR LAMINECTOMY  01/2014   herniated disc   LYMPH NODE DISSECTION N/A 03/11/2020   Procedure: LYMPH NODE DISSECTION;  Surgeon: Loreli Slot, MD;  Location: [facility];  Service: Thoracic;  Laterality: N/A;   REFRACTIVE SURGERY Bilateral 2006   lasik   SPINE SURGERY     VIDEO BRONCHOSCOPY WITH ENDOBRONCHIAL NAVIGATION N/A 11/28/2019   Procedure: VIDEO BRONCHOSCOPY WITH ENDOBRONCHIAL NAVIGATION;  Surgeon: Josephine Igo, DO;  Location: [facility];  Service: Pulmonary;  Laterality: N/A;   WISDOM TOOTH EXTRACTION     XI ROBOTIC  ASSISTED THORASCOPY - RIGHT LOWER LOBE WEDGE RESECTION (Right)  03/11/2020   path pending. lung nodules    Current Outpatient Medications  Medication Sig Dispense Refill   Calcium Carbonate-Vitamin D 600-400 MG-UNIT tablet Take 1 tablet by mouth daily.     Estradiol 10 MCG TABS vaginal tablet Place 1 tablet (10 mcg total) vaginally 2 (two) times a week. 24 tablet 4   loratadine (CLARITIN) 5 MG chewable tablet Chew 5 mg by mouth daily. PRN     melatonin 5 MG TABS Take 5 mg by mouth. PRN     Multiple Vitamins-Minerals (MULTIVITAMIN PO) Take 1 tablet by mouth daily.       Omega-3 Fatty Acids (FISH OIL PO) Take 1 tablet by mouth daily.      No current facility-administered medications for this visit.    Family History  Problem Relation Age of Onset   Hyperlipidemia Mother    Alcohol abuse Mother    Arthritis Mother    Alzheimer's disease Mother    Hypertension Father    Parkinson's disease Father    Alcohol abuse Father    Kidney Stones Brother    Alcohol abuse Brother    Dementia Maternal Grandmother    Heart disease Paternal Grandfather    Parkinson's disease Paternal Grandfather     Review of Systems  All other systems reviewed and are negative.   Exam:   BP 110/64   Pulse 73   Ht 5\' 7"  (1.702 m)   Wt 137 lb (62.1 kg)   LMP 01/12/2014 (LMP Unknown)   SpO2 100%   BMI 21.46 kg/m   Weight change: @WEIGHTCHANGE @ Height:   Height: 5\' 7"  (170.2 cm)  Ht Readings from Last 3 Encounters:  05/16/22 5\' 7"  (1.702 m)  02/22/22 5' 7.25" (1.708 m)  10/11/21 5\' 7"  (1.702 m)    General appearance: alert, cooperative and appears stated age Head: Normocephalic, without obvious abnormality, atraumatic Neck: no adenopathy, supple, symmetrical, trachea midline and thyroid normal to inspection and palpation Lungs: clear to auscultation bilaterally Cardiovascular: regular rate and rhythm Breasts: normal appearance, no masses or tenderness Abdomen: soft, non-tender; non distended,  no masses,  no organomegaly Extremities: extremities normal, atraumatic, no cyanosis or edema Skin: Skin color, texture, turgor normal. No rashes or lesions Lymph nodes: Cervical, supraclavicular, and axillary nodes normal. No abnormal inguinal nodes palpated Neurologic: Grossly normal   Pelvic: External genitalia:  no lesions              Urethra:  normal appearing urethra with no masses, tenderness or lesions              Bartholins and Skenes: normal                 Vagina: normal appearing vagina with normal color and discharge, no lesions              Cervix: no  lesions               Bimanual Exam:  Uterus:  normal size, contour, position, consistency, mobility, non-tender              Adnexa: no mass, fullness, tenderness               Rectovaginal: Confirms               Anus:  normal sphincter tone, no lesions  Carolynn Serve, CMA chaperoned for the exam.  1. Well woman exam Discussed breast self exam Discussed calcium  and vit D intake No pap this year Mammogram UTD Colonoscopy is UTD Labs UTD  2. Vaginal atrophy - Estradiol 10 MCG TABS vaginal tablet; Place 1 tablet (10 mcg total) vaginally 2 (two) times a week.  Dispense: 24 tablet; Refill: 4

## 2022-05-03 ENCOUNTER — Ambulatory Visit (INDEPENDENT_AMBULATORY_CARE_PROVIDER_SITE_OTHER): Payer: 59

## 2022-05-03 DIAGNOSIS — Z1231 Encounter for screening mammogram for malignant neoplasm of breast: Secondary | ICD-10-CM

## 2022-05-09 ENCOUNTER — Ambulatory Visit: Payer: No Typology Code available for payment source | Admitting: Obstetrics and Gynecology

## 2022-05-16 ENCOUNTER — Encounter: Payer: Self-pay | Admitting: Obstetrics and Gynecology

## 2022-05-16 ENCOUNTER — Ambulatory Visit (INDEPENDENT_AMBULATORY_CARE_PROVIDER_SITE_OTHER): Payer: 59 | Admitting: Obstetrics and Gynecology

## 2022-05-16 VITALS — BP 110/64 | HR 73 | Ht 67.0 in | Wt 137.0 lb

## 2022-05-16 DIAGNOSIS — N952 Postmenopausal atrophic vaginitis: Secondary | ICD-10-CM | POA: Diagnosis not present

## 2022-05-16 DIAGNOSIS — Z01419 Encounter for gynecological examination (general) (routine) without abnormal findings: Secondary | ICD-10-CM | POA: Diagnosis not present

## 2022-05-16 MED ORDER — ESTRADIOL 10 MCG VA TABS: 1.0000 | ORAL_TABLET | VAGINAL | 4 refills | Status: DC

## 2022-05-16 NOTE — Patient Instructions (Signed)

## 2022-07-15 ENCOUNTER — Other Ambulatory Visit: Payer: Self-pay | Admitting: Obstetrics and Gynecology

## 2022-07-15 DIAGNOSIS — N952 Postmenopausal atrophic vaginitis: Secondary | ICD-10-CM

## 2022-07-17 NOTE — Telephone Encounter (Signed)
Medication refill request: estadiol tab  Last AEX:  05/18/22  Next AEX: not scheduled  Last MMG (if hormonal medication request): 05/05/22 normal  Refill authorized: refill for one year sent to [facility] in [facility] 05/18/22

## 2022-09-12 ENCOUNTER — Encounter: Payer: Self-pay | Admitting: Internal Medicine

## 2022-09-14 ENCOUNTER — Encounter: Payer: Self-pay | Admitting: Medical Oncology

## 2022-09-14 DIAGNOSIS — C884 Extranodal marginal zone B-cell lymphoma of mucosa-associated lymphoid tissue [MALT-lymphoma]: Secondary | ICD-10-CM

## 2022-09-18 ENCOUNTER — Ambulatory Visit: Payer: 59 | Admitting: Family Medicine

## 2022-09-18 ENCOUNTER — Encounter: Payer: Self-pay | Admitting: Family Medicine

## 2022-09-18 VITALS — BP 109/70 | HR 62 | Temp 98.0°F | Ht 67.0 in | Wt 137.6 lb

## 2022-09-18 DIAGNOSIS — R001 Bradycardia, unspecified: Secondary | ICD-10-CM | POA: Diagnosis not present

## 2022-09-18 DIAGNOSIS — I499 Cardiac arrhythmia, unspecified: Secondary | ICD-10-CM

## 2022-09-18 NOTE — Patient Instructions (Addendum)
Return if symptoms worsen or fail to improve. I placed a referral to your cardiologist for evaluation.        Great to see you today.  I have refilled the medication(s) we provide.   If labs were collected or images ordered, we will inform you of  results once we have received them and reviewed. We will contact you either by echart message, or telephone call.  Please give ample time to the testing facility, and our office to run,  receive and review results. Please do not call inquiring of results, even if you can see them in your chart. We will contact you as soon as we are able. If it has been over 1 week since the test was completed, and you have not yet heard from [place], then please call us.    - echart message- for normal results that have been seen by the patient already.   - telephone call: abnormal results or if patient has not viewed results in their echart.  If a referral to a specialist was entered for you, please call us in 2 weeks if you have not heard from the specialist office to schedule.

## 2022-09-18 NOTE — Progress Notes (Signed)
Laura Ritter , 03-18-1965, 57 y.o., female MRN: [id number] Patient Care Team    Relationship Specialty Notifications Start End  Natalia Leatherwood, DO PCP - General Family Medicine  02/21/21   Bjorn Pippin, MD Attending Physician Urology  01/14/16   Chilton Si, MD Attending Physician Cardiology  01/14/16    Comment: palpitations  Chilton Greathouse, MD Consulting Physician Pulmonary Disease  01/14/16    Comment: nodules  Charna Elizabeth, MD Consulting Physician Gastroenterology  12/12/16   Andrena Mews, DO Consulting Physician Sports Medicine  02/01/18   Patton Salles, MD Consulting Physician Obstetrics and Gynecology  07/03/19   Josephine Igo, DO Consulting Physician Pulmonary Disease  03/24/20   Loreli Slot, MD Consulting Physician Cardiothoracic Surgery  03/24/20   Si Gaul, MD Consulting Physician Oncology  02/21/21   Tanya Nones, OD  Optometry  02/21/21     Chief Complaint  Patient presents with   Low Heart Rate    Pt reports low HR below 40 yesterday from apple watch, felt really tired as well but did not last long. Denies chest pain or shortness of breath     Subjective: Laura Ritter is a 57 y.o. Pt presents for an OV with complaints of low HR 40 yesterday. Associated symptoms include fatigue. Pt reports it returned to normal pretty quickly. Episode lasted about 10 minutes.  She denies feeling palpitations.  HR typically 58-73. No new medications or illness.  She is established with cardiology.     09/18/2022   10:42 AM 02/22/2022    7:59 AM 02/21/2021    1:36 PM 09/04/2016    2:35 PM  Depression screen PHQ 2/9  Decreased Interest 0 0 0 0  Down, Depressed, Hopeless 0 0 0 0  PHQ - 2 Score 0 0 0 0    No Known Allergies Social History   Social History Narrative   Marital status/children/pets: Married G3P3   Education/employment: Masters degree.  Works as a [employer]ologist:      -smoke alarm in the home:Yes     -  wears seatbelt: Yes     - Feels safe in their relationships: Yes      Past Medical History:  Diagnosis Date   Chicken pox    Chronic kidney disease 4/09   Kidney stones   History of kidney stones 05/2007   followed yearly follow up- Dr. Annabell Howells   Leukopenia    Lymphoma (HCC)    Migraine    PVC (premature ventricular contraction) 04/01/2015   anxiety related   Past Surgical History:  Procedure Laterality Date   BRONCHIAL BIOPSY  11/28/2019   Procedure: BRONCHIAL BIOPSIES;  Surgeon: Josephine Igo, DO;  Location: [facility];  Service: Pulmonary;;   BRONCHIAL BRUSHINGS  11/28/2019   Procedure: BRONCHIAL BRUSHINGS;  Surgeon: Josephine Igo, DO;  Location: [facility];  Service: Pulmonary;;   BRONCHIAL NEEDLE ASPIRATION BIOPSY  11/28/2019   Procedure: BRONCHIAL NEEDLE ASPIRATION BIOPSIES;  Surgeon: Josephine Igo, DO;  Location: [facility];  Service: Pulmonary;;   BRONCHIAL WASHINGS  11/28/2019   Procedure: BRONCHIAL WASHINGS;  Surgeon: Josephine Igo, DO;  Location: [facility];  Service: Pulmonary;;   CESAREAN SECTION     x3   COLONOSCOPY     EXTRACORPOREAL SHOCK WAVE LITHOTRIPSY Right 04/19/2017   Procedure: RIGHT EXTRACORPOREAL SHOCK WAVE LITHOTRIPSY (ESWL);  Surgeon: Rene Paci, MD;  Location: [facility];  Service: Urology;  Laterality: Right;   EYE SURGERY  March 2006   Lasix   FIDUCIAL MARKER PLACEMENT  11/28/2019   Procedure: FIDUCIAL MARKER PLACEMENT;  Surgeon: Josephine Igo, DO;  [facility];  Service: Pulmonary;;   INTERCOSTAL NERVE BLOCK Right 03/11/2020   Procedure: INTERCOSTAL NERVE BLOCK;  Surgeon: Loreli Slot, MD;  Location: [facility] OR;  Service: Thoracic;  Laterality: Right;   LAPAROSCOPIC CHOLECYSTECTOMY  2001   LUMBAR LAMINECTOMY  01/2014   herniated disc   LYMPH NODE DISSECTION N/A 03/11/2020   Procedure: LYMPH NODE DISSECTION;  Surgeon: Loreli Slot, MD;  Location: [facility];  Service: Thoracic;  Laterality: N/A;    REFRACTIVE SURGERY Bilateral 2006   lasik   SPINE SURGERY     VIDEO BRONCHOSCOPY WITH ENDOBRONCHIAL NAVIGATION N/A 11/28/2019   Procedure: VIDEO BRONCHOSCOPY WITH ENDOBRONCHIAL NAVIGATION;  Surgeon: Josephine Igo, DO;  Location: [facility];  Service: Pulmonary;  Laterality: N/A;   WISDOM TOOTH EXTRACTION     XI ROBOTIC ASSISTED THORASCOPY - RIGHT LOWER LOBE WEDGE RESECTION (Right)  03/11/2020   path pending. lung nodules   Family History  Problem Relation Age of Onset   Hyperlipidemia Mother    Alcohol abuse Mother    Arthritis Mother    Alzheimer's disease Mother    Hypertension Father    Parkinson's disease Father    Alcohol abuse Father    Kidney Stones Brother    Alcohol abuse Brother    Dementia Maternal Grandmother    Heart disease Paternal Grandfather    Parkinson's disease Paternal Grandfather    Allergies as of 09/18/2022   No Known Allergies      Medication List        Accurate as of September 18, 2022 11:11 AM. If you have any questions, ask your nurse or doctor.          Calcium Carbonate-Vitamin D 600-400 MG-UNIT tablet Take 1 tablet by mouth daily.   Estradiol 10 MCG Tabs vaginal tablet Place 1 tablet (10 mcg total) vaginally 2 (two) times a week.   FISH OIL PO Take 1 tablet by mouth daily.   loratadine 5 MG chewable tablet Commonly known as: CLARITIN Chew 5 mg by mouth daily. PRN   melatonin 5 MG Tabs Take 5 mg by mouth. PRN   MULTIVITAMIN PO Take 1 tablet by mouth daily.        All past medical history, surgical history, allergies, family history, immunizations andmedications were updated in the EMR today and reviewed under the history and medication portions of their EMR.     Review of Systems  Constitutional:  Positive for malaise/fatigue. Negative for chills and fever.  Respiratory:  Negative for shortness of breath.   Cardiovascular:  Negative for chest pain.  Neurological:  Negative for dizziness and headaches.   Negative,  with the exception of above mentioned in HPI   Objective:  BP 109/70   Pulse 62   Temp 98 F (36.7 C)   Ht 5\' 7"  (1.702 m)   Wt 137 lb 9.6 oz (62.4 kg)   LMP 01/12/2014 (LMP Unknown)   SpO2 97%   BMI 21.55 kg/m  Body mass index is 21.55 kg/m. Physical Exam Vitals and nursing note reviewed.  Constitutional:      General: She is not in acute distress.    Appearance: Normal appearance. She is not ill-appearing, toxic-appearing or diaphoretic.  HENT:     Head: Normocephalic and atraumatic.  Eyes:     General:  No scleral icterus.       Right eye: No discharge.        Left eye: No discharge.     Extraocular Movements: Extraocular movements intact.     Conjunctiva/sclera: Conjunctivae normal.     Pupils: Pupils are equal, round, and reactive to light.  Cardiovascular:     Rate and Rhythm: Normal rate and regular rhythm.     Heart sounds: No murmur heard. Pulmonary:     Effort: Pulmonary effort is normal. No respiratory distress.     Breath sounds: Normal breath sounds. No wheezing, rhonchi or rales.  Musculoskeletal:     Right lower leg: No edema.     Left lower leg: No edema.  Skin:    General: Skin is warm.     Findings: No rash.  Neurological:     Mental Status: She is alert and oriented to person, place, and time. Mental status is at baseline.     Motor: No weakness.     Gait: Gait normal.  Psychiatric:        Mood and Affect: Mood normal.        Behavior: Behavior normal.        Thought Content: Thought content normal.        Judgment: Judgment normal.   AVW:[id number]. HR 56. NF621. QTC 427. Occ. Ectopic beats No results found. No results found. No results found for this or any previous visit (from the past 24 hour(s)).  Assessment/Plan: Laura Ritter is a 57 y.o. female present for OV for  Bradycardia/Irregular heart beat Suspect ectopic beats may be throwing off BPM on watch, she is asymptomatic overall. Discussed referral back to cardio to be certain  and she is agreeable. - EKG 12-Lead>bradycardia. HR 56. HY865. QTC 427. Occ. Ectopic beats - CBC w/Diff - TSH - Ambulatory referral to Cardiology- zio monitor may be helpful   Reviewed expectations re: course of current medical issues. Discussed self-management of symptoms. Outlined signs and symptoms indicating need for more acute intervention. Patient verbalized understanding and all questions were answered. Patient received an After-Visit Summary.    Orders Placed This Encounter  Procedures   Ambulatory referral to Cardiology   EKG 12-Lead   No orders of the defined types were placed in this encounter.  Referral Orders         Ambulatory referral to Cardiology       Note is dictated utilizing voice recognition software. Although note has been proof read prior to signing, occasional typographical errors still can be missed. If any questions arise, please do not hesitate to call for verification.   electronically signed by:  Felix Pacini, DO  [facility] - OR

## 2022-09-26 ENCOUNTER — Telehealth: Payer: Self-pay | Admitting: Internal Medicine

## 2022-09-26 NOTE — Telephone Encounter (Signed)
Rescheduled 09/11 appointments due to provider on-call. Patient has been called and voicemail was left.

## 2022-10-03 ENCOUNTER — Ambulatory Visit
Admission: RE | Admit: 2022-10-03 | Discharge: 2022-10-03 | Disposition: A | Discharge status: Home / Self Care | Payer: Self-pay | Source: Ambulatory Visit | Attending: Internal Medicine | Admitting: Internal Medicine

## 2022-10-03 DIAGNOSIS — C884 Extranodal marginal zone B-cell lymphoma of mucosa-associated lymphoid tissue [MALT-lymphoma]: Secondary | ICD-10-CM

## 2022-10-09 ENCOUNTER — Inpatient Hospital Stay: Payer: 59

## 2022-10-09 ENCOUNTER — Other Ambulatory Visit: Payer: No Typology Code available for payment source

## 2022-10-11 ENCOUNTER — Inpatient Hospital Stay: Payer: 59 | Admitting: Internal Medicine

## 2022-10-12 ENCOUNTER — Encounter: Payer: Self-pay | Admitting: Family Medicine

## 2022-10-17 ENCOUNTER — Other Ambulatory Visit: Payer: Self-pay | Admitting: *Deleted

## 2022-10-17 DIAGNOSIS — C884 Extranodal marginal zone B-cell lymphoma of mucosa-associated lymphoid tissue [MALT-lymphoma]: Secondary | ICD-10-CM

## 2022-10-18 ENCOUNTER — Inpatient Hospital Stay: Payer: 59 | Attending: Internal Medicine

## 2022-10-18 DIAGNOSIS — C884 Extranodal marginal zone B-cell lymphoma of mucosa-associated lymphoid tissue [MALT-lymphoma]: Secondary | ICD-10-CM | POA: Diagnosis present

## 2022-10-18 DIAGNOSIS — R002 Palpitations: Secondary | ICD-10-CM | POA: Insufficient documentation

## 2022-10-18 DIAGNOSIS — R001 Bradycardia, unspecified: Secondary | ICD-10-CM | POA: Insufficient documentation

## 2022-10-18 LAB — LACTATE DEHYDROGENASE: LDH: 183 U/L (ref 98–192)

## 2022-10-18 LAB — CBC WITH DIFFERENTIAL (CANCER CENTER ONLY)
Abs Immature Granulocytes: 0 10*3/uL (ref 0.00–0.07)
Basophils Absolute: 0 10*3/uL (ref 0.0–0.1)
Basophils Relative: 1 %
Eosinophils Absolute: 0.1 10*3/uL (ref 0.0–0.5)
Eosinophils Relative: 2 %
HCT: 45 % (ref 36.0–46.0)
Hemoglobin: 15.1 g/dL — ABNORMAL HIGH (ref 12.0–15.0)
Immature Granulocytes: 0 %
Lymphocytes Relative: 22 %
Lymphs Abs: 0.7 10*3/uL (ref 0.7–4.0)
MCH: 29.5 pg (ref 26.0–34.0)
MCHC: 33.6 g/dL (ref 30.0–36.0)
MCV: 88.1 fL (ref 80.0–100.0)
Monocytes Absolute: 0.2 10*3/uL (ref 0.1–1.0)
Monocytes Relative: 7 %
Neutro Abs: 2.2 10*3/uL (ref 1.7–7.7)
Neutrophils Relative %: 68 %
Platelet Count: 162 10*3/uL (ref 150–400)
RBC: 5.11 MIL/uL (ref 3.87–5.11)
RDW: 12.7 % (ref 11.5–15.5)
WBC Count: 3.3 10*3/uL — ABNORMAL LOW (ref 4.0–10.5)
nRBC: 0 % (ref 0.0–0.2)

## 2022-10-18 LAB — CMP (CANCER CENTER ONLY)
ALT: 22 U/L (ref 0–44)
AST: 23 U/L (ref 15–41)
Albumin: 5 g/dL (ref 3.5–5.0)
Alkaline Phosphatase: 51 U/L (ref 38–126)
Anion gap: 6 (ref 5–15)
BUN: 18 mg/dL (ref 6–20)
CO2: 31 mmol/L (ref 22–32)
Calcium: 9.7 mg/dL (ref 8.9–10.3)
Chloride: 103 mmol/L (ref 98–111)
Creatinine: 0.9 mg/dL (ref 0.44–1.00)
GFR, Estimated: >60 mL/min (ref >60)
Glucose, Bld: 97 mg/dL (ref 70–99)
Potassium: 4.1 mmol/L (ref 3.5–5.1)
Sodium: 140 mmol/L (ref 135–145)
Total Bilirubin: 0.7 mg/dL (ref 0.3–1.2)
Total Protein: 7.8 g/dL (ref 6.5–8.1)

## 2022-10-25 ENCOUNTER — Inpatient Hospital Stay (HOSPITAL_BASED_OUTPATIENT_CLINIC_OR_DEPARTMENT_OTHER): Payer: 59 | Admitting: Internal Medicine

## 2022-10-25 ENCOUNTER — Other Ambulatory Visit (HOSPITAL_BASED_OUTPATIENT_CLINIC_OR_DEPARTMENT_OTHER): Payer: Self-pay

## 2022-10-25 VITALS — BP 109/67 | HR 66 | Temp 97.8°F | Resp 18 | Ht 67.0 in | Wt 140.5 lb

## 2022-10-25 DIAGNOSIS — C884 Extranodal marginal zone B-cell lymphoma of mucosa-associated lymphoid tissue [MALT-lymphoma]: Secondary | ICD-10-CM | POA: Diagnosis not present

## 2022-10-25 MED ORDER — FLULAVAL 0.5 ML IM SUSY
PREFILLED_SYRINGE | INTRAMUSCULAR | 0 refills | Status: DC
  Filled 2022-10-25: qty 0.5, 1d supply, fill #0

## 2022-10-25 NOTE — Progress Notes (Signed)
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 Dorris Fetch.  CURRENT THERAPY: Observation  INTERVAL HISTORY: Laura Ritter 57 y.o. female returns to the clinic today for annual follow-up visit. Discussed the use of AI scribe software for clinical note transcription with the patient, who gave verbal consent to proceed.  History of Present Illness   Laura Ritter, a 57 year old with a history of extranodal marginal zone lymphoma, presents with recent episodes of bradycardia and irregular heart rhythm. Over the past two months, she has experienced episodes of heart rate dropping below 40 bpm for more than 10 minutes, as alerted by her watch. She describes a sensation of missed beats and irregular rhythm. She has also noticed increased exertion on climbing stairs at work, often needing to take deep sighs.  In the past, she has seen a cardiologist for palpitations, which were evaluated with an echocardiogram and found to be benign. 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 She has noticed a slight increase in her blood pressure, which is typically around 90 mmHg.  Her lymphoma was surgically treated with multiple lung wedge resections in February 2022. Recent lab work, including CBC, comprehensive metabolic panel, and LDH, as well as a chest x-ray, were all normal. She denies any respiratory symptoms such as coughing, chest pain, or hemoptysis, and also denies any gastrointestinal symptoms like nausea or vomiting. She has not experienced any recent weight loss.        MEDICAL HISTORY: Past Medical History:  Diagnosis Date   Chicken pox    Chronic kidney disease 4/09   Kidney stones   History of kidney stones 05/2007   followed yearly follow up- Dr. Annabell Howells   Leukopenia    Lymphoma [facility])    Migraine    PVC (premature ventricular contraction) 04/01/2015   anxiety related    ALLERGIES:  has No Known Allergies.  MEDICATIONS:  Current Outpatient Medications  Medication Sig Dispense Refill   Calcium Carbonate-Vitamin D 600-400 MG-UNIT tablet Take 1 tablet by mouth daily.     Estradiol 10 MCG TABS vaginal tablet Place 1 tablet (10 mcg total) vaginally 2 (two) times a week. 24 tablet 4   influenza vac split trivalent PF (FLULAVAL) 0.5 ML injection Inject into the muscle. 0.5 mL 0   loratadine (CLARITIN) 5 MG chewable tablet Chew 5 mg by mouth daily. PRN     melatonin 5 MG TABS Take 5 mg by mouth. PRN     Multiple Vitamins-Minerals (MULTIVITAMIN PO) Take 1 tablet by mouth daily.      Omega-3 Fatty Acids (FISH OIL PO) Take 1 tablet by mouth daily.      No current facility-administered medications for this visit.    SURGICAL HISTORY:  Past Surgical History:  Procedure Laterality Date   BRONCHIAL BIOPSY  11/28/2019   Procedure: BRONCHIAL BIOPSIES;  Surgeon: Josephine Igo, DO;  Location: [facility];  Service: Pulmonary;;   BRONCHIAL BRUSHINGS  11/28/2019   Procedure: BRONCHIAL BRUSHINGS;  Surgeon: Josephine Igo, DO;  [facility]: [facility];  Service: Pulmonary;;   BRONCHIAL NEEDLE ASPIRATION BIOPSY  11/28/2019   Procedure: BRONCHIAL NEEDLE ASPIRATION BIOPSIES;  Surgeon: Josephine Igo, DO;  Location: [facility];  Service: Pulmonary;;   BRONCHIAL WASHINGS  11/28/2019   Procedure: BRONCHIAL WASHINGS;  Surgeon: Josephine Igo, DO;  Location: [facility];  Service: Pulmonary;;   CESAREAN SECTION     x3   COLONOSCOPY     EXTRACORPOREAL SHOCK WAVE LITHOTRIPSY Right 04/19/2017   Procedure: RIGHT EXTRACORPOREAL SHOCK WAVE LITHOTRIPSY  (ESWL);  Surgeon: Rene Paci, MD;  Location: [facility];  Service: Urology;  Laterality: Right;   EYE SURGERY  March 2006   Lasix   FIDUCIAL MARKER PLACEMENT  11/28/2019   Procedure: FIDUCIAL MARKER PLACEMENT;  Surgeon: Josephine Igo, DO;  [facility];  Service: Pulmonary;;   INTERCOSTAL NERVE BLOCK Right 03/11/2020   Procedure: INTERCOSTAL NERVE BLOCK;  Surgeon: Loreli Slot, MD;  Location: [facility] OR;  Service: Thoracic;  Laterality: Right;   LAPAROSCOPIC CHOLECYSTECTOMY  2001   LUMBAR LAMINECTOMY  01/2014   herniated disc   LYMPH NODE DISSECTION N/A 03/11/2020   Procedure: LYMPH NODE DISSECTION;  Surgeon: Loreli Slot, MD;  [facility];  Service: Thoracic;  Laterality: N/A;   REFRACTIVE SURGERY Bilateral 2006   lasik   SPINE SURGERY     VIDEO BRONCHOSCOPY WITH ENDOBRONCHIAL NAVIGATION N/A 11/28/2019   Procedure: VIDEO BRONCHOSCOPY WITH ENDOBRONCHIAL NAVIGATION;  Surgeon: Josephine Igo, DO;  [facility]: [facility];  Service: Pulmonary;  Laterality: N/A;   WISDOM TOOTH EXTRACTION     XI ROBOTIC ASSISTED THORASCOPY - RIGHT LOWER LOBE WEDGE RESECTION (Right)  03/11/2020   path pending. lung nodules    REVIEW OF SYSTEMS:  A comprehensive review of systems was negative.   PHYSICAL EXAMINATION: General appearance: alert, cooperative, and no distress Head: Normocephalic, without obvious abnormality, atraumatic Neck: no adenopathy, no JVD, supple, symmetrical, trachea midline, and thyroid not enlarged, symmetric, no tenderness/mass/nodules Lymph nodes: Cervical, supraclavicular, and axillary nodes normal. Resp: clear to auscultation bilaterally Back: symmetric, no curvature. ROM normal. No CVA tenderness. Cardio: regular rate and rhythm, S1, S2 normal, no murmur, click, rub or gallop GI: soft, non-tender; bowel sounds normal; no masses,  no organomegaly Extremities: extremities normal, atraumatic, no cyanosis or edema  ECOG PERFORMANCE STATUS:  0 - Asymptomatic  Blood pressure 109/67, pulse 66, temperature 97.8 F (36.6 C), temperature source Oral, resp. rate 18, height 5\' 7"  (1.702 m), weight 140 lb 8 oz (63.7 kg), last menstrual period 01/12/2014, SpO2 99%.  LABORATORY DATA: Lab Results  Component Value Date   WBC 3.3 (L) 10/18/2022   HGB 15.1 (H) 10/18/2022   HCT 45.0 10/18/2022   MCV 88.1 10/18/2022   PLT 162 10/18/2022      Chemistry      Component Value Date/Time   NA 140 10/18/2022 0843   NA 141 04/18/2019 1357   K 4.1 10/18/2022 0843   CL 103 10/18/2022 0843   CO2 31 10/18/2022 0843   BUN 18 10/18/2022 0843   BUN 12 04/18/2019 1357   CREATININE 0.90 10/18/2022 0843   CREATININE 0.85 02/21/2021 1355      Component Value Date/Time   CALCIUM 9.7 10/18/2022 0843   ALKPHOS 51 10/18/2022 0843   AST 23 10/18/2022 0843   ALT 22 10/18/2022 0843   BILITOT 0.7 10/18/2022 0843       RADIOGRAPHIC STUDIES: DG Chest 1 View  Result Date: 10/10/2022 CLINICAL DATA:  MALT lymphoma EXAM: CHEST  1 VIEW COMPARISON:  10/06/2021. FINDINGS: Cardiac silhouette is unremarkable.  No pneumothorax or pleural effusion. The visualized skeletal structures are unremarkable. Focal density at the right base with a metallic clip is noted to be stable compared to the most recent prior study. Lungs are otherwise clear. IMPRESSION: Stable nodular density with a associated metallic clip right base. Otherwise no acute cardiopulmonary process. Electronically Signed   By: Layla Maw M.D.   On: 10/10/2022 12:06    ASSESSMENT AND PLAN: This is a very pleasant 57 years old white female with extranodal marginal zone lymphoma of mucosa associated lymphoid tissue (MALT lymphoma) status post multiple wedge resection of the right lower lobe on March 11, 2020 under the care of Dr. Dorris Fetch. The patient has been on observation since that time and she is feeling fine with no concerning complaints. Assessment and Plan    Extranodal Marginal Zone  Lymphoma Stable since surgery in February 2022. Labs and chest x-ray are normal. No new symptoms reported. -Continue current management and follow-up in 1 year with chest x-ray and labs.  Bradycardia and Palpitations New onset of low heart rate and palpitations. Patient has been referred to a cardiologist. -Cardiology consultation next week.  General Health Maintenance -Continue regular mammograms as per guidelines.   She was advised to call immediately if she has any concerning symptoms in the interval.  The patient voices understanding of current disease status and treatment options and is in agreement with the current care plan.  All questions were answered. The patient knows to call the clinic with any problems, questions or concerns. We can certainly see the patient much sooner if necessary.  The total time spent in the appointment was 20 minutes.  Disclaimer: This note was dictated with voice recognition software. Similar sounding words can inadvertently be transcribed and may not be corrected upon review.

## 2022-10-30 ENCOUNTER — Other Ambulatory Visit: Payer: Self-pay

## 2022-10-30 DIAGNOSIS — D72819 Decreased white blood cell count, unspecified: Secondary | ICD-10-CM | POA: Insufficient documentation

## 2022-10-30 DIAGNOSIS — N189 Chronic kidney disease, unspecified: Secondary | ICD-10-CM | POA: Insufficient documentation

## 2022-10-30 DIAGNOSIS — B019 Varicella without complication: Secondary | ICD-10-CM | POA: Insufficient documentation

## 2022-10-30 DIAGNOSIS — C8590 Non-Hodgkin lymphoma, unspecified, unspecified site: Secondary | ICD-10-CM | POA: Insufficient documentation

## 2022-10-30 DIAGNOSIS — G43909 Migraine, unspecified, not intractable, without status migrainosus: Secondary | ICD-10-CM | POA: Insufficient documentation

## 2022-11-02 ENCOUNTER — Ambulatory Visit: Payer: 59

## 2022-11-02 VITALS — BP 98/60 | HR 77 | Ht 67.0 in | Wt 139.0 lb

## 2022-11-02 DIAGNOSIS — R002 Palpitations: Secondary | ICD-10-CM

## 2022-11-02 DIAGNOSIS — R001 Bradycardia, unspecified: Secondary | ICD-10-CM

## 2022-11-02 DIAGNOSIS — I493 Ventricular premature depolarization: Secondary | ICD-10-CM | POA: Diagnosis not present

## 2022-11-02 NOTE — Patient Instructions (Signed)
Medication Instructions:  Your physician recommends that you continue on your current medications as directed. Please refer to the Current Medication list given to you today.  *If you need a refill on your cardiac medications before your next appointment, please call your pharmacy*   Lab Work: None ordered If you have labs (blood work) drawn today and your tests are completely normal, you will receive your results only by: MyChart Message (if you have MyChart) OR A paper copy in the mail If you have any lab test that is abnormal or we need to change your treatment, we will call you to review the results.   Testing/Procedures: Your physician has requested that you have an echocardiogram. Echocardiography is a painless test that uses sound waves to create images of your heart. It provides your doctor with information about the size and shape of your heart and how well your heart's chambers and valves are working. This procedure takes approximately one hour. There are no restrictions for this procedure. Please do NOT wear cologne, perfume, aftershave, or lotions (deodorant is allowed). Please arrive 15 minutes prior to your appointment time.  A zio monitor was ordered today. It will remain on for 3 days. Remove 11/05/2022 after 4:30 pm. You will then return monitor and event diary in provided box. It takes 1-2 weeks for report to be downloaded and returned to [place]. We will call you with the results. If monitor falls off or has orange flashing light, please call Zio for further instructions.    Follow-Up: At [facility], you and your health needs are our priority.  As part of our continuing mission to provide you with exceptional heart care, we have created designated Provider Care Teams.  These Care Teams include your primary Cardiologist (physician) and Advanced Practice Providers (APPs -  Physician Assistants and Nurse Practitioners) who all work together to provide you with the care you need,  when you need it.  We recommend signing up for the patient portal called "MyChart".  Sign up information is provided on this After Visit Summary.  MyChart is used to connect with patients for Virtual Visits (Telemedicine).  Patients are able to view lab/test results, encounter notes, upcoming appointments, etc.  Non-urgent messages can be sent to your provider as well.   To learn more about what you can do with MyChart, go to [email or web address].    Your next appointment:   6 month(s)  The format for your next appointment:   In Person  Provider:   Vern Claude Madireddy, MD   Other Instructions Echocardiogram An echocardiogram is a test that uses sound waves (ultrasound) to produce images of the heart. Images from an echocardiogram can provide important information about: Heart size and shape. The size and thickness and movement of your heart's walls. Heart muscle function and strength. Heart valve function or if you have stenosis. Stenosis is when the heart valves are too narrow. If blood is flowing backward through the heart valves (regurgitation). A tumor or infectious growth around the heart valves. Areas of heart muscle that are not working well because of poor blood flow or injury from a heart attack. Aneurysm detection. An aneurysm is a weak or damaged part of an artery wall. The wall bulges out from the normal force of blood pumping through the body. Tell a health care provider about: Any allergies you have. All medicines you are taking, including vitamins, herbs, eye drops, creams, and over-the-counter medicines. Any blood disorders you have. Any surgeries you have had.  Any medical conditions you have. Whether you are pregnant or may be pregnant. What are the risks? Generally, this is a safe test. However, problems may occur, including an allergic reaction to dye (contrast) that may be used during the test. What happens before the test? No specific preparation is needed.  You may eat and drink normally. What happens during the test? You will take off your clothes from the waist up and put on a hospital gown. Electrodes or electrocardiogram (ECG)patches may be placed on your chest. The electrodes or patches are then connected to a device that monitors your heart rate and rhythm. You will lie down on a table for an ultrasound exam. A gel will be applied to your chest to help sound waves pass through your skin. A handheld device, called a transducer, will be pressed against your chest and moved over your heart. The transducer produces sound waves that travel to your heart and bounce back (or "echo" back) to the transducer. These sound waves will be captured in real-time and changed into images of your heart that can be viewed on a video monitor. The images will be recorded on a computer and reviewed by your health care provider. You may be asked to change positions or hold your breath for a short time. This makes it easier to get different views or better views of your heart. In some cases, you may receive contrast through an IV in one of your veins. This can improve the quality of the pictures from your heart. The procedure may vary among health care providers and hospitals.   What can I expect after the test? You may return to your normal, everyday life, including diet, activities, and medicines, unless your health care provider tells you not to do that. Follow these instructions at home: It is up to you to get the results of your test. Ask your health care provider, or the department that is doing the test, when your results will be ready. Keep all follow-up visits. This is important. Summary An echocardiogram is a test that uses sound waves (ultrasound) to produce images of the heart. Images from an echocardiogram can provide important information about the size and shape of your heart, heart muscle function, heart valve function, and other possible heart  problems. You do not need to do anything to prepare before this test. You may eat and drink normally. After the echocardiogram is completed, you may return to your normal, everyday life, unless your health care provider tells you not to do that. This information is not intended to replace advice given to you by your health care provider. Make sure you discuss any questions you have with your health care provider. Document Revised: 09/09/2019 Document Reviewed: 09/09/2019 Elsevier Patient Education  2021 [employer]   Important Information About Sugar

## 2022-11-02 NOTE — Assessment & Plan Note (Addendum)
Her symptoms appear to correlate with frequent ventricular ectopy identified. Will obtain 3-day Zio patch to assess ventricular ectopy burden. If not significant remaining below 5% her symptoms are minimal, can hold off on any medication regimen.  If she does have significant burden of ventricular ectopy or any runs of ventricular ectopy, will consider low-dose beta-blockers.  Her blood pressure would be somewhat of a limiting factor given her systolic blood pressure runs in the l upper 90s to low 100s.  Will obtain a transthoracic echocardiogram to rule out any structural issues with the heart.

## 2022-11-02 NOTE — Progress Notes (Signed)
Cardiology Consultation:    Date:  11/02/2022   ID:  Laura Ritter, DOB 14-Nov-1965, MRN [id number]  PCP:  Natalia Leatherwood, DO  Cardiologist:  Luretha Murphy, MD   Referring MD: Natalia Leatherwood, DO   No chief complaint on file.   ASSESSMENT AND PLAN:   Mr. Laura Ritter 57/f with h/o MALT lymphoma of the lung, previously minor symptoms with ventricular ectopy, now coming in for symptoms of slow heartbeat associated with palpitations correlating once again with frequent ventricular ectopic beats identified on the EKG using her smart watch.  Problem List Items Addressed This Visit     Palpitations - Primary    Reviewed as above, symptoms correlating with ventricular ectopy. No other syncopal or near syncopal episodes.       Relevant Orders   EKG 12-Lead (Completed)   ECHOCARDIOGRAM COMPLETE   LONG TERM MONITOR (3-14 DAYS)   Frequent PVCs    Her symptoms appear to correlate with frequent ventricular ectopy identified. Will obtain 3-day Zio patch to assess ventricular ectopy burden. If not significant remaining below 5% her symptoms are minimal, can hold off on any medication regimen.  If she does have significant burden of ventricular ectopy or any runs of ventricular ectopy, will consider low-dose beta-blockers.  Her blood pressure would be somewhat of Ritter limiting factor given her systolic blood pressure runs in the l upper 90s to low 100s.  Will obtain Ritter transthoracic echocardiogram to rule out any structural issues with the heart.      Relevant Orders   ECHOCARDIOGRAM COMPLETE   10-year ASCVD risk is 1%.  This is resolved. She was wondering about obtaining Ritter coronary calcium score but currently unwilling to consider statins. In the setting given her overall risk for CAD being low, will hold off on obtaining Ritter calcium score study.  If she wishes to pursue this in the future she will inform us and we can arrange for this.  Return to clinic in 6 months or as  needed. History of Present Illness:    Laura Ritter is Ritter 57 y.o. female who is being seen today for the evaluation of bradycardia and irregular heart rhythm and dyspnea on exertion at the request of Kuneff, Renee A, DO. She had previously seen Dr. 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 Echocardiogram in 2017 noted  mildly dilated left ventricle with LVEF 50 to 55% without any significant abnormalities.  She has done well over the years.  More recently in August she had symptoms of palpitations which she described as skipped beat or an extra beat associated with slow heartbeat as noted on her smart watch, EKG recorded using the Apple Watch reviewed today noted sinus rhythm with frequent ventricular ectopy in Ritter bigeminy pattern.  She had noticeable symptoms on couple of the days.  She mentions sensation of skipped beat or an extra beat pretty much every day at this point.  No specific trigger identified.  No lightheadedness, dizziness or syncopal episodes.  She continues to have excellent functional capacity.  Exercises regularly at the gym including Ritter mix of  cardio, high intensity training, strength training.  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  Has no limitations with activities at work or at home.  Does not smoke.  Consumes 2 drinks of alcohol on Friday and Saturday. No recreational drug use. Occasionally drinks Coke Zero.  EKG in the clinic today shows sinus rhythm with normal PR interval normal QRS duration and axis.  No  significant change in comparison to prior EKG from February 2022.  Unremarkable CBC and CMP from September 2024 other than mild leukopenia 3.3 and hemoglobin elevated at 15.1.  TSH levels in January 2024 were normal.   The 10-year ASCVD risk score (Arnett DK, et al., 2019) is: 1%   Values used to calculate the score:     Age: 57 years     Sex: Female     Is Non-Hispanic African American: No     Diabetic: No     Tobacco smoker: No     Systolic Blood Pressure: 98 mmHg     Is BP treated: No     HDL Cholesterol: 75.9 mg/dL     Total Cholesterol: 209 mg/dL    Past Medical History:  Diagnosis Date   Chicken pox    Chronic kidney disease 4/09   Kidney stones   Leukopenia    Lymphoma (HCC)    Menopausal state 07/09/2013   GYN note 04/29/2015  Discussed risks/benefits/side effects and expectations with vaginal dryness.Discussed can be used in cream form, tablet or ring. Would recommend tablet due to low dose to start with. Questions addressed about using coconut oil also with use and discussed can be used without problems.Patient would like to use tablet form to try.  Rx Vagifem with instructions to use one tablet at   Migraine    Mucosa-associated lymphoid tissue (MALT) lymphoma 04/21/2020   Multiple pulmonary nodules 03/19/2015   Primary osteoarthritis involving multiple joints 07/24/2014   Vitamin D deficiency 03/29/2021    Past Surgical History:  Procedure Laterality Date   BRONCHIAL BIOPSY  11/28/2019   Procedure: BRONCHIAL BIOPSIES;  Surgeon: Laura Igo, DO;  Location: [facility];  Service: Pulmonary;;   BRONCHIAL BRUSHINGS  11/28/2019   Procedure: BRONCHIAL BRUSHINGS;  Surgeon: Laura Igo, DO;  Location: [facility];  Service: Pulmonary;;   BRONCHIAL NEEDLE ASPIRATION BIOPSY  11/28/2019   Procedure: BRONCHIAL NEEDLE ASPIRATION BIOPSIES;  Surgeon: Laura Igo, DO;  Location: [facility];  Service: Pulmonary;;   BRONCHIAL WASHINGS  11/28/2019   Procedure:  BRONCHIAL WASHINGS;  Surgeon: Laura Igo, DO;  Location: [facility];  Service: Pulmonary;;   CESAREAN SECTION     x3   COLONOSCOPY     EXTRACORPOREAL SHOCK WAVE LITHOTRIPSY Right 04/19/2017   Procedure: RIGHT EXTRACORPOREAL SHOCK WAVE LITHOTRIPSY (ESWL);  Surgeon: Rene Paci, MD;  Location: [facility];  Service: Urology;  Laterality: Right;   EYE SURGERY  March 2006   Lasix   FIDUCIAL MARKER PLACEMENT  11/28/2019   Procedure: FIDUCIAL MARKER PLACEMENT;  Surgeon: Laura Igo, DO;  Location: [facility];  Service: Pulmonary;;   INTERCOSTAL NERVE BLOCK Right 03/11/2020   Procedure: INTERCOSTAL NERVE BLOCK;  Surgeon: Loreli Slot, MD;  Location: [facility];  Service: Thoracic;  Laterality: Right;   LAPAROSCOPIC CHOLECYSTECTOMY  2001   LUMBAR LAMINECTOMY  01/2014   herniated disc   LYMPH NODE DISSECTION N/Ritter 03/11/2020   Procedure: LYMPH NODE DISSECTION;  Surgeon: Loreli Slot, MD;  Location: [facility];  Service: Thoracic;  Laterality: N/Ritter;  REFRACTIVE SURGERY Bilateral 2006   lasik   SPINE SURGERY     VIDEO BRONCHOSCOPY WITH ENDOBRONCHIAL NAVIGATION N/Ritter 11/28/2019   Procedure: VIDEO BRONCHOSCOPY WITH ENDOBRONCHIAL NAVIGATION;  Surgeon: Laura Igo, DO;  Location: [facility];  Service: Pulmonary;  Laterality: N/Ritter;   WISDOM TOOTH EXTRACTION     XI ROBOTIC ASSISTED THORASCOPY - RIGHT LOWER LOBE WEDGE RESECTION (Right)  03/11/2020   path pending. lung nodules    Current Medications: Current Meds  Medication Sig   Calcium Carbonate-Vitamin D 600-400 MG-UNIT tablet Take 1 tablet by mouth daily.   Estradiol 10 MCG TABS vaginal tablet Place 1 tablet (10 mcg total) vaginally 2 (two) times Ritter week.   influenza vac split trivalent PF (FLULAVAL) 0.5 ML injection Inject into the muscle.   loratadine (CLARITIN) 5 MG chewable tablet Chew 5 mg by mouth daily.   melatonin 5 MG TABS Take 5 mg by mouth as needed (sleep).   Multiple Vitamins-Minerals (MULTIVITAMIN  PO) Take 1 tablet by mouth daily.    Omega-3 Fatty Acids (FISH OIL PO) Take 1 tablet by mouth daily.      Allergies:   Patient has no known allergies.   Social History   Socioeconomic History   Marital status: Married    Spouse name: Not on file   Number of children: 3   Years of education: Masters De   Highest education level: Not on file  Occupational History   Occupation: Physical Therapist    Employer: [employer]  Tobacco Use   Smoking status: Never    Passive exposure: Never   Smokeless tobacco: Never  Vaping Use   Vaping status: Never Used  Substance and Sexual Activity   Alcohol use: Yes    Alcohol/week: 6.0 standard drinks of alcohol    Types: 6 Standard drinks or equivalent per week    Comment: social   Drug use: No   Sexual activity: Yes    Partners: Male    Birth control/protection: Post-menopausal    Comment: husband vasectomy  Other Topics Concern   Not on file  Social History Narrative   Marital status/children/pets: Married G3P3   Education/employment: Masters degree.  Works as Ritter Wellsite geologist:      -smoke alarm in the home:Yes     - wears seatbelt: Yes     - Feels safe in their relationships: Yes      Social Determinants of Corporate investment banker Strain: Not on file  Food Insecurity: Not on file  Transportation Needs: Not on file  Physical Activity: Not on file  Stress: Not on file  Social Connections: Not on file     Family History: The patient's family history includes Alcohol abuse in her brother, father, and mother; Alzheimer's disease in her mother; Arthritis in her mother; Dementia in her maternal grandmother; Heart disease in her paternal grandfather; Hyperlipidemia in her mother; Hypertension in her father; Kidney Stones in her brother; Parkinson's disease in her father and paternal grandfather. ROS:   Please see the history of present illness.    All 14 point review of systems negative except as described per  history of present illness.  EKGs/Labs/Other Studies Reviewed:    The following studies were reviewed today:   EKG:  EKG Interpretation Date/Time:  Thursday November 02 2022 15:37:50 EDT Ventricular Rate:  77 PR Interval:  152 QRS Duration:  74 QT Interval:  388 QTC Calculation: 439 R Axis:   81  Text Interpretation: Normal  sinus rhythm Normal ECG When compared with ECG of 09-Mar-2020 14:07, No significant change was found Confirmed by Huntley Dec reddy [phone]) on 11/02/2022 4:06:08 PM    Recent Labs: 02/22/2022: TSH 1.13 10/18/2022: ALT 22; BUN 18; Creatinine 0.90; Hemoglobin 15.1; Platelet Count 162; Potassium 4.1; Sodium 140  Recent Lipid Panel    Component Value Date/Time   CHOL 209 (H) 02/22/2022 0805   CHOL 186 04/18/2019 1357   TRIG 58.0 02/22/2022 0805   HDL 75.90 02/22/2022 0805   HDL 89 04/18/2019 1357   CHOLHDL 3 02/22/2022 0805   VLDL 11.6 02/22/2022 0805   LDLCALC 122 (H) 02/22/2022 0805   LDLCALC 82 02/21/2021 1355    Physical Exam:    VS:  BP 98/60   Pulse 77   Ht 5\' 7"  (1.702 m)   Wt 139 lb (63 kg)   LMP 01/12/2014 (LMP Unknown)   SpO2 97%   BMI 21.77 kg/m     Wt Readings from Last 3 Encounters:  11/02/22 139 lb (63 kg)  10/25/22 140 lb 8 oz (63.7 kg)  09/18/22 137 lb 9.6 oz (62.4 kg)     GENERAL:  Well nourished, well developed in no acute distress NECK: No JVD; No carotid bruits CARDIAC: RRR, S1 and S2 present, no murmurs, no rubs, no gallops CHEST:  Clear to auscultation without rales, wheezing or rhonchi  Extremities: Pulses bilaterally symmetric with radial 2+  NEUROLOGIC:  Alert and oriented x 3  Medication Adjustments/Labs and Tests Ordered: Current medicines are reviewed at length with the patient today.  Concerns regarding medicines are outlined above.  Orders Placed This Encounter  Procedures   LONG TERM MONITOR (3-14 DAYS)   EKG 12-Lead   ECHOCARDIOGRAM COMPLETE   No orders of the defined types were placed in this  encounter.   Signed, Sahmya Arai reddy Paolina Karwowski, MD, MPH, [facility]. 11/02/2022 4:44 PM    [facility]

## 2022-11-02 NOTE — Assessment & Plan Note (Signed)
Reviewed as above, symptoms correlating with ventricular ectopy. No other syncopal or near syncopal episodes.

## 2022-11-14 ENCOUNTER — Other Ambulatory Visit: Payer: Self-pay

## 2022-11-14 DIAGNOSIS — I493 Ventricular premature depolarization: Secondary | ICD-10-CM

## 2022-11-14 DIAGNOSIS — R002 Palpitations: Secondary | ICD-10-CM

## 2022-11-28 ENCOUNTER — Ambulatory Visit (HOSPITAL_BASED_OUTPATIENT_CLINIC_OR_DEPARTMENT_OTHER): Payer: 59

## 2023-02-09 ENCOUNTER — Ambulatory Visit: Payer: 59 | Admitting: Urgent Care

## 2023-02-09 ENCOUNTER — Encounter: Payer: Self-pay | Admitting: Urgent Care

## 2023-02-09 VITALS — BP 122/77 | HR 53 | Wt 140.0 lb

## 2023-02-09 DIAGNOSIS — M7712 Lateral epicondylitis, left elbow: Secondary | ICD-10-CM

## 2023-02-09 MED ORDER — DICLOFENAC SODIUM 75 MG PO TBEC
75.0000 mg | DELAYED_RELEASE_TABLET | Freq: Two times a day (BID) | ORAL | 0 refills | Status: DC
Start: 2023-02-09 — End: 2023-05-17

## 2023-02-09 NOTE — Progress Notes (Signed)
 Established Patient Office Visit  Subjective:  Patient ID: Laura Ritter, female    DOB: July 05, 1965  Age: 58 y.o. MRN: [id number]  Chief Complaint  Patient presents with   Elbow Injury    Left elbow and arm pain that has been going on since christmas.   Discussed the use of AI software for clinical note transcription with the patient who gave verbal consent to proceed.   History of Present Illness The patient, with a history of regular exercise, presents with a complaint of L elbow pain that began in the first week of January. The pain was not associated with any trauma or bruising and did not initially interfere with her workout routine. However, the discomfort has progressively worsened to the point where the patient can no longer lift weights or perform certain tasks at work, where she frequently handles weights. The pain is described as an ache, localized to a specific point on the lateral elbow, and is exacerbated by lifting objects. The patient attempted self-treatment with dry needling, which provided temporary relief, but the pain and muscle tightness returned. The patient denies any symptoms in the back of the elbow, but notes some soreness when rubbing the area at night. The patient has not taken any anti-inflammatories for the condition.    Patient Active Problem List   Diagnosis Date Noted   Chicken pox    Chronic kidney disease    Leukopenia    Lymphoma (HCC)    Migraine    Vitamin D  deficiency 03/29/2021   Mucosa-associated lymphoid tissue (MALT) lymphoma 04/21/2020   Frequent PVCs 04/01/2015   Multiple pulmonary nodules 03/19/2015   Palpitations 03/01/2015   Primary osteoarthritis involving multiple joints 07/24/2014   Menopausal state 07/09/2013   Past Medical History:  Diagnosis Date   Chicken pox    Chronic kidney disease 4/09   Kidney stones   Leukopenia    Lymphoma (HCC)    Menopausal state 07/09/2013   GYN note 04/29/2015  Discussed risks/benefits/side  effects and expectations with vaginal dryness.Discussed can be used in cream form, tablet or ring. Would recommend tablet due to low dose to start with. Questions addressed about using coconut oil also with use and discussed can be used without problems.Patient would like to use tablet form to try.  Rx Vagifem  with instructions to use one tablet at   Migraine    Mucosa-associated lymphoid tissue (MALT) lymphoma 04/21/2020   Multiple pulmonary nodules 03/19/2015   Primary osteoarthritis involving multiple joints 07/24/2014   Vitamin D  deficiency 03/29/2021   Social History   Tobacco Use   Smoking status: Never    Passive exposure: Never   Smokeless tobacco: Never  Vaping Use   Vaping status: Never Used  Substance Use Topics   Alcohol use: Yes    Alcohol/week: 6.0 standard drinks of alcohol    Types: 6 Standard drinks or equivalent per week    Comment: social   Drug use: No      ROS: as noted in HPI  Objective:     BP 122/77   Pulse (!) 53   Wt 140 lb (63.5 kg)   LMP 01/12/2014 (LMP Unknown)   SpO2 99%   BMI 21.93 kg/m  BP Readings from Last 3 Encounters:  02/09/23 122/77  11/02/22 98/60  10/25/22 109/67   Wt Readings from Last 3 Encounters:  02/09/23 140 lb (63.5 kg)  11/02/22 139 lb (63 kg)  10/25/22 140 lb 8 oz (63.7 kg)  Physical Exam Vitals and nursing note reviewed.  Constitutional:      General: She is not in acute distress.    Appearance: Normal appearance. She is normal weight. She is not ill-appearing, toxic-appearing or diaphoretic.  HENT:     Head: Normocephalic.  Eyes:     General: No scleral icterus.    Extraocular Movements: Extraocular movements intact.     Pupils: Pupils are equal, round, and reactive to light.  Cardiovascular:     Rate and Rhythm: Normal rate.  Pulmonary:     Effort: Pulmonary effort is normal. No respiratory distress.  Musculoskeletal:        General: No swelling, deformity or signs of injury. Normal range of  motion.     Left upper arm: Normal. No swelling, edema or bony tenderness.     Left elbow: No swelling or deformity. Normal range of motion. Tenderness (to extensor muscles of L forearm) present in lateral epicondyle.     Left forearm: Tenderness (extensor muscles of forearm) present.     Right wrist: Normal.     Left wrist: Normal. No swelling, tenderness or bony tenderness. Normal range of motion. Normal pulse.     Left hand: Normal. No swelling, tenderness or bony tenderness. Normal range of motion. Normal strength. Normal sensation. Normal pulse.       Arms:     Right lower leg: No edema.     Left lower leg: No edema.  Skin:    General: Skin is warm and dry.     Coloration: Skin is not jaundiced.     Findings: No bruising, erythema or rash.  Neurological:     General: No focal deficit present.     Mental Status: She is alert and oriented to person, place, and time.     Sensory: No sensory deficit.     Motor: No weakness.     Gait: Gait normal.  Psychiatric:        Mood and Affect: Mood normal.        Behavior: Behavior normal.      No results found for any visits on 02/09/23.  Last CBC Lab Results  Component Value Date   WBC 3.3 (L) 10/18/2022   HGB 15.1 (H) 10/18/2022   HCT 45.0 10/18/2022   MCV 88.1 10/18/2022   MCH 29.5 10/18/2022   RDW 12.7 10/18/2022   PLT 162 10/18/2022   Last metabolic panel Lab Results  Component Value Date   GLUCOSE 97 10/18/2022   NA 140 10/18/2022   K 4.1 10/18/2022   CL 103 10/18/2022   CO2 31 10/18/2022   BUN 18 10/18/2022   CREATININE 0.90 10/18/2022   GFRNONAA >60 10/18/2022   CALCIUM 9.7 10/18/2022   PROT 7.8 10/18/2022   ALBUMIN 5.0 10/18/2022   LABGLOB 1.9 04/18/2019   AGRATIO 2.5 (H) 04/18/2019   BILITOT 0.7 10/18/2022   ALKPHOS 51 10/18/2022   AST 23 10/18/2022   ALT 22 10/18/2022   ANIONGAP 6 10/18/2022      The 10-year ASCVD risk score (Arnett DK, et al., 2019) is: 1.8%  Assessment & Plan:  Lateral  epicondylitis of left elbow -     Diclofenac  Sodium; Take 1 tablet (75 mg total) by mouth 2 (two) times daily with a meal.  Dispense: 28 tablet; Refill: 0   Assessment and Plan Lateral Epicondylitis Pain and tightness in the elbow for approximately 1.5 weeks, affecting daily activities and work. No trauma or bruising. Normal range of motion. Pain  exacerbated by resistance. Attempted self-treatment with dry needling with temporary relief. -Start Diclofenac  to be taken consistently with food for 7-14 days to reduce inflammation. -Use compression device (elbow band) on the area of maximal intensity. -Avoid lifting weights or repetitive motion until pain has long subsided. -Consider steroid injections or further dry needling if no improvement with current plan. -Check back after completion of Diclofenac  course.  No follow-ups on file.   Benton LITTIE Gave, PA

## 2023-02-09 NOTE — Patient Instructions (Signed)
 Please take the diclofenac  twice daily with food. Take for a minimum of 7 days, up to a maximum of 14 days. Do not take any additional OTC NSAIDS (advil, aleve , naproxen , ibuprofen, etc)  Please use the epicondylitis brace that we discussed. Wear at all times (other than sleep or in the shower) for the next 7-14 days.  Please read the attached handout and perform the rehab activities at home.  If you develop any stomach irritation on the NSAID, please notify our office.

## 2023-02-28 ENCOUNTER — Encounter: Payer: Self-pay | Admitting: Family Medicine

## 2023-02-28 ENCOUNTER — Ambulatory Visit (INDEPENDENT_AMBULATORY_CARE_PROVIDER_SITE_OTHER): Payer: 59 | Admitting: Family Medicine

## 2023-02-28 VITALS — BP 92/68 | HR 64 | Temp 98.0°F | Ht 67.0 in | Wt 136.0 lb

## 2023-02-28 DIAGNOSIS — Z79899 Other long term (current) drug therapy: Secondary | ICD-10-CM

## 2023-02-28 DIAGNOSIS — E559 Vitamin D deficiency, unspecified: Secondary | ICD-10-CM | POA: Diagnosis not present

## 2023-02-28 DIAGNOSIS — C8590 Non-Hodgkin lymphoma, unspecified, unspecified site: Secondary | ICD-10-CM | POA: Diagnosis not present

## 2023-02-28 DIAGNOSIS — Z23 Encounter for immunization: Secondary | ICD-10-CM

## 2023-02-28 DIAGNOSIS — Z0000 Encounter for general adult medical examination without abnormal findings: Secondary | ICD-10-CM | POA: Diagnosis not present

## 2023-02-28 DIAGNOSIS — D72819 Decreased white blood cell count, unspecified: Secondary | ICD-10-CM

## 2023-02-28 DIAGNOSIS — Z13220 Encounter for screening for lipoid disorders: Secondary | ICD-10-CM

## 2023-02-28 DIAGNOSIS — Z131 Encounter for screening for diabetes mellitus: Secondary | ICD-10-CM

## 2023-02-28 LAB — COMPREHENSIVE METABOLIC PANEL
ALT: 43 U/L — ABNORMAL HIGH (ref 0–35)
AST: 30 U/L (ref 0–37)
Albumin: 4.8 g/dL (ref 3.5–5.2)
Alkaline Phosphatase: 48 U/L (ref 39–117)
BUN: 17 mg/dL (ref 6–23)
CO2: 33 meq/L — ABNORMAL HIGH (ref 19–32)
Calcium: 9.4 mg/dL (ref 8.4–10.5)
Chloride: 101 meq/L (ref 96–112)
Creatinine, Ser: 0.83 mg/dL (ref 0.40–1.20)
GFR: 78.4 mL/min (ref >60.00)
Glucose, Bld: 77 mg/dL (ref 70–99)
Potassium: 4 meq/L (ref 3.5–5.1)
Sodium: 139 meq/L (ref 135–145)
Total Bilirubin: 0.7 mg/dL (ref 0.2–1.2)
Total Protein: 6.9 g/dL (ref 6.0–8.3)

## 2023-02-28 LAB — CBC WITH DIFFERENTIAL/PLATELET
Basophils Absolute: 0 10*3/uL (ref 0.0–0.1)
Basophils Relative: 1.3 % (ref 0.0–3.0)
Eosinophils Absolute: 0.1 10*3/uL (ref 0.0–0.7)
Eosinophils Relative: 3 % (ref 0.0–5.0)
HCT: 43.6 % (ref 36.0–46.0)
Hemoglobin: 14.6 g/dL (ref 12.0–15.0)
Lymphocytes Relative: 33.2 % (ref 12.0–46.0)
Lymphs Abs: 0.8 10*3/uL (ref 0.7–4.0)
MCHC: 33.4 g/dL (ref 30.0–36.0)
MCV: 88.1 fL (ref 78.0–100.0)
Monocytes Absolute: 0.2 10*3/uL (ref 0.1–1.0)
Monocytes Relative: 8.4 % (ref 3.0–12.0)
Neutro Abs: 1.4 10*3/uL (ref 1.4–7.7)
Neutrophils Relative %: 54.1 % (ref 43.0–77.0)
Platelets: 194 10*3/uL (ref 150.0–400.0)
RBC: 4.95 Mil/uL (ref 3.87–5.11)
RDW: 13.4 % (ref 11.5–15.5)
WBC: 2.5 10*3/uL — ABNORMAL LOW (ref 4.0–10.5)

## 2023-02-28 LAB — LIPID PANEL
Cholesterol: 202 mg/dL — ABNORMAL HIGH (ref 0–200)
HDL: 83.4 mg/dL (ref >39.00)
LDL Cholesterol: 108 mg/dL — ABNORMAL HIGH (ref 0–99)
NonHDL: 118.97
Total CHOL/HDL Ratio: 2
Triglycerides: 55 mg/dL (ref 0.0–149.0)
VLDL: 11 mg/dL (ref 0.0–40.0)

## 2023-02-28 LAB — HEMOGLOBIN A1C: Hgb A1c MFr Bld: 5.5 % (ref 4.6–6.5)

## 2023-02-28 LAB — TSH: TSH: 1.36 u[IU]/mL (ref 0.35–5.50)

## 2023-02-28 NOTE — Patient Instructions (Addendum)
Return in about 1 year (around 02/29/2024) for cpe (20 min).        Great to see you today.  I have refilled the medication(s) we provide.   If labs were collected or images ordered, we will inform you of  results once we have received them and reviewed. We will contact you either by echart message, or telephone call.  Please give ample time to the testing facility, and our office to run,  receive and review results. Please do not call inquiring of results, even if you can see them in your chart. We will contact you as soon as we are able. If it has been over 1 week since the test was completed, and you have not yet heard from [place], then please call us.    - echart message- for normal results that have been seen by the patient already.   - telephone call: abnormal results or if patient has not viewed results in their echart.  If a referral to a specialist was entered for you, please call us in 2 weeks if you have not heard from the specialist office to schedule.

## 2023-02-28 NOTE — Progress Notes (Signed)
Patient ID: Laura Ritter, female  DOB: [birth year], 58 y.o.   MRN: [id number] Patient Care Team    Relationship Specialty Notifications Start End  Natalia Leatherwood, DO PCP - General Family Medicine  02/21/21   Bjorn Pippin, MD Attending Physician Urology  01/14/16   Chilton Si, MD Attending Physician Cardiology  01/14/16    Comment: palpitations  Chilton Greathouse, MD Consulting Physician Pulmonary Disease  01/14/16    Comment: nodules  Charna Elizabeth, MD Consulting Physician Gastroenterology  12/12/16   Andrena Mews, DO Consulting Physician Sports Medicine  02/01/18   Patton Salles, MD Consulting Physician Obstetrics and Gynecology  07/03/19   Josephine Igo, DO Consulting Physician Pulmonary Disease  03/24/20   Loreli Slot, MD Consulting Physician Cardiothoracic Surgery  03/24/20   Si Gaul, MD Consulting Physician Oncology  02/21/21   Tanya Nones, OD  Optometry  02/21/21     Chief Complaint  Patient presents with   Annual Exam    Pt is fasting    Subjective:  Laura Ritter is a 58 y.o.  female present for  CPE All past medical history, surgical history, allergies, family history, immunizations, medications and social history were updated in the electronic medical record today. All recent labs, ED visits and hospitalizations within the last year were reviewed.  Health maintenance:  Colonoscopy: No fhx. completed 2018, by Dr. Loreta Ave- 10 yr follow up Mammogram: No fhx.  05/03/2022. completed:gyn Cervical cancer screening: last pap: , results: 04/12/2018, completed by: gyn, 5-year follow-up-records requested for any recent Paps completed since 2020 Immunizations: tdap UTD 2018, Influenza UTD 2024 (encouraged yearly),  shingrix completed, PNA20 today- Infectious disease screening: HIV and Hep C completed DEXA: routine screen at 60 Assistive device: none Oxygen NWG:[id number] Patient has a Dental home. Hospitalizations/ED visits: Reviewed      09/18/2022   10:42 AM 02/22/2022    7:59 AM 02/21/2021    1:36 PM 09/04/2016    2:35 PM  Depression screen PHQ 2/9  Decreased Interest 0 0 0 0  Down, Depressed, Hopeless 0 0 0 0  PHQ - 2 Score 0 0 0 0       No data to display                  No data to display          Immunization History  Administered Date(s) Administered   Influenza, Seasonal, Injecte, Preservative Fre 10/25/2022   Influenza,inj,Quad PF,6+ Mos 10/31/2014, 11/01/2015, 10/27/2016   Influenza-Unspecified 11/19/2017, 11/13/2018, 11/13/2019, 10/25/2020, 10/30/2021   PFIZER Comirnaty(Gray Top)Covid-19 Tri-Sucrose Vaccine 05/18/2020   PFIZER(Purple Top)SARS-COV-2 Vaccination 11/17/2019   PNEUMOCOCCAL CONJUGATE-20 02/28/2023   Tdap 01/30/2005, 04/05/2016   Unspecified SARS-COV-2 Vaccination 11/03/2021   Zoster Recombinant(Shingrix) 01/06/2021, 03/25/2021    No results found.  Past Medical History:  Diagnosis Date   Chicken pox    Chronic kidney disease 4/09   Kidney stones   Leukopenia    Lymphoma (HCC)    Menopausal state 07/09/2013   GYN note 04/29/2015  Discussed risks/benefits/side effects and expectations with vaginal dryness.Discussed can be used in cream form, tablet or ring. Would recommend tablet due to low dose to start with. Questions addressed about using coconut oil also with use and discussed can be used without problems.Patient would like to use tablet form to try.  Rx Vagifem with instructions to use one tablet at   Migraine    Mucosa-associated lymphoid tissue (MALT) lymphoma  04/21/2020   Multiple pulmonary nodules 03/19/2015   Primary osteoarthritis involving multiple joints 07/24/2014   Vitamin D deficiency 03/29/2021   No Known Allergies Past Surgical History:  Procedure Laterality Date   BRONCHIAL BIOPSY  11/28/2019   Procedure: BRONCHIAL BIOPSIES;  Surgeon: Josephine Igo, DO;  Location: [facility];  Service: Pulmonary;;   BRONCHIAL BRUSHINGS  11/28/2019   Procedure:  BRONCHIAL BRUSHINGS;  Surgeon: Josephine Igo, DO;  Location: [facility];  Service: Pulmonary;;   BRONCHIAL NEEDLE ASPIRATION BIOPSY  11/28/2019   Procedure: BRONCHIAL NEEDLE ASPIRATION BIOPSIES;  Surgeon: Josephine Igo, DO;  Location: [facility];  Service: Pulmonary;;   BRONCHIAL WASHINGS  11/28/2019   Procedure: BRONCHIAL WASHINGS;  Surgeon: Josephine Igo, DO;  Location: [facility];  Service: Pulmonary;;   CESAREAN SECTION     x3   COLONOSCOPY     EXTRACORPOREAL SHOCK WAVE LITHOTRIPSY Right 04/19/2017   Procedure: RIGHT EXTRACORPOREAL SHOCK WAVE LITHOTRIPSY (ESWL);  Surgeon: Rene Paci, MD;  Location: [facility];  Service: Urology;  Laterality: Right;   EYE SURGERY  March 2006   Lasix   FIDUCIAL MARKER PLACEMENT  11/28/2019   Procedure: FIDUCIAL MARKER PLACEMENT;  Surgeon: Josephine Igo, DO;  Location: [facility];  Service: Pulmonary;;   INTERCOSTAL NERVE BLOCK Right 03/11/2020   Procedure: INTERCOSTAL NERVE BLOCK;  Surgeon: Loreli Slot, MD;  Location: [facility];  Service: Thoracic;  Laterality: Right;   LAPAROSCOPIC CHOLECYSTECTOMY  2001   LUMBAR LAMINECTOMY  01/2014   herniated disc   LYMPH NODE DISSECTION N/A 03/11/2020   Procedure: LYMPH NODE DISSECTION;  Surgeon: Loreli Slot, MD;  Location: [facility] OR;  Service: Thoracic;  Laterality: N/A;   REFRACTIVE SURGERY Bilateral 2006   lasik   SPINE SURGERY     VIDEO BRONCHOSCOPY WITH ENDOBRONCHIAL NAVIGATION N/A 11/28/2019   Procedure: VIDEO BRONCHOSCOPY WITH ENDOBRONCHIAL NAVIGATION;  Surgeon: Josephine Igo, DO;  Location: [facility];  Service: Pulmonary;  Laterality: N/A;   WISDOM TOOTH EXTRACTION     XI ROBOTIC ASSISTED THORASCOPY - RIGHT LOWER LOBE WEDGE RESECTION (Right)  03/11/2020   path pending. lung nodules   Family History  Problem Relation Age of Onset   Hyperlipidemia Mother    Alcohol abuse Mother    Arthritis Mother    Alzheimer's disease Mother    Hypertension Father     Parkinson's disease Father    Alcohol abuse Father    Kidney Stones Brother    Alcohol abuse Brother    Dementia Maternal Grandmother    Heart disease Paternal Grandfather    Parkinson's disease Paternal Grandfather    Social History   Social History Narrative   Marital status/children/pets: Married G3P3   Education/employment: Masters degree.  Works as a [employer]:      -smoke alarm in the home:Yes     - wears seatbelt: Yes     - Feels safe in their relationships: Yes       Allergies as of 02/28/2023   No Known Allergies      Medication List        Accurate as of February 28, 2023  8:38 AM. If you have any questions, ask your nurse or doctor.          STOP taking these medications    Flulaval 0.5 ML injection Generic drug: influenza vac split trivalent PF Stopped by: Felix Pacini       TAKE these medications    Calcium Carbonate-Vitamin  D 600-400 MG-UNIT tablet Take 1 tablet by mouth daily.   diclofenac 75 MG EC tablet Commonly known as: VOLTAREN Take 1 tablet (75 mg total) by mouth 2 (two) times daily with a meal.   Estradiol 10 MCG Tabs vaginal tablet Place 1 tablet (10 mcg total) vaginally 2 (two) times a week.   FISH OIL PO Take 1 tablet by mouth daily.   loratadine 5 MG chewable tablet Commonly known as: CLARITIN Chew 5 mg by mouth daily.   melatonin 5 MG Tabs Take 5 mg by mouth as needed (sleep).   MULTIVITAMIN PO Take 1 tablet by mouth daily.        All past medical history, surgical history, allergies, family history, immunizations andmedications were updated in the EMR today and reviewed under the history and medication portions of their EMR.    No results found for this or any previous visit (from the past 2160 hours).   ROS 14 pt review of systems performed and negative (unless mentioned in an HPI)  Objective: BP 92/68   Pulse 64   Temp 98 F (36.7 C)   Ht 5\' 7"  (1.702 m)   Wt 136 lb (61.7 kg)   LMP  01/12/2014 (LMP Unknown)   SpO2 99%   BMI 21.30 kg/m  Physical Exam Vitals and nursing note reviewed.  Constitutional:      General: She is not in acute distress.    Appearance: Normal appearance. She is not ill-appearing or toxic-appearing.  HENT:     Head: Normocephalic and atraumatic.     Right Ear: Tympanic membrane, ear canal and external ear normal. There is no impacted cerumen.     Left Ear: Tympanic membrane, ear canal and external ear normal. There is no impacted cerumen.     Nose: No congestion or rhinorrhea.     Mouth/Throat:     Mouth: Mucous membranes are moist.     Pharynx: Oropharynx is clear. No oropharyngeal exudate or posterior oropharyngeal erythema.  Eyes:     General: No scleral icterus.       Right eye: No discharge.        Left eye: No discharge.     Extraocular Movements: Extraocular movements intact.     Conjunctiva/sclera: Conjunctivae normal.     Pupils: Pupils are equal, round, and reactive to light.  Cardiovascular:     Rate and Rhythm: Normal rate and regular rhythm.     Pulses: Normal pulses.     Heart sounds: Normal heart sounds. No murmur heard.    No friction rub. No gallop.  Pulmonary:     Effort: Pulmonary effort is normal. No respiratory distress.     Breath sounds: Normal breath sounds. No stridor. No wheezing, rhonchi or rales.  Chest:     Chest wall: No tenderness.  Abdominal:     General: Abdomen is flat. Bowel sounds are normal. There is no distension.     Palpations: Abdomen is soft. There is no mass.     Tenderness: There is no abdominal tenderness. There is no right CVA tenderness, left CVA tenderness, guarding or rebound.     Hernia: No hernia is present.  Musculoskeletal:        General: No swelling, tenderness or deformity. Normal range of motion.     Cervical back: Normal range of motion and neck supple. No rigidity or tenderness.     Right lower leg: No edema.     Left lower leg: No edema.  Lymphadenopathy:  Cervical:  No cervical adenopathy.  Skin:    General: Skin is warm and dry.     Coloration: Skin is not jaundiced or pale.     Findings: No bruising, erythema, lesion or rash.  Neurological:     General: No focal deficit present.     Mental Status: She is alert and oriented to person, place, and time. Mental status is at baseline.     Cranial Nerves: No cranial nerve deficit.     Sensory: No sensory deficit.     Motor: No weakness.     Coordination: Coordination normal.     Gait: Gait normal.     Deep Tendon Reflexes: Reflexes normal.  Psychiatric:        Mood and Affect: Mood normal.        Behavior: Behavior normal.        Thought Content: Thought content normal.        Judgment: Judgment normal.      Assessment/plan: WYATT GALVAN is a 58 y.o. female present for cpe Routine general medical examination at a health care facility Patient was encouraged to exercise greater than 150 minutes a week. Patient was encouraged to choose a diet filled with fresh fruits and vegetables, and lean meats. AVS provided to patient today for education/recommendation on gender specific health and safety maintenance. Colonoscopy: No fhx. completed 2018, by Dr. Loreta Ave- 10 yr follow up Mammogram: No fhx.  05/03/2022. completed:gyn Cervical cancer screening: last pap: , results: 04/12/2018, completed by: gyn, 5-year follow-up-records requested for any recent Paps completed since 2020 Immunizations: tdap UTD 2018, Influenza UTD 2024 (encouraged yearly),  shingrix completed, PNA20 today- Infectious disease screening: HIV and Hep C completed DEXA: routine screen at 60 - Comp Met (CMET) - TSH  Lymphoma, unspecified body region, unspecified lymphoma type (HCC)/Leukopenia, unspecified type - CBC with Differential/Platelet Encounter for long-term current use of medication - Hemoglobin A1c - Lipid panel   Return in about 1 year (around 02/29/2024) for cpe (20 min).  Orders Placed This Encounter  Procedures    Pneumococcal conjugate vaccine 20-valent   CBC with Differential/Platelet   Comp Met (CMET)   Hemoglobin A1c   Lipid panel   TSH   No orders of the defined types were placed in this encounter.  Referral Orders  No referral(s) requested today     Note is dictated utilizing voice recognition software. Although note has been proof read prior to signing, occasional typographical errors still can be missed. If any questions arise, please do not hesitate to call for verification.  Electronically signed by: Felix Pacini, DO [facility] [place]

## 2023-03-01 ENCOUNTER — Telehealth: Payer: Self-pay | Admitting: Family Medicine

## 2023-03-01 DIAGNOSIS — R7401 Elevation of levels of liver transaminase levels: Secondary | ICD-10-CM

## 2023-03-01 NOTE — Telephone Encounter (Signed)
Your test results have returned. Thyroid and kidney function is normal. electrolytes are normal Total white blood cell count is stable, but low.  The mL of each particular white blood cell count are all in normal range, which is reassuring. Diabetes screening/A1c is normal  Cholesterol panel is at goal  There is a mild elevation in one of the liver enzymes called ALT.  Typically this is not concerning and could have been secondary to the recent use of diclofenac, with your elbow injury.  However with history of lymphoma, would want to make sure that you retest this in 2 weeks by lab appointment only to ensure it returns to normal

## 2023-03-15 ENCOUNTER — Other Ambulatory Visit (INDEPENDENT_AMBULATORY_CARE_PROVIDER_SITE_OTHER): Payer: 59

## 2023-03-15 DIAGNOSIS — R7401 Elevation of levels of liver transaminase levels: Secondary | ICD-10-CM | POA: Diagnosis not present

## 2023-03-15 LAB — HEPATIC FUNCTION PANEL
ALT: 27 U/L (ref 0–35)
AST: 22 U/L (ref 0–37)
Albumin: 4.6 g/dL (ref 3.5–5.2)
Alkaline Phosphatase: 46 U/L (ref 39–117)
Bilirubin, Direct: 0.1 mg/dL (ref 0.0–0.3)
Total Bilirubin: 0.6 mg/dL (ref 0.2–1.2)
Total Protein: 6.8 g/dL (ref 6.0–8.3)

## 2023-03-16 ENCOUNTER — Encounter: Payer: Self-pay | Admitting: Family Medicine

## 2023-04-19 ENCOUNTER — Encounter

## 2023-05-17 ENCOUNTER — Encounter: Payer: Self-pay | Admitting: Obstetrics and Gynecology

## 2023-05-17 ENCOUNTER — Encounter

## 2023-05-17 ENCOUNTER — Ambulatory Visit (INDEPENDENT_AMBULATORY_CARE_PROVIDER_SITE_OTHER): Payer: Self-pay | Admitting: Obstetrics and Gynecology

## 2023-05-17 ENCOUNTER — Other Ambulatory Visit (HOSPITAL_COMMUNITY)
Admission: RE | Admit: 2023-05-17 | Discharge: 2023-05-17 | Disposition: A | Discharge status: Home / Self Care | Source: Ambulatory Visit | Attending: Obstetrics and Gynecology | Admitting: Obstetrics and Gynecology

## 2023-05-17 VITALS — BP 118/64 | HR 67 | Ht 67.75 in | Wt 136.0 lb

## 2023-05-17 DIAGNOSIS — Z01419 Encounter for gynecological examination (general) (routine) without abnormal findings: Secondary | ICD-10-CM | POA: Diagnosis not present

## 2023-05-17 DIAGNOSIS — Z124 Encounter for screening for malignant neoplasm of cervix: Secondary | ICD-10-CM | POA: Insufficient documentation

## 2023-05-17 DIAGNOSIS — Z1331 Encounter for screening for depression: Secondary | ICD-10-CM | POA: Diagnosis not present

## 2023-05-17 DIAGNOSIS — N952 Postmenopausal atrophic vaginitis: Secondary | ICD-10-CM | POA: Diagnosis not present

## 2023-05-17 HISTORY — DX: Encounter for gynecological examination (general) (routine) without abnormal findings: Z01.419

## 2023-05-17 MED ORDER — ESTRADIOL 10 MCG VA TABS: 1.0000 | ORAL_TABLET | VAGINAL | 4 refills | Status: AC

## 2023-05-17 NOTE — Patient Instructions (Signed)
 For patients under 50-58yo, I recommend 1200mg  calcium daily and 600IU of vitamin D daily. For patients over 58yo, I recommend 1200mg  calcium daily and 800IU of vitamin D daily.  Health Maintenance, Female Adopting a healthy lifestyle and getting preventive care are important in promoting health and wellness. Ask your health care provider about: The right schedule for you to have regular tests and exams. Things you can do on your own to prevent diseases and keep yourself healthy. What should I know about diet, weight, and exercise? Eat a healthy diet  Eat a diet that includes plenty of vegetables, fruits, low-fat dairy products, and lean protein. Do not eat a lot of foods that are high in solid fats, added sugars, or sodium. Maintain a healthy weight Body mass index (BMI) is used to identify weight problems. It estimates body fat based on height and weight. Your health care provider can help determine your BMI and help you achieve or maintain a healthy weight. Get regular exercise Get regular exercise. This is one of the most important things you can do for your health. Most adults should: Exercise for at least 150 minutes each week. The exercise should increase your heart rate and make you sweat (moderate-intensity exercise). Do strengthening exercises at least twice a week. This is in addition to the moderate-intensity exercise. Spend less time sitting. Even light physical activity can be beneficial. Watch cholesterol and blood lipids Have your blood tested for lipids and cholesterol at 58 years of age, then have this test every 5 years. Have your cholesterol levels checked more often if: Your lipid or cholesterol levels are high. You are older than 58 years of age. You are at high risk for heart disease. What should I know about cancer screening? Depending on your health history and family history, you may need to have cancer screening at various ages. This may include screening  for: Breast cancer. Cervical cancer. Colorectal cancer. Skin cancer. Lung cancer. What should I know about heart disease, diabetes, and high blood pressure? Blood pressure and heart disease High blood pressure causes heart disease and increases the risk of stroke. This is more likely to develop in people who have high blood pressure readings or are overweight. Have your blood pressure checked: Every 3-5 years if you are 58-58 years of age. Every year if you are 58 years old or older. Diabetes Have regular diabetes screenings. This checks your fasting blood sugar level. Have the screening done: Once every three years after age 58 if you are at a normal weight and have a low risk for diabetes. More often and at a younger age if you are overweight or have a high risk for diabetes. What should I know about preventing infection? Hepatitis B If you have a higher risk for hepatitis B, you should be screened for this virus. Talk with your health care provider to find out if you are at risk for hepatitis B infection. Hepatitis C Testing is recommended for: Everyone born from [birth year] through 1965. Anyone with known risk factors for hepatitis C. Sexually transmitted infections (STIs) Get screened for STIs, including gonorrhea and chlamydia, if: You are sexually active and are younger than 58 years of age. You are older than 58 years of age and your health care provider tells you that you are at risk for this type of infection. Your sexual activity has changed since you were last screened, and you are at increased risk for chlamydia or gonorrhea. Ask your health care provider if  you are at risk. Ask your health care provider about whether you are at high risk for HIV. Your health care provider may recommend a prescription medicine to help prevent HIV infection. If you choose to take medicine to prevent HIV, you should first get tested for HIV. You should then be tested every 3 months for as long as you  are taking the medicine. Osteoporosis and menopause Osteoporosis is a disease in which the bones lose minerals and strength with aging. This can result in bone fractures. If you are 4 years old or older, or if you are at risk for osteoporosis and fractures, ask your health care provider if you should: Be screened for bone loss. Take a calcium or vitamin D supplement to lower your risk of fractures. Be given hormone replacement therapy (HRT) to treat symptoms of menopause. Follow these instructions at home: Alcohol use Do not drink alcohol if: Your health care provider tells you not to drink. You are pregnant, may be pregnant, or are planning to become pregnant. If you drink alcohol: Limit how much you have to: 0-1 drink a day. Know how much alcohol is in your drink. In the [place] one drink equals one 12 oz bottle of beer (355 mL), one 5 oz glass of wine (148 mL), or one 1 oz glass of hard liquor (44 mL). Lifestyle Do not use any products that contain nicotine or tobacco. These products include cigarettes, chewing tobacco, and vaping devices, such as e-cigarettes. If you need help quitting, ask your health care provider. Do not use street drugs. Do not share needles. Ask your health care provider for help if you need support or information about quitting drugs. General instructions Schedule regular health, dental, and eye exams. Stay current with your vaccines. Tell your health care provider if: You often feel depressed. You have ever been abused or do not feel safe at home. Summary Adopting a healthy lifestyle and getting preventive care are important in promoting health and wellness. Follow your health care provider's instructions about healthy diet, exercising, and getting tested or screened for diseases. Follow your health care provider's instructions on monitoring your cholesterol and blood pressure. This information is not intended to replace advice given to you by your health  care provider. Make sure you discuss any questions you have with your health care provider. Document Revised: 06/07/2020 Document Reviewed: 06/07/2020 Elsevier Patient Education  2024 [employer].

## 2023-05-17 NOTE — Assessment & Plan Note (Signed)
 Cervical cancer screening performed according to ASCCP guidelines. Encouraged annual mammogram screening Colonoscopy UTD DXA N/A Labs and immunizations with her primary Encouraged safe sexual practices as indicated Encouraged healthy lifestyle practices with diet and exercise For patients under 50-58yo, I recommend 1200mg  calcium daily and 600IU of vitamin D daily.

## 2023-05-17 NOTE — Progress Notes (Signed)
 58 y.o. [id number] female with [employer] here for annual exam. Married.  Patient's last menstrual period was 01/12/2014 (lmp unknown).   No complaints, doing well.  Happy with vaginal estrogen tablets however wondering if she can get a cheaper product.  Abnormal bleeding: none Pelvic discharge or pain: none Breast mass, nipple discharge or skin changes : none  Last PAP:     Component Value Date/Time   DIAGPAP  04/12/2018 0000    NEGATIVE FOR INTRAEPITHELIAL LESIONS OR MALIGNANCY.   ADEQPAP  04/12/2018 0000    Satisfactory for evaluation  endocervical/transformation zone component ABSENT.   Last mammogram: 4/324, BI-RADS 1 , density B Last colonoscopy: 12/19/2016, every 10 years Sexually active: Yes Exercising: Yes, running strength training Smoker: No  Flowsheet Row Office Visit from [facility]/17/2023 [facility]  PHQ-2 Total Score 0        GYN HISTORY: No significant history  OB History  Gravida Para Term Preterm AB Living  3 3 3   3   SAB IAB Ectopic Multiple Live Births          # Outcome Date GA Lbr Len/2nd Weight Sex Type Anes PTL Lv  3 Term 2002    F CS-Unspec     2 Term 2001    M CS-Unspec     1 Term 1999    M CS-Unspec       Past Medical History:  Diagnosis Date   Chicken pox    Chronic kidney disease 4/09   Kidney stones   Leukopenia    Lymphoma (HCC)    Menopausal state 07/09/2013   GYN note 04/29/2015  Discussed risks/benefits/side effects and expectations with vaginal dryness.Discussed can be used in cream form, tablet or ring. Would recommend tablet due to low dose to start with. Questions addressed about using coconut oil also with use and discussed can be used without problems.Patient would like to use tablet form to try.  Rx Vagifem with instructions to use one tablet at   Migraine    Mucosa-associated lymphoid tissue (MALT) lymphoma 04/21/2020   Multiple pulmonary nodules 03/19/2015   Primary osteoarthritis involving  multiple joints 07/24/2014   Vitamin D deficiency 03/29/2021   Well woman exam with routine gynecological exam 05/17/2023    Past Surgical History:  Procedure Laterality Date   BRONCHIAL BIOPSY  11/28/2019   Procedure: BRONCHIAL BIOPSIES;  Surgeon: Josephine Igo, DO;  Location: [facility];  Service: Pulmonary;;   BRONCHIAL BRUSHINGS  11/28/2019   Procedure: BRONCHIAL BRUSHINGS;  Surgeon: Josephine Igo, DO;  Location: [facility];  Service: Pulmonary;;   BRONCHIAL NEEDLE ASPIRATION BIOPSY  11/28/2019   Procedure: BRONCHIAL NEEDLE ASPIRATION BIOPSIES;  Surgeon: Josephine Igo, DO;  Location: [facility];  Service: Pulmonary;;   BRONCHIAL WASHINGS  11/28/2019   Procedure: BRONCHIAL WASHINGS;  Surgeon: Josephine Igo, DO;  Location: [facility];  Service: Pulmonary;;   CESAREAN SECTION     x3   COLONOSCOPY     EXTRACORPOREAL SHOCK WAVE LITHOTRIPSY Right 04/19/2017   Procedure: RIGHT EXTRACORPOREAL SHOCK WAVE LITHOTRIPSY (ESWL);  Surgeon: Rene Paci, MD;  Location: [facility];  Service: Urology;  Laterality: Right;   EYE SURGERY  March 2006   Lasix   FIDUCIAL MARKER PLACEMENT  11/28/2019   Procedure: FIDUCIAL MARKER PLACEMENT;  Surgeon: Josephine Igo, DO;  Location: [facility];  Service: Pulmonary;;   INTERCOSTAL NERVE BLOCK Right 03/11/2020   Procedure: INTERCOSTAL NERVE BLOCK;  Surgeon: Loreli Slot, MD;  Location: [facility];  Service: Thoracic;  Laterality: Right;   LAPAROSCOPIC CHOLECYSTECTOMY  2001   LUMBAR LAMINECTOMY  01/2014   herniated disc   LYMPH NODE DISSECTION N/A 03/11/2020   Procedure: LYMPH NODE DISSECTION;  Surgeon: Zelphia Higashi, MD;  Location: [facility];  Service: Thoracic;  Laterality: N/A;   REFRACTIVE SURGERY Bilateral 2006   lasik   SPINE SURGERY     VIDEO BRONCHOSCOPY WITH ENDOBRONCHIAL NAVIGATION N/A 11/28/2019   Procedure: VIDEO BRONCHOSCOPY WITH ENDOBRONCHIAL NAVIGATION;  Surgeon: Prudy Brownie, DO;  Location: [facility];  Service: Pulmonary;  Laterality: N/A;   WISDOM TOOTH EXTRACTION     XI ROBOTIC ASSISTED THORASCOPY - RIGHT LOWER LOBE WEDGE RESECTION (Right)  03/11/2020   path pending. lung nodules    Current Outpatient Medications on File Prior to Visit  Medication Sig Dispense Refill   Calcium Carbonate-Vitamin D 600-400 MG-UNIT tablet Take 1 tablet by mouth daily.     loratadine (CLARITIN) 5 MG chewable tablet Chew 5 mg by mouth daily.     Multiple Vitamins-Minerals (MULTIVITAMIN PO) Take 1 tablet by mouth daily.      Omega-3 Fatty Acids (FISH OIL PO) Take 1 tablet by mouth daily.      No current facility-administered medications on file prior to visit.    Social History   Socioeconomic History   Marital status: Married    Spouse name: Not on file   Number of children: 3   Years of education: Masters De   Highest education level: Not on file  Occupational History   Occupation: Physical Therapist    Employer: [employer]  Tobacco Use   Smoking status: Never    Passive exposure: Never   Smokeless tobacco: Never  Vaping Use   Vaping status: Never Used  Substance and Sexual Activity   Alcohol use: Yes    Alcohol/week: 6.0 standard drinks of alcohol    Types: 6 Standard drinks or equivalent per week    Comment: social   Drug use: No   Sexual activity: Yes    Partners: Male    Birth control/protection: Post-menopausal    Comment: husband vasectomy  Other Topics Concern   Not on file  Social History Narrative   Marital status/children/pets: Married G3P3   Education/employment: Masters degree.  Works as a Wellsite geologist:      -smoke alarm in the home:Yes     - wears seatbelt: Yes     - Feels safe in their relationships: Yes      Social Drivers of Corporate investment banker Strain: Not on file  Food Insecurity: Not on file  Transportation Needs: Not on file  Physical Activity: Not on file  Stress: Not on file  Social Connections: Not on file   Intimate Partner Violence: Not on file    Family History  Problem Relation Age of Onset   Hyperlipidemia Mother    Alcohol abuse Mother    Arthritis Mother    Alzheimer's disease Mother    Hypertension Father    Parkinson's disease Father    Alcohol abuse Father    Kidney Stones Brother    Alcohol abuse Brother    Dementia Maternal Grandmother    Heart disease Paternal Grandfather    Parkinson's disease Paternal Grandfather     No Known Allergies    PE Today's Vitals   05/17/23 1123  BP: 118/64  Pulse: 67  SpO2: 99%  Weight: 136 lb (61.7 kg)  Height:  5' 7.75" (1.721 m)   Body mass index is 20.83 kg/m.  Physical Exam Vitals reviewed. Exam conducted with a chaperone present.  Constitutional:      General: She is not in acute distress.    Appearance: Normal appearance.  HENT:     Head: Normocephalic and atraumatic.     Nose: Nose normal.  Eyes:     Extraocular Movements: Extraocular movements intact.     Conjunctiva/sclera: Conjunctivae normal.  Neck:     Thyroid: No thyroid mass, thyromegaly or thyroid tenderness.  Pulmonary:     Effort: Pulmonary effort is normal.  Chest:     Chest wall: No mass or tenderness.  Breasts:    Right: Normal. No swelling, mass, nipple discharge, skin change or tenderness.     Left: Normal. No swelling, mass, nipple discharge, skin change or tenderness.  Abdominal:     General: There is no distension.     Palpations: Abdomen is soft.     Tenderness: There is no abdominal tenderness.  Genitourinary:    General: Normal vulva.     Exam position: Lithotomy position.     Urethra: No prolapse.     Vagina: Normal. No vaginal discharge or bleeding.     Cervix: Normal. No lesion.     Uterus: Normal. Not enlarged and not tender.      Adnexa: Right adnexa normal and left adnexa normal.  Musculoskeletal:        General: Normal range of motion.     Cervical back: Normal range of motion.  Lymphadenopathy:     Upper Body:     Right  upper body: No axillary adenopathy.     Left upper body: No axillary adenopathy.     Lower Body: No right inguinal adenopathy. No left inguinal adenopathy.  Skin:    General: Skin is warm and dry.  Neurological:     General: No focal deficit present.     Mental Status: She is alert.  Psychiatric:        Mood and Affect: Mood normal.        Behavior: Behavior normal.       Assessment and Plan:        Well woman exam with routine gynecological exam Assessment & Plan: Cervical cancer screening performed according to ASCCP guidelines. Encouraged annual mammogram screening Colonoscopy UTD DXA N/A Labs and immunizations with her primary Encouraged safe sexual practices as indicated Encouraged healthy lifestyle practices with diet and exercise For patients under 50-70yo, I recommend 1200mg  calcium daily and 600IU of vitamin D daily.    Cervical cancer screening -     Cytology - PAP  Negative depression screening  Vaginal atrophy -     Estradiol; Place 1 tablet (10 mcg total) vaginally 2 (two) times a week.  Dispense: 24 tablet; Refill: 4  Discussed Imvexxy, Estring, premarin as alternatives.  Patient will inquire with insurance about cost difference.  Romaine Closs, MD

## 2023-05-18 ENCOUNTER — Other Ambulatory Visit (HOSPITAL_BASED_OUTPATIENT_CLINIC_OR_DEPARTMENT_OTHER): Payer: Self-pay | Admitting: Obstetrics and Gynecology

## 2023-05-18 DIAGNOSIS — Z1231 Encounter for screening mammogram for malignant neoplasm of breast: Secondary | ICD-10-CM

## 2023-05-22 ENCOUNTER — Encounter: Payer: Self-pay | Admitting: Obstetrics and Gynecology

## 2023-05-22 LAB — CYTOLOGY - PAP
Adequacy: ABSENT
Comment: NEGATIVE
Diagnosis: NEGATIVE
High risk HPV: NEGATIVE

## 2023-05-24 ENCOUNTER — Ambulatory Visit

## 2023-05-24 DIAGNOSIS — Z1231 Encounter for screening mammogram for malignant neoplasm of breast: Secondary | ICD-10-CM | POA: Diagnosis not present

## 2023-05-28 ENCOUNTER — Encounter: Payer: Self-pay | Admitting: Obstetrics and Gynecology

## 2023-09-24 ENCOUNTER — Encounter: Payer: Self-pay | Admitting: Internal Medicine

## 2023-10-02 ENCOUNTER — Ambulatory Visit: Admitting: Family Medicine

## 2023-10-02 ENCOUNTER — Ambulatory Visit
Admission: RE | Admit: 2023-10-02 | Discharge: 2023-10-02 | Disposition: A | Discharge status: Home / Self Care | Attending: Family Medicine | Admitting: Family Medicine

## 2023-10-02 VITALS — BP 121/61 | HR 62 | Temp 97.8°F | Resp 17

## 2023-10-02 DIAGNOSIS — R1011 Right upper quadrant pain: Secondary | ICD-10-CM

## 2023-10-02 NOTE — Discharge Instructions (Signed)
 Advised patient given 1 month of symptoms please follow-up with your PCP for probable CT of abdomen/pelvis for further characterization of right upper quadrant pain tomorrow, Wednesday, 10/03/2023.  Advised patient that I have spoken with her PCP and she is opening appointment for her tomorrow for scheduling now.

## 2023-10-02 NOTE — ED Triage Notes (Signed)
 Pt c/o intermittent RUQ pain x 1 month. Worsening in last month. Denies N/V/D. Dry needling on Fri with no relief. Tylenol  prn.

## 2023-10-02 NOTE — ED Provider Notes (Signed)
 [facility]    CSN: [id number] Arrival date & time: 10/02/23  1448      History   Chief Complaint Chief Complaint  Patient presents with   Abdominal Pain    RUQ, spasm    HPI Laura Ritter is a 58 y.o. female.   HPI 58 year old female presents with right upper quadrant pain/spasm/discomfort for 1 month.  Patient reports performing dry needling on Friday with no relief.  PMH significant for lymphoma, CKD, and primary osteoarthritis involving multiple joints  Past Medical History:  Diagnosis Date   Chicken pox    Chronic kidney disease 4/09   Kidney stones   Leukopenia    Lymphoma (HCC)    Menopausal state 07/09/2013   GYN note 04/29/2015  Discussed risks/benefits/side effects and expectations with vaginal dryness.Discussed can be used in cream form, tablet or ring. Would recommend tablet due to low dose to start with. Questions addressed about using coconut oil also with use and discussed can be used without problems.Patient would like to use tablet form to try.  Rx Vagifem  with instructions to use one tablet at   Migraine    Mucosa-associated lymphoid tissue (MALT) lymphoma 04/21/2020   Multiple pulmonary nodules 03/19/2015   Primary osteoarthritis involving multiple joints 07/24/2014   Vitamin D  deficiency 03/29/2021   Well woman exam with routine gynecological exam 05/17/2023    Patient Active Problem List   Diagnosis Date Noted   Well woman exam with routine gynecological exam 05/17/2023   Leukopenia    Lymphoma (HCC)    Migraine    Vitamin D  deficiency 03/29/2021   Mucosa-associated lymphoid tissue (MALT) lymphoma 04/21/2020   Frequent PVCs 04/01/2015   Multiple pulmonary nodules 03/19/2015   Palpitations 03/01/2015   Primary osteoarthritis involving multiple joints 07/24/2014   Menopausal state 07/09/2013    Past Surgical History:  Procedure Laterality Date   BRONCHIAL BIOPSY  11/28/2019   Procedure: BRONCHIAL BIOPSIES;  Surgeon: Brenna Adine CROME, DO;  Location: [facility];  Service: Pulmonary;;   BRONCHIAL BRUSHINGS  11/28/2019   Procedure: BRONCHIAL BRUSHINGS;  Surgeon: Brenna Adine CROME, DO;  Location: [facility];  Service: Pulmonary;;   BRONCHIAL NEEDLE ASPIRATION BIOPSY  11/28/2019   Procedure: BRONCHIAL NEEDLE ASPIRATION BIOPSIES;  Surgeon: Brenna Adine CROME, DO;  [facility]: [facility];  Service: Pulmonary;;   BRONCHIAL WASHINGS  11/28/2019   Procedure: BRONCHIAL WASHINGS;  Surgeon: Brenna Adine CROME, DO;  Location: [facility];  Service: Pulmonary;;   CESAREAN SECTION     x3   COLONOSCOPY     EXTRACORPOREAL SHOCK WAVE LITHOTRIPSY Right 04/19/2017   Procedure: RIGHT EXTRACORPOREAL SHOCK WAVE LITHOTRIPSY (ESWL);  Surgeon: Devere Lonni Righter, MD;  Location: [facility];  Service: Urology;  Laterality: Right;   EYE SURGERY  March 2006   Lasix   FIDUCIAL MARKER PLACEMENT  11/28/2019   Procedure: FIDUCIAL MARKER PLACEMENT;  Surgeon: Brenna Adine CROME, DO;  [facility]: [facility];  Service: Pulmonary;;   INTERCOSTAL NERVE BLOCK Right 03/11/2020   Procedure: INTERCOSTAL NERVE BLOCK;  Surgeon: Kerrin Elspeth BROCKS, MD;  Location: [facility] OR;  Service: Thoracic;  Laterality: Right;   LAPAROSCOPIC CHOLECYSTECTOMY  2001   LUMBAR LAMINECTOMY  01/2014   herniated disc   LYMPH NODE DISSECTION N/A 03/11/2020   Procedure: LYMPH NODE DISSECTION;  Surgeon: Kerrin Elspeth BROCKS, MD;  [facility];  Service: Thoracic;  Laterality: N/A;   REFRACTIVE SURGERY Bilateral 2006   lasik   SPINE SURGERY     VIDEO BRONCHOSCOPY WITH ENDOBRONCHIAL  NAVIGATION N/A 11/28/2019   Procedure: VIDEO BRONCHOSCOPY WITH ENDOBRONCHIAL NAVIGATION;  Surgeon: Brenna Adine CROME, DO;  Location: [facility];  Service: Pulmonary;  Laterality: N/A;   WISDOM TOOTH EXTRACTION     XI ROBOTIC ASSISTED THORASCOPY - RIGHT LOWER LOBE WEDGE RESECTION (Right)  03/11/2020   path pending. lung nodules    OB History     Gravida  3   Para  3   Term  3   Preterm       AB      Living  3      SAB      IAB      Ectopic      Multiple      Live Births               Home Medications    Prior to Admission medications   Medication Sig Start Date End Date Taking? Authorizing Provider  Calcium Carbonate-Vitamin D  600-400 MG-UNIT tablet Take 1 tablet by mouth daily.    [provider]  Estradiol  10 MCG TABS vaginal tablet Place 1 tablet (10 mcg total) vaginally 2 (two) times a week. 05/17/23   Dallie Vera GAILS, MD  loratadine (CLARITIN) 5 MG chewable tablet Chew 5 mg by mouth daily.    [provider]  Multiple Vitamins-Minerals (MULTIVITAMIN PO) Take 1 tablet by mouth daily.     [provider]  Omega-3 Fatty Acids (FISH OIL PO) Take 1 tablet by mouth daily.     [provider]    Family History Family History  Problem Relation Age of Onset   Hyperlipidemia Mother    Alcohol abuse Mother    Arthritis Mother    Alzheimer's disease Mother    Hypertension Father    Parkinson's disease Father    Alcohol abuse Father    Kidney Stones Brother    Alcohol abuse Brother    Dementia Maternal Grandmother    Heart disease Paternal Grandfather    Parkinson's disease Paternal Grandfather     Social History Social History   Tobacco Use   Smoking status: Never    Passive exposure: Never   Smokeless tobacco: Never  Vaping Use   Vaping status: Never Used  Substance Use Topics   Alcohol use: Yes    Alcohol/week: 6.0 standard drinks of alcohol    Types: 6 Standard drinks or equivalent per week    Comment: social   Drug use: No     Allergies   Patient has no known allergies.   Review of Systems Review of Systems  Gastrointestinal:  Positive for abdominal pain.  All other systems reviewed and are negative.    Physical Exam Triage Vital Signs ED Triage Vitals [10/02/23 1504]  Encounter Vitals Group     BP 121/61     Girls Systolic BP Percentile      Girls Diastolic BP Percentile      Boys  Systolic BP Percentile      Boys Diastolic BP Percentile      Pulse Rate 62     Resp 17     Temp 97.8 F (36.6 C)     Temp Source Oral     SpO2 99 %     Weight      Height      Head Circumference      Peak Flow      Pain Score 2     Pain Loc      Pain Education  Exclude from Growth Chart    No data found.  Updated Vital Signs BP 121/61 (BP Location: Right Arm)   Pulse 62   Temp 97.8 F (36.6 C) (Oral)   Resp 17   LMP 01/12/2014 (LMP Unknown)   SpO2 99%    Physical Exam Vitals and nursing note reviewed.  Constitutional:      Appearance: She is well-developed and normal weight.  HENT:     Head: Normocephalic and atraumatic.     Mouth/Throat:     Mouth: Mucous membranes are moist.     Pharynx: Oropharynx is clear.  Eyes:     Extraocular Movements: Extraocular movements intact.     Pupils: Pupils are equal, round, and reactive to light.  Cardiovascular:     Rate and Rhythm: Normal rate and regular rhythm.     Heart sounds: Normal heart sounds. No murmur heard. Abdominal:     General: Bowel sounds are normal. There is no distension.     Palpations: Abdomen is soft.     Tenderness: There is abdominal tenderness in the right upper quadrant. There is no right CVA tenderness, guarding or rebound. Negative signs include Murphy's sign, Rovsing's sign and psoas sign.     Hernia: No hernia is present.  Skin:    General: Skin is warm and dry.  Neurological:     General: No focal deficit present.     Mental Status: She is alert and oriented to person, place, and time.      UC Treatments / Results  Labs (all labs ordered are listed, but only abnormal results are displayed) Labs Reviewed - No data to display  EKG   Radiology No results found.  Procedures Procedures (including critical care time)  Medications Ordered in UC Medications - No data to display  Initial Impression / Assessment and Plan / UC Course  I have reviewed the triage vital signs and the  nursing notes.  Pertinent labs & imaging results that were available during my care of the patient were reviewed by me and considered in my medical decision making (see chart for details).     MDM: 1.  Abdominal pain, right upper quadrant-advised patient given 1 month of symptoms please follow-up with your PCP for probable CT of abdomen/pelvis for further characterization of right upper quadrant pain tomorrow, Wednesday, 10/03/2023.  Advised patient that I have spoken with her PCP and she is opening appointment for her tomorrow for scheduling now.  Patient discharged home, hemodynamically stable.  Final Clinical Impressions(s) / UC Diagnoses   Final diagnoses:  Abdominal pain, right upper quadrant     Discharge Instructions      Advised patient given 1 month of symptoms please follow-up with your PCP for probable CT of abdomen/pelvis for further characterization of right upper quadrant pain tomorrow, Wednesday, 10/03/2023.  Advised patient that I have spoken with her PCP and she is opening appointment for her tomorrow for scheduling now.      ED Prescriptions   None    PDMP not reviewed this encounter.   Teddy Sharper, FNP 10/02/23 1623

## 2023-10-03 ENCOUNTER — Ambulatory Visit (INDEPENDENT_AMBULATORY_CARE_PROVIDER_SITE_OTHER)

## 2023-10-03 ENCOUNTER — Encounter: Payer: Self-pay | Admitting: Urgent Care

## 2023-10-03 ENCOUNTER — Ambulatory Visit (INDEPENDENT_AMBULATORY_CARE_PROVIDER_SITE_OTHER): Admitting: Urgent Care

## 2023-10-03 ENCOUNTER — Ambulatory Visit: Payer: Self-pay | Admitting: Urgent Care

## 2023-10-03 VITALS — BP 120/66 | HR 62 | Resp 20 | Ht 67.5 in | Wt 140.0 lb

## 2023-10-03 DIAGNOSIS — R911 Solitary pulmonary nodule: Secondary | ICD-10-CM

## 2023-10-03 DIAGNOSIS — C884 Extranodal marginal zone b-cell lymphoma of mucosa-associated lymphoid tissue (malt-lymphoma) not having achieved remission: Secondary | ICD-10-CM

## 2023-10-03 DIAGNOSIS — R1013 Epigastric pain: Secondary | ICD-10-CM

## 2023-10-03 DIAGNOSIS — N1330 Unspecified hydronephrosis: Secondary | ICD-10-CM

## 2023-10-03 DIAGNOSIS — N200 Calculus of kidney: Secondary | ICD-10-CM

## 2023-10-03 DIAGNOSIS — R1011 Right upper quadrant pain: Secondary | ICD-10-CM

## 2023-10-03 DIAGNOSIS — M62838 Other muscle spasm: Secondary | ICD-10-CM

## 2023-10-03 MED ORDER — IOHEXOL 9 MG/ML PO SOLN
500.0000 mL | ORAL | Status: AC
  Administered 2023-10-03 (×2): 500 mL via ORAL

## 2023-10-03 MED ORDER — TIZANIDINE HCL 4 MG PO TABS: 4.0000 mg | ORAL_TABLET | Freq: Four times a day (QID) | ORAL | 0 refills | Status: DC | PRN

## 2023-10-03 MED ORDER — TAMSULOSIN HCL 0.4 MG PO CAPS: 0.4000 mg | ORAL_CAPSULE | Freq: Every day | ORAL | 3 refills | Status: DC

## 2023-10-03 MED ORDER — OMEPRAZOLE 20 MG PO CPDR: 20.0000 mg | DELAYED_RELEASE_CAPSULE | Freq: Every day | ORAL | 0 refills | Status: DC

## 2023-10-03 NOTE — Patient Instructions (Addendum)
 We completed an H pylori breath test today. Results will be available on Mychart. Given your history of lymphoma, we ordered a CT scan of your abdomen/ pelvis.  Please go to [address] to complete the CT scan.  I have called in omeprazole  to take for your epigastric pain. This is best taken 30 min prior to breakfast in the morning. Additionally, I have called in tizanidine  to see if this is muscular in nature. Take every 6-8 hours as needed, use with caution as it can cause drowsiness.

## 2023-10-03 NOTE — Progress Notes (Signed)
 Established Patient Office Visit  Subjective:  Patient ID: Laura Ritter, female    DOB: [birth year]  Age: 58 y.o. MRN: [id number]  Chief Complaint  Patient presents with   Follow-up    Spasms in the upper right side of abdomen, near diaphragm     Pleasant 57yo female with hx of MALT lymphoma presents today for urgent follow up after being seen in UC yesterday. Pt has had 4 days of rather unbearable RUQ pain and somewhat in the epigastrum. She was in uc yesterday unable to sit down due to the pain. She reports today it is actually much better, but still present. She reports it feels almost like a spasm of the diaphragm. She did try OTC meds with no relief. She has felt a bit gassy/ bloated recently. Had a cholecystectomy years ago. Some epigastric discomfort, but no hearburn, diarrhea or constipation. No N/V, fever or chills. No weight loss. No urinary symptoms. She has her routine annual oncology follow up scheduled later this month with labs and CXR.      Patient Active Problem List   Diagnosis Date Noted   Well woman exam with routine gynecological exam 05/17/2023   Leukopenia    Lymphoma (HCC)    Migraine    Vitamin D  deficiency 03/29/2021   Mucosa-associated lymphoid tissue (MALT) lymphoma 04/21/2020   Frequent PVCs 04/01/2015   Multiple pulmonary nodules 03/19/2015   Palpitations 03/01/2015   Primary osteoarthritis involving multiple joints 07/24/2014   Menopausal state 07/09/2013   Past Medical History:  Diagnosis Date   Chicken pox    Chronic kidney disease 4/09   Kidney stones   Leukopenia    Lymphoma (HCC)    Menopausal state 07/09/2013   GYN note 04/29/2015  Discussed risks/benefits/side effects and expectations with vaginal dryness.Discussed can be used in cream form, tablet or ring. Would recommend tablet due to low dose to start with. Questions addressed about using coconut oil also with use and discussed can be used without problems.Patient would like to use  tablet form to try.  Rx Vagifem  with instructions to use one tablet at   Migraine    Mucosa-associated lymphoid tissue (MALT) lymphoma 04/21/2020   Multiple pulmonary nodules 03/19/2015   Primary osteoarthritis involving multiple joints 07/24/2014   Vitamin D  deficiency 03/29/2021   Well woman exam with routine gynecological exam 05/17/2023   Past Surgical History:  Procedure Laterality Date   BRONCHIAL BIOPSY  11/28/2019   Procedure: BRONCHIAL BIOPSIES;  Surgeon: Brenna Adine CROME, DO;  Location: [facility];  Service: Pulmonary;;   BRONCHIAL BRUSHINGS  11/28/2019   Procedure: BRONCHIAL BRUSHINGS;  Surgeon: Brenna Adine CROME, DO;  Location: [facility];  Service: Pulmonary;;   BRONCHIAL NEEDLE ASPIRATION BIOPSY  11/28/2019   Procedure: BRONCHIAL NEEDLE ASPIRATION BIOPSIES;  Surgeon: Brenna Adine CROME, DO;  Location: [facility];  Service: Pulmonary;;   BRONCHIAL WASHINGS  11/28/2019   Procedure: BRONCHIAL WASHINGS;  Surgeon: Brenna Adine CROME, DO;  Location: [facility];  Service: Pulmonary;;   CESAREAN SECTION     x3   COLONOSCOPY     EXTRACORPOREAL SHOCK WAVE LITHOTRIPSY Right 04/19/2017   Procedure: RIGHT EXTRACORPOREAL SHOCK WAVE LITHOTRIPSY (ESWL);  Surgeon: Devere Lonni Righter, MD;  Location: [facility];  Service: Urology;  Laterality: Right;   EYE SURGERY  March 2006   Lasix   FIDUCIAL MARKER PLACEMENT  11/28/2019   Procedure: FIDUCIAL MARKER PLACEMENT;  Surgeon: Brenna Adine CROME, DO;  Location: [facility];  Service: Pulmonary;;   INTERCOSTAL  NERVE BLOCK Right 03/11/2020   Procedure: INTERCOSTAL NERVE BLOCK;  Surgeon: Kerrin Elspeth BROCKS, MD;  Location: [facility];  Service: Thoracic;  Laterality: Right;   LAPAROSCOPIC CHOLECYSTECTOMY  2001   LUMBAR LAMINECTOMY  01/2014   herniated disc   LYMPH NODE DISSECTION N/A 03/11/2020   Procedure: LYMPH NODE DISSECTION;  Surgeon: Kerrin Elspeth BROCKS, MD;  Location: [facility];  Service: Thoracic;  Laterality: N/A;   REFRACTIVE SURGERY  Bilateral 2006   lasik   SPINE SURGERY     VIDEO BRONCHOSCOPY WITH ENDOBRONCHIAL NAVIGATION N/A 11/28/2019   Procedure: VIDEO BRONCHOSCOPY WITH ENDOBRONCHIAL NAVIGATION;  Surgeon: Brenna Adine CROME, DO;  Location: [facility];  Service: Pulmonary;  Laterality: N/A;   WISDOM TOOTH EXTRACTION     XI ROBOTIC ASSISTED THORASCOPY - RIGHT LOWER LOBE WEDGE RESECTION (Right)  03/11/2020   path pending. lung nodules   Social History   Tobacco Use   Smoking status: Never    Passive exposure: Never   Smokeless tobacco: Never  Vaping Use   Vaping status: Never Used  Substance Use Topics   Alcohol use: Yes    Alcohol/week: 6.0 standard drinks of alcohol    Types: 6 Standard drinks or equivalent per week    Comment: social   Drug use: No      ROS: as noted in HPI  Objective:     BP 120/66 (BP Location: Right Arm, Patient Position: Sitting, Cuff Size: Normal)   Pulse 62   Resp 20   Ht 5' 7.5 (1.715 m)   Wt 140 lb (63.5 kg)   LMP 01/12/2014 (LMP Unknown)   SpO2 99%   BMI 21.60 kg/m  BP Readings from Last 3 Encounters:  10/03/23 120/66  10/02/23 121/61  05/17/23 118/64   Wt Readings from Last 3 Encounters:  10/03/23 140 lb (63.5 kg)  05/17/23 136 lb (61.7 kg)  02/28/23 136 lb (61.7 kg)      Physical Exam Vitals and nursing note reviewed.  Constitutional:      General: She is not in acute distress.    Appearance: Normal appearance. She is normal weight. She is not ill-appearing, toxic-appearing or diaphoretic.  HENT:     Head: Normocephalic and atraumatic.  Cardiovascular:     Rate and Rhythm: Normal rate.  Pulmonary:     Effort: Pulmonary effort is normal. No respiratory distress.  Abdominal:     General: A surgical scar is present. Bowel sounds are normal. There is no distension.     Palpations: Abdomen is soft. There is no hepatomegaly or splenomegaly.     Tenderness: There is abdominal tenderness (mild discomfort without signs of peritonitis or acute abdomen) in  the right upper quadrant and epigastric area.      Comments: Percussion reveals tympany throughout  Neurological:     Mental Status: She is alert.      No results found for any visits on 10/03/23.    The 10-year ASCVD risk score (Arnett DK, et al., 2019) is: 1.5%  Assessment & Plan:  Epigastric pain -     H. pylori breath test -     CT ABDOMEN PELVIS WO CONTRAST; Future -     CMP14+EGFR -     CBC with Differential/Platelet -     Omeprazole ; Take 1 capsule (20 mg total) by mouth daily.  Dispense: 30 capsule; Refill: 0  Mucosa-associated lymphoid tissue (MALT) lymphoma -     CT ABDOMEN PELVIS WO CONTRAST; Future -     CMP14+EGFR -  CBC with Differential/Platelet  Muscle spasm -     tiZANidine  HCl; Take 1 tablet (4 mg total) by mouth every 6 (six) hours as needed for muscle spasms.  Dispense: 30 tablet; Refill: 0  Given patients history, will obtain STAT CT scan abd/pelvis to ensure there is no malignancy related changes. Pt does have sx that may mimic H pylori. She has not been on PPI so will obtain breath test today. Will start omeprazole  after collecting sample. Additional labs obtained today. Will also do trial of tizanidine . Pt stating it feels like a spasm, also admits sx are much improved today than yesterday.   No follow-ups on file.   Benton LITTIE Gave, PA

## 2023-10-04 LAB — CBC WITH DIFFERENTIAL/PLATELET
Basophils Absolute: 0 x10E3/uL (ref 0.0–0.2)
Basos: 1 %
EOS (ABSOLUTE): 0.1 x10E3/uL (ref 0.0–0.4)
Eos: 2 %
Hematocrit: 42.8 % (ref 34.0–46.6)
Hemoglobin: 14.1 g/dL (ref 11.1–15.9)
Immature Grans (Abs): 0 x10E3/uL (ref 0.0–0.1)
Immature Granulocytes: 0 %
Lymphocytes Absolute: 0.8 x10E3/uL (ref 0.7–3.1)
Lymphs: 26 %
MCH: 30 pg (ref 26.6–33.0)
MCHC: 32.9 g/dL (ref 31.5–35.7)
MCV: 91 fL (ref 79–97)
Monocytes Absolute: 0.2 x10E3/uL (ref 0.1–0.9)
Monocytes: 8 %
Neutrophils Absolute: 2 x10E3/uL (ref 1.4–7.0)
Neutrophils: 63 %
Platelets: 171 x10E3/uL (ref 150–450)
RBC: 4.7 x10E6/uL (ref 3.77–5.28)
RDW: 13.1 % (ref 11.7–15.4)
WBC: 3.2 x10E3/uL — ABNORMAL LOW (ref 3.4–10.8)

## 2023-10-04 LAB — CMP14+EGFR
ALT: 23 IU/L (ref 0–32)
AST: 24 IU/L (ref 0–40)
Albumin: 4.7 g/dL (ref 3.8–4.9)
Alkaline Phosphatase: 53 IU/L (ref 44–121)
BUN/Creatinine Ratio: 18 (ref 9–23)
BUN: 18 mg/dL (ref 6–24)
Bilirubin Total: 0.5 mg/dL (ref 0.0–1.2)
CO2: 24 mmol/L (ref 20–29)
Calcium: 9.7 mg/dL (ref 8.7–10.2)
Chloride: 101 mmol/L (ref 96–106)
Creatinine, Ser: 0.99 mg/dL (ref 0.57–1.00)
Globulin, Total: 2.2 g/dL (ref 1.5–4.5)
Glucose: 69 mg/dL — ABNORMAL LOW (ref 70–99)
Potassium: 4.4 mmol/L (ref 3.5–5.2)
Sodium: 143 mmol/L (ref 134–144)
Total Protein: 6.9 g/dL (ref 6.0–8.5)
eGFR: 67 mL/min/1.73 (ref >59)

## 2023-10-05 LAB — H. PYLORI BREATH TEST

## 2023-10-09 ENCOUNTER — Encounter: Payer: Self-pay | Admitting: Internal Medicine

## 2023-10-09 ENCOUNTER — Other Ambulatory Visit (HOSPITAL_BASED_OUTPATIENT_CLINIC_OR_DEPARTMENT_OTHER): Payer: Self-pay

## 2023-10-09 MED ORDER — FLUZONE 0.5 ML IM SUSY
0.5000 mL | PREFILLED_SYRINGE | Freq: Once | INTRAMUSCULAR | 0 refills | Status: AC
Start: 2023-10-09 — End: 2023-10-10
  Filled 2023-10-09: qty 0.5, 1d supply, fill #0

## 2023-10-11 ENCOUNTER — Telehealth: Payer: Self-pay | Admitting: Urology

## 2023-10-11 NOTE — Telephone Encounter (Signed)
 Called and lvm for  patient to make them aware of apt, what info they need to bring photo id and insurance card. Plus to make them aware to be at office 15 mins early and to make them aware of late policy for new patient appts.

## 2023-10-12 ENCOUNTER — Encounter: Payer: Self-pay | Admitting: Urology

## 2023-10-12 ENCOUNTER — Ambulatory Visit (INDEPENDENT_AMBULATORY_CARE_PROVIDER_SITE_OTHER): Admitting: Urology

## 2023-10-12 VITALS — BP 109/68 | HR 72 | Ht 67.0 in | Wt 140.0 lb

## 2023-10-12 DIAGNOSIS — N1330 Unspecified hydronephrosis: Secondary | ICD-10-CM

## 2023-10-12 DIAGNOSIS — N200 Calculus of kidney: Secondary | ICD-10-CM | POA: Insufficient documentation

## 2023-10-12 HISTORY — DX: Calculus of kidney: N20.0

## 2023-10-12 LAB — MICROSCOPIC EXAMINATION

## 2023-10-12 LAB — URINALYSIS, ROUTINE W REFLEX MICROSCOPIC
Bilirubin, UA: NEGATIVE
Glucose, UA: NEGATIVE
Ketones, UA: NEGATIVE
Nitrite, UA: NEGATIVE
Protein,UA: NEGATIVE
Specific Gravity, UA: <=1.005 — AB (ref 1.005–1.030)
Urobilinogen, Ur: 0.2 mg/dL (ref 0.2–1.0)
pH, UA: 6 (ref 5.0–7.5)

## 2023-10-12 NOTE — Progress Notes (Signed)
 Assessment: 1. Nephrolithiasis   2. Hydronephrosis, right     Plan: I personally reviewed the patient's chart including provider notes, lab and imaging results. I personally reviewed the CT study from 9/25 with results as noted below.  The right hydronephrosis appears to be more consistent with a possible right UPJ obstruction.  There was some evidence of this on a CT scan from 2012 as well.  There is no obstructing stone causing the hydronephrosis.  The renal stones are based in the lower pole and are not causing any obstruction at this time.   I discussed options for management including continued observation, shockwave lithotripsy, and ureteroscopic laser lithotripsy.  Given the lower pole location of the stones, passage of stone fragments may be limited. I discussed further evaluation with CT with contrast.  Since she is not symptomatic at the present time, she would like to monitor her symptoms.  I think this is reasonable given her normal renal function. I do not recommend any treatment for the lower pole renal calculi at this time. Return to office as needed.  Chief Complaint:  Chief Complaint  Patient presents with   Nephrolithiasis    History of Present Illness:  Laura Ritter is a 58 y.o. female who is seen in consultation from [place], [place] for evaluation of nephrolithiasis and mild right hydronephrosis. She recently underwent evaluation for epigastric pain with CT abdomen and pelvis without contrast.  The study showed multiple nonobstructing renal calculi bilaterally with the largest stone or 2 adjacent stones in the inferior pole of the left kidney measuring up to 11 mm.  There was mild right hydronephrosis with transition at the UPJ without evidence of obstructing stone. Creatinine from 9/25: 0.99 She is not having any flank pain.  No lower urinary tract symptoms.  No dysuria or gross hematuria.  She has a history of nephrolithiasis and has previously had shockwave  lithotripsy in March 2019 for management of a right proximal ureteral calculus. Stone analysis: Calcium oxalate  CT abdomen and pelvis from 12/2010 showed bilateral nephrolithiasis with moderate right hydronephrosis and no right ureteral stone.  Past Medical History:  Past Medical History:  Diagnosis Date   Chicken pox    Chronic kidney disease 4/09   Kidney stones   Leukopenia    Lymphoma (HCC)    Menopausal state 07/09/2013   GYN note 04/29/2015  Discussed risks/benefits/side effects and expectations with vaginal dryness.Discussed can be used in cream form, tablet or ring. Would recommend tablet due to low dose to start with. Questions addressed about using coconut oil also with use and discussed can be used without problems.Patient would like to use tablet form to try.  Rx Vagifem  with instructions to use one tablet at   Migraine    Mucosa-associated lymphoid tissue (MALT) lymphoma 04/21/2020   Multiple pulmonary nodules 03/19/2015   Primary osteoarthritis involving multiple joints 07/24/2014   Vitamin D  deficiency 03/29/2021   Well woman exam with routine gynecological exam 05/17/2023    Past Surgical History:  Past Surgical History:  Procedure Laterality Date   BRONCHIAL BIOPSY  11/28/2019   Procedure: BRONCHIAL BIOPSIES;  Surgeon: Brenna Adine CROME, DO;  Location: [facility];  Service: Pulmonary;;   BRONCHIAL BRUSHINGS  11/28/2019   Procedure: BRONCHIAL BRUSHINGS;  Surgeon: Brenna Adine CROME, DO;  [facility]: [facility];  Service: Pulmonary;;   BRONCHIAL NEEDLE ASPIRATION BIOPSY  11/28/2019   Procedure: BRONCHIAL NEEDLE ASPIRATION BIOPSIES;  Surgeon: Brenna Adine CROME, DO;  Location: [facility];  Service: Pulmonary;;   BRONCHIAL WASHINGS  11/28/2019   Procedure: BRONCHIAL WASHINGS;  Surgeon: Brenna Adine CROME, DO;  [facility]: [facility];  Service: Pulmonary;;   CESAREAN SECTION     x3   COLONOSCOPY     EXTRACORPOREAL SHOCK WAVE LITHOTRIPSY Right 04/19/2017   Procedure:  RIGHT EXTRACORPOREAL SHOCK WAVE LITHOTRIPSY (ESWL);  Surgeon: Devere Lonni Righter, MD;  Location: [facility];  Service: Urology;  Laterality: Right;   EYE SURGERY  March 2006   Lasix   FIDUCIAL MARKER PLACEMENT  11/28/2019   Procedure: FIDUCIAL MARKER PLACEMENT;  Surgeon: Brenna Adine CROME, DO;  [facility]: [facility];  Service: Pulmonary;;   INTERCOSTAL NERVE BLOCK Right 03/11/2020   Procedure: INTERCOSTAL NERVE BLOCK;  Surgeon: Kerrin Elspeth BROCKS, MD;  Location: [facility];  Service: Thoracic;  Laterality: Right;   LAPAROSCOPIC CHOLECYSTECTOMY  2001   LUMBAR LAMINECTOMY  01/2014   herniated disc   LYMPH NODE DISSECTION N/A 03/11/2020   Procedure: LYMPH NODE DISSECTION;  Surgeon: Kerrin Elspeth BROCKS, MD;  Location: [facility];  Service: Thoracic;  Laterality: N/A;   REFRACTIVE SURGERY Bilateral 2006   lasik   SPINE SURGERY     VIDEO BRONCHOSCOPY WITH ENDOBRONCHIAL NAVIGATION N/A 11/28/2019   Procedure: VIDEO BRONCHOSCOPY WITH ENDOBRONCHIAL NAVIGATION;  Surgeon: Brenna Adine CROME, DO;  Location: [facility];  Service: Pulmonary;  Laterality: N/A;   WISDOM TOOTH EXTRACTION     XI ROBOTIC ASSISTED THORASCOPY - RIGHT LOWER LOBE WEDGE RESECTION (Right)  03/11/2020   path pending. lung nodules    Allergies:  No Known Allergies  Family History:  Family History  Problem Relation Age of Onset   Hyperlipidemia Mother    Alcohol abuse Mother    Arthritis Mother    Alzheimer's disease Mother    Hypertension Father    Parkinson's disease Father    Alcohol abuse Father    Kidney Stones Brother    Alcohol abuse Brother    Dementia Maternal Grandmother    Heart disease Paternal Grandfather    Parkinson's disease Paternal Grandfather     Social History:  Social History   Tobacco Use   Smoking status: Never    Passive exposure: Never   Smokeless tobacco: Never  Vaping Use   Vaping status: Never Used  Substance Use Topics   Alcohol use: Yes    Alcohol/week: 6.0 standard drinks of  alcohol    Types: 6 Standard drinks or equivalent per week    Comment: social   Drug use: No    Review of symptoms:  Constitutional:  Negative for unexplained weight loss, night sweats, fever, chills ENT:  Negative for nose bleeds, sinus pain, painful swallowing CV:  Negative for chest pain, shortness of breath, exercise intolerance, palpitations, loss of consciousness Resp:  Negative for cough, wheezing, shortness of breath GI:  Negative for nausea, vomiting, diarrhea, bloody stools GU:  Positives noted in HPI; otherwise negative for gross hematuria, dysuria, urinary incontinence Neuro:  Negative for seizures, poor balance, limb weakness, slurred speech Psych:  Negative for lack of energy, depression, anxiety Endocrine:  Negative for polydipsia, polyuria, symptoms of hypoglycemia (dizziness, hunger, sweating) Hematologic:  Negative for anemia, purpura, petechia, prolonged or excessive bleeding, use of anticoagulants  Allergic:  Negative for difficulty breathing or choking as a result of exposure to anything; no shellfish allergy; no allergic response (rash/itch) to materials, foods  Physical exam: BP 109/68   Pulse 72   Ht 5' 7 (1.702 m)   Wt 140 lb (63.5 kg)  LMP 01/12/2014 (LMP Unknown)   BMI 21.93 kg/m  GENERAL APPEARANCE:  Well appearing, well developed, well nourished, NAD HEENT: Atraumatic, Normocephalic, oropharynx clear. NECK: Supple without lymphadenopathy or thyromegaly. LUNGS: Clear to auscultation bilaterally. HEART: Regular Rate and Rhythm without murmurs, gallops, or rubs. ABDOMEN: Soft, non-tender, No Masses. EXTREMITIES: Moves all extremities well.  Without clubbing, cyanosis, or edema. NEUROLOGIC:  Alert and oriented x 3, normal gait, CN II-XII grossly intact.  MENTAL STATUS:  Appropriate. BACK:  Non-tender to palpation.  No CVAT SKIN:  Warm, dry and intact.    Results: U/A: 6-10 WBCs, 0-2 RBCs

## 2023-10-22 ENCOUNTER — Other Ambulatory Visit: Payer: 59

## 2023-10-22 ENCOUNTER — Ambulatory Visit (HOSPITAL_COMMUNITY)

## 2023-10-25 ENCOUNTER — Ambulatory Visit (HOSPITAL_COMMUNITY)
Admission: RE | Admit: 2023-10-25 | Discharge: 2023-10-25 | Disposition: A | Discharge status: Home / Self Care | Source: Ambulatory Visit | Attending: Internal Medicine | Admitting: Internal Medicine

## 2023-10-25 ENCOUNTER — Other Ambulatory Visit: Payer: Self-pay

## 2023-10-25 DIAGNOSIS — C884 Extranodal marginal zone b-cell lymphoma of mucosa-associated lymphoid tissue (malt-lymphoma) not having achieved remission: Secondary | ICD-10-CM | POA: Insufficient documentation

## 2023-10-29 ENCOUNTER — Inpatient Hospital Stay: Payer: 59 | Attending: Internal Medicine | Admitting: Internal Medicine

## 2023-10-29 VITALS — BP 118/76 | HR 59 | Temp 95.3°F | Resp 17 | Ht 67.0 in | Wt 142.7 lb

## 2023-10-29 DIAGNOSIS — K219 Gastro-esophageal reflux disease without esophagitis: Secondary | ICD-10-CM | POA: Insufficient documentation

## 2023-10-29 DIAGNOSIS — C884 Extranodal marginal zone b-cell lymphoma of mucosa-associated lymphoid tissue (malt-lymphoma) not having achieved remission: Secondary | ICD-10-CM | POA: Diagnosis not present

## 2023-10-29 DIAGNOSIS — R918 Other nonspecific abnormal finding of lung field: Secondary | ICD-10-CM | POA: Diagnosis not present

## 2023-10-29 DIAGNOSIS — Z79899 Other long term (current) drug therapy: Secondary | ICD-10-CM | POA: Insufficient documentation

## 2023-10-29 NOTE — Progress Notes (Signed)
 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 Kerrin.  CURRENT THERAPY: Observation  INTERVAL HISTORY: Laura Ritter 58 y.o. female returns to the clinic today for annual follow-up visit.  Discussed the use of AI scribe software for clinical note transcription with the patient, who gave verbal consent to proceed.  History of Present Illness Laura Ritter is a 58 year old female who presents for evaluation and repeat chest x-ray.  She underwent a wedge resection of the right lower lobe in February 2022 and has been under observation since then. A recent CT of the abdomen and pelvis revealed a small nodule in the right lower part of her lung, which is the same area where she had previous nodules removed.  She experiences abdominal spasms that were initially thought to be related to her abdominal muscles. These symptoms were severe enough to cause discomfort during a flight to [place] . Omeprazole  alleviated her symptoms. She has a history of stress-related stomach issues dating back to 2015, which were also relieved by omeprazole . No recent endoscopy, but a colonoscopy in 2018 was normal. A breath test for H. pylori failed to yield results. Symptoms of discomfort return when omeprazole  is discontinued.  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 She works in outpatient rehab as a Adult nurse and is flexible with her work locations.     MEDICAL HISTORY: Past Medical History:  Diagnosis Date   Chicken pox     Chronic kidney disease 4/09   Kidney stones   Leukopenia    Lymphoma (HCC)    Menopausal state 07/09/2013   GYN note 04/29/2015  Discussed risks/benefits/side effects and expectations with vaginal dryness.Discussed can be used in cream form, tablet or ring. Would recommend tablet due to low dose to start with. Questions addressed about using coconut oil also with use and discussed can be used without problems.Patient would like to use tablet form to try.  Rx Vagifem  with instructions to use one tablet at   Migraine    Mucosa-associated lymphoid tissue (MALT) lymphoma 04/21/2020   Multiple pulmonary nodules 03/19/2015   Primary osteoarthritis involving multiple joints 07/24/2014   Vitamin D  deficiency 03/29/2021   Well woman exam with routine gynecological exam 05/17/2023    ALLERGIES:  has no known allergies.  MEDICATIONS:  Current Outpatient Medications  Medication Sig Dispense Refill   Calcium Carbonate-Vitamin D  600-400 MG-UNIT tablet Take 1 tablet by mouth daily.     Estradiol  10 MCG TABS vaginal tablet Place 1 tablet (10 mcg total) vaginally 2 (two) times a week. 24 tablet 4   loratadine (CLARITIN) 5 MG chewable tablet Chew 5 mg by mouth daily.     Multiple Vitamins-Minerals (MULTIVITAMIN PO) Take 1 tablet by mouth daily.      Omega-3 Fatty Acids (FISH OIL PO) Take 1 tablet by mouth daily.      omeprazole  (PRILOSEC) 20 MG capsule Take 1 capsule (20 mg total) by mouth daily. 30 capsule 0   tamsulosin  (FLOMAX ) 0.4 MG CAPS capsule Take 1 capsule (0.4 mg total) by  mouth daily. (Patient not taking: Reported on 10/29/2023) 30 capsule 3   tiZANidine  (ZANAFLEX ) 4 MG tablet Take 1 tablet (4 mg total) by mouth every 6 (six) hours as needed for muscle spasms. (Patient not taking: Reported on 10/29/2023) 30 tablet 0   No current facility-administered medications for this visit.    SURGICAL HISTORY:  Past Surgical History:  Procedure Laterality Date   BRONCHIAL BIOPSY  11/28/2019    Procedure: BRONCHIAL BIOPSIES;  Surgeon: Brenna Adine CROME, DO;  Location: [facility];  Service: Pulmonary;;   BRONCHIAL BRUSHINGS  11/28/2019   Procedure: BRONCHIAL BRUSHINGS;  Surgeon: Brenna Adine CROME, DO;  Location: [facility];  Service: Pulmonary;;   BRONCHIAL NEEDLE ASPIRATION BIOPSY  11/28/2019   Procedure: BRONCHIAL NEEDLE ASPIRATION BIOPSIES;  Surgeon: Brenna Adine CROME, DO;  Location: [facility];  Service: Pulmonary;;   BRONCHIAL WASHINGS  11/28/2019   Procedure: BRONCHIAL WASHINGS;  Surgeon: Brenna Adine CROME, DO;  Location: [facility];  Service: Pulmonary;;   CESAREAN SECTION     x3   COLONOSCOPY     EXTRACORPOREAL SHOCK WAVE LITHOTRIPSY Right 04/19/2017   Procedure: RIGHT EXTRACORPOREAL SHOCK WAVE LITHOTRIPSY (ESWL);  Surgeon: Devere Lonni Righter, MD;  Location: [facility];  Service: Urology;  Laterality: Right;   EYE SURGERY  March 2006   Lasix   FIDUCIAL MARKER PLACEMENT  11/28/2019   Procedure: FIDUCIAL MARKER PLACEMENT;  Surgeon: Brenna Adine CROME, DO;  Location: [facility];  Service: Pulmonary;;   INTERCOSTAL NERVE BLOCK Right 03/11/2020   Procedure: INTERCOSTAL NERVE BLOCK;  Surgeon: Kerrin Elspeth BROCKS, MD;  Location: [facility] OR;  Service: Thoracic;  Laterality: Right;   LAPAROSCOPIC CHOLECYSTECTOMY  2001   LUMBAR LAMINECTOMY  01/2014   herniated disc   LYMPH NODE DISSECTION N/A 03/11/2020   Procedure: LYMPH NODE DISSECTION;  Surgeon: Kerrin Elspeth BROCKS, MD;  Location: [facility];  Service: Thoracic;  Laterality: N/A;   REFRACTIVE SURGERY Bilateral 2006   lasik   SPINE SURGERY     VIDEO BRONCHOSCOPY WITH ENDOBRONCHIAL NAVIGATION N/A 11/28/2019   Procedure: VIDEO BRONCHOSCOPY WITH ENDOBRONCHIAL NAVIGATION;  Surgeon: Brenna Adine CROME, DO;  Location: [facility];  Service: Pulmonary;  Laterality: N/A;   WISDOM TOOTH EXTRACTION     XI ROBOTIC ASSISTED THORASCOPY - RIGHT LOWER LOBE WEDGE RESECTION (Right)  03/11/2020   path pending. lung nodules    REVIEW OF  SYSTEMS:  Constitutional: negative Eyes: negative Ears, nose, mouth, throat, and face: negative Respiratory: negative Cardiovascular: negative Gastrointestinal: positive for reflux symptoms Genitourinary:negative Integument/breast: negative Hematologic/lymphatic: negative Musculoskeletal:negative Neurological: negative Behavioral/Psych: negative Endocrine: negative Allergic/Immunologic: negative   PHYSICAL EXAMINATION: General appearance: alert, cooperative, and no distress Head: Normocephalic, without obvious abnormality, atraumatic Neck: no adenopathy, no JVD, supple, symmetrical, trachea midline, and thyroid  not enlarged, symmetric, no tenderness/mass/nodules Lymph nodes: Cervical, supraclavicular, and axillary nodes normal. Resp: clear to auscultation bilaterally Back: symmetric, no curvature. ROM normal. No CVA tenderness. Cardio: regular rate and rhythm, S1, S2 normal, no murmur, click, rub or gallop GI: soft, non-tender; bowel sounds normal; no masses,  no organomegaly Extremities: extremities normal, atraumatic, no cyanosis or edema Neurologic: Alert and oriented X 3, normal strength and tone. Normal symmetric reflexes. Normal coordination and gait  ECOG PERFORMANCE STATUS: 0 - Asymptomatic  Blood pressure 118/76, pulse (!) 59, temperature (!) 95.3 F (35.2 C), resp. rate 17, height 5' 7 (1.702 m), weight 142 lb 11.2 oz (64.7 kg), last menstrual period 01/12/2014, SpO2 99%.  LABORATORY DATA: Lab Results  Component Value Date   WBC  3.2 (L) 10/03/2023   HGB 14.1 10/03/2023   HCT 42.8 10/03/2023   MCV 91 10/03/2023   PLT 171 10/03/2023      Chemistry      Component Value Date/Time   NA 143 10/03/2023 1204   K 4.4 10/03/2023 1204   CL 101 10/03/2023 1204   CO2 24 10/03/2023 1204   BUN 18 10/03/2023 1204   CREATININE 0.99 10/03/2023 1204   CREATININE 0.90 10/18/2022 0843   CREATININE 0.85 02/21/2021 1355      Component Value Date/Time   CALCIUM 9.7 10/03/2023  1204   ALKPHOS 53 10/03/2023 1204   AST 24 10/03/2023 1204   AST 23 10/18/2022 0843   ALT 23 10/03/2023 1204   ALT 22 10/18/2022 0843   BILITOT 0.5 10/03/2023 1204   BILITOT 0.7 10/18/2022 0843       RADIOGRAPHIC STUDIES: CT ABDOMEN PELVIS WO CONTRAST Result Date: 10/03/2023 CLINICAL DATA:  Epigastric pain. EXAM: CT ABDOMEN AND PELVIS WITHOUT CONTRAST TECHNIQUE: Multidetector CT imaging of the abdomen and pelvis was performed following the standard protocol without IV contrast. RADIATION DOSE REDUCTION: This exam was performed according to the departmental dose-optimization program which includes automated exposure control, adjustment of the mA and/or kV according to patient size and/or use of iterative reconstruction technique. COMPARISON:  CT abdomen pelvis dated 05/15/2008. FINDINGS: Evaluation of this exam is limited in the absence of intravenous contrast. Lower chest: Postsurgical changes of the right lung base. A 6 mm nodule at the left lung base and a 4 mm faint ground-glass nodule in the right middle lobe. The left lung base nodule appears new since the prior chest CT of 03/03/2020. No intra-abdominal free air.  Small free fluid in the pelvis. Hepatobiliary: The liver is unremarkable. No biliary dilatation. Cholecystectomy. Pancreas: Unremarkable. No pancreatic ductal dilatation or surrounding inflammatory changes. Spleen: Normal in size without focal abnormality. Adrenals/Urinary Tract: The adrenal glands are unremarkable. Multiple nonobstructing bilateral renal calculi. The largest stone or 2 adjacent stones in the inferior pole of the left kidney measure up to 11 mm in combined length. There is no hydronephrosis on the left. There is mild right hydronephrosis with transition at the ureteropelvic junction. No obstructing stone. Underlying stricture or urothelial lesion is not excluded. The urinary bladder is unremarkable. Stomach/Bowel: There is no bowel obstruction or active inflammation. The  appendix is normal. Vascular/Lymphatic: The abdominal aorta and IVC are unremarkable on this noncontrast CT. No portal venous gas. There is no adenopathy. Reproductive: The uterus is anteverted. Small fundal fibroid. No suspicious adnexal masses. Other: None Musculoskeletal: No acute osseous pathology. IMPRESSION: 1. Mild right hydronephrosis with transition at the ureteropelvic junction. No obstructing stone. Underlying stricture or urothelial lesion is not excluded. 2. Multiple nonobstructing bilateral renal calculi. 3. No bowel obstruction. Normal appendix. 4. A 6 mm nodule at the left lung base, new since the chest CT of 2022. Electronically Signed   By: Vanetta Chou M.D.   On: 10/03/2023 15:22    ASSESSMENT AND PLAN: This is a very pleasant 58 years old white female with extranodal marginal zone lymphoma of mucosa associated lymphoid tissue (MALT lymphoma) status post multiple wedge resection of the right lower lobe on March 11, 2020 under the care of Dr. Kerrin. The patient has been on observation since that time and she is feeling fine with no concerning complaints. She had CT of the abdomen pelvis performed recently that showed new 6 mm nodule of the left lung base with new compared to  previous scan.  She also has some other nodules in the right lung. She had a chest x-ray performed earlier today but the result is still pending. I had a lengthy discussion with the patient today about her current condition. Assessment and Plan Assessment & Plan Pulmonary nodule, right lower lobe, status post wedge resection, under surveillance A small nodule in the right lower lobe was identified on a recent CT of the abdomen and pelvis, in the same location as previous nodules removed. The nodule is difficult to visualize on chest x-ray, necessitating further evaluation to ensure no new developments, given the history of wedge resection. - Order CT of the chest without contrast to evaluate the  pulmonary nodule - If the CT is normal, continue with surveillance using chest x-ray - If the CT shows concerning findings, schedule an earlier follow-up  Gastroesophageal reflux symptoms responsive to omeprazole  Gastroesophageal reflux symptoms are responsive to omeprazole . There is concern about potential MALT lymphoma in the stomach which could be similar to the lung but it was a different causative factor. She is advised to monitor symptoms and consider further evaluation if symptoms persist. - Monitor symptoms and consult gastroenterologist if symptoms persist - Avoid spicy foods and other irritants - Consider further evaluation if symptoms of acid reflux continue She was advised to call immediately if she has any concerning symptoms in the interval.  The patient voices understanding of current disease status and treatment options and is in agreement with the current care plan.  All questions were answered. The patient knows to call the clinic with any problems, questions or concerns. We can certainly see the patient much sooner if necessary.  The total time spent in the appointment was 30 minutes.  Disclaimer: This note was dictated with voice recognition software. Similar sounding words can inadvertently be transcribed and may not be corrected upon review.

## 2023-11-06 ENCOUNTER — Ambulatory Visit

## 2023-11-06 DIAGNOSIS — C884 Extranodal marginal zone b-cell lymphoma of mucosa-associated lymphoid tissue (malt-lymphoma) not having achieved remission: Secondary | ICD-10-CM

## 2023-11-12 ENCOUNTER — Other Ambulatory Visit: Payer: Self-pay | Admitting: Internal Medicine

## 2023-11-12 ENCOUNTER — Ambulatory Visit: Payer: Self-pay | Admitting: *Deleted

## 2023-11-12 DIAGNOSIS — C884 Extranodal marginal zone b-cell lymphoma of mucosa-associated lymphoid tissue (malt-lymphoma) not having achieved remission: Secondary | ICD-10-CM

## 2024-02-14 ENCOUNTER — Telehealth: Payer: Self-pay | Admitting: Internal Medicine

## 2024-02-14 NOTE — Telephone Encounter (Signed)
 I spoke with patient to return her voicemail requesting to change lab appointment from 05/05/2024 to 05/02/2024.

## 2024-02-29 ENCOUNTER — Encounter: Payer: Self-pay | Admitting: Family Medicine

## 2024-02-29 ENCOUNTER — Ambulatory Visit (INDEPENDENT_AMBULATORY_CARE_PROVIDER_SITE_OTHER): Payer: 59 | Admitting: Family Medicine

## 2024-02-29 ENCOUNTER — Ambulatory Visit: Payer: Self-pay | Admitting: Family Medicine

## 2024-02-29 VITALS — BP 112/72 | HR 56 | Temp 97.9°F | Ht 67.0 in | Wt 140.2 lb

## 2024-02-29 DIAGNOSIS — Z0000 Encounter for general adult medical examination without abnormal findings: Secondary | ICD-10-CM | POA: Diagnosis not present

## 2024-02-29 DIAGNOSIS — Z1211 Encounter for screening for malignant neoplasm of colon: Secondary | ICD-10-CM | POA: Diagnosis not present

## 2024-02-29 DIAGNOSIS — E559 Vitamin D deficiency, unspecified: Secondary | ICD-10-CM

## 2024-02-29 DIAGNOSIS — Z13220 Encounter for screening for lipoid disorders: Secondary | ICD-10-CM | POA: Diagnosis not present

## 2024-02-29 DIAGNOSIS — Z131 Encounter for screening for diabetes mellitus: Secondary | ICD-10-CM

## 2024-02-29 DIAGNOSIS — R918 Other nonspecific abnormal finding of lung field: Secondary | ICD-10-CM | POA: Diagnosis not present

## 2024-02-29 DIAGNOSIS — E2839 Other primary ovarian failure: Secondary | ICD-10-CM

## 2024-02-29 DIAGNOSIS — C884 Extranodal marginal zone b-cell lymphoma of mucosa-associated lymphoid tissue (malt-lymphoma) not having achieved remission: Secondary | ICD-10-CM

## 2024-02-29 DIAGNOSIS — N951 Menopausal and female climacteric states: Secondary | ICD-10-CM | POA: Diagnosis not present

## 2024-02-29 DIAGNOSIS — Z8249 Family history of ischemic heart disease and other diseases of the circulatory system: Secondary | ICD-10-CM

## 2024-02-29 DIAGNOSIS — K219 Gastro-esophageal reflux disease without esophagitis: Secondary | ICD-10-CM | POA: Insufficient documentation

## 2024-02-29 DIAGNOSIS — Z1231 Encounter for screening mammogram for malignant neoplasm of breast: Secondary | ICD-10-CM

## 2024-02-29 LAB — COMPREHENSIVE METABOLIC PANEL WITH GFR
ALT: 26 U/L (ref 3–35)
AST: 28 U/L (ref 5–37)
Albumin: 4.6 g/dL (ref 3.5–5.2)
Alkaline Phosphatase: 45 U/L (ref 39–117)
BUN: 14 mg/dL (ref 6–23)
CO2: 33 meq/L — ABNORMAL HIGH (ref 19–32)
Calcium: 9.7 mg/dL (ref 8.4–10.5)
Chloride: 98 meq/L (ref 96–112)
Creatinine, Ser: 1.05 mg/dL (ref 0.40–1.20)
GFR: 58.71 mL/min — ABNORMAL LOW
Glucose, Bld: 84 mg/dL (ref 70–99)
Potassium: 4 meq/L (ref 3.5–5.1)
Sodium: 139 meq/L (ref 135–145)
Total Bilirubin: 0.6 mg/dL (ref 0.2–1.2)
Total Protein: 6.8 g/dL (ref 6.0–8.3)

## 2024-02-29 LAB — LIPID PANEL
Cholesterol: 221 mg/dL — ABNORMAL HIGH (ref 28–200)
HDL: 93 mg/dL
LDL Cholesterol: 117 mg/dL — ABNORMAL HIGH (ref 10–99)
NonHDL: 128.24
Total CHOL/HDL Ratio: 2
Triglycerides: 57 mg/dL (ref 10.0–149.0)
VLDL: 11.4 mg/dL (ref 0.0–40.0)

## 2024-02-29 LAB — CBC
HCT: 41.7 % (ref 36.0–46.0)
Hemoglobin: 14 g/dL (ref 12.0–15.0)
MCHC: 33.5 g/dL (ref 30.0–36.0)
MCV: 87.9 fl (ref 78.0–100.0)
Platelets: 169 10*3/uL (ref 150.0–400.0)
RBC: 4.75 Mil/uL (ref 3.87–5.11)
RDW: 13.5 % (ref 11.5–15.5)
WBC: 2.7 10*3/uL — ABNORMAL LOW (ref 4.0–10.5)

## 2024-02-29 LAB — TSH: TSH: 1.5 u[IU]/mL (ref 0.35–5.50)

## 2024-02-29 LAB — HEMOGLOBIN A1C: Hgb A1c MFr Bld: 5.3 % (ref 4.6–6.5)

## 2024-02-29 NOTE — Patient Instructions (Addendum)

## 2024-02-29 NOTE — Progress Notes (Signed)
 "    Patient ID: Laura Ritter, female  DOB: 1965/02/28, 59 y.o.   MRN: [id number] Patient Care Team    Relationship Specialty Notifications Start End  Catherine Charlies LABOR, DO PCP - General Family Medicine  02/21/21   Mannam, Praveen, MD Consulting Physician Pulmonary Disease  01/14/16    Comment: nodules  Kristie Lamprey, MD Consulting Physician Gastroenterology  12/12/16   Brenna Adine CROME, DO Consulting Physician Pulmonary Disease  03/24/20   Kerrin Elspeth BROCKS, MD Consulting Physician Cardiothoracic Surgery  03/24/20   Sherrod Sherrod, MD Consulting Physician Oncology  02/21/21   Rudy Greig GAILS, OD  Optometry  02/21/21   Dallie Vera GAILS, MD Consulting Physician Obstetrics and Gynecology  02/29/24   Roseann Adine JINNY., MD Referring Physician Urology  02/29/24   Madireddy, Alean SAUNDERS, MD Consulting Physician Cardiology  02/29/24     Chief Complaint  Patient presents with   Annual Exam    Pt is fasting,     Subjective:  Laura Ritter is a 59 y.o.  female present for  CPE All past medical history, surgical history, allergies, family history, immunizations, medications and social history were updated in the electronic medical record today. All recent labs, ED visits and hospitalizations within the last year were reviewed.  Health maintenance:  Colonoscopy: No fhx. completed 12/27/2016, by Dr. Kristie- 10 yr follow up Mammogram: No fhx.  05/24/2023. completed:gyn Cervical cancer screening: last pap: 05/17/2023, completed by: gyn, 5-year  Immunizations: tdap UTD 2018, Influenza UTD 2025 (encouraged yearly),  shingrix completed, PNA20 completed Infectious disease screening: HIV and Hep C completed DEXA: routine screen at 60> ordered [facility] Assistive device: none Oxygen use: none Patient has a Dental home. Hospitalizations/ED visits: Reviewed  CT 11/06/2023 IMPRESSION: 1. Right lower lobe wedge resection. 2. Multiple bilateral pulmonary nodules, some of which are new or enlarged, as  above. These are modestly suspicious for multifocal adenocarcinoma and warrant attention on follow-up. 3. No evidence of lymphadenopathy or metastatic disease in the abdomen or pelvis.    02/29/2024    8:04 AM 10/29/2023    3:42 PM 10/03/2023   11:38 AM 05/17/2023   11:33 AM 09/18/2022   10:42 AM  Depression screen PHQ 2/9  Decreased Interest 0 0 0 0 0  Down, Depressed, Hopeless 0 0 0 0 0  PHQ - 2 Score 0 0 0 0 0  Altered sleeping 0      Tired, decreased energy 0      Change in appetite 0      Feeling bad or failure about yourself  0      Trouble concentrating 0      Moving slowly or fidgety/restless 0      Suicidal thoughts 0      PHQ-9 Score 0      Difficult doing work/chores Not difficult at all          02/29/2024    8:04 AM  GAD 7 : Generalized Anxiety Score  Nervous, Anxious, on Edge 0  Control/stop worrying 0  Worry too much - different things 0  Trouble relaxing 0  Restless 0  Easily annoyed or irritable 0  Afraid - awful might happen 0  Total GAD 7 Score 0  Anxiety Difficulty Not difficult at all          02/29/2024    8:04 AM 05/17/2023   11:33 AM  Fall Risk   Falls in the past year? 0 0  Number falls in past yr:  0 0  Injury with Fall? 0 0   Risk for fall due to : No Fall Risks No Fall Risks  Follow up Falls evaluation completed Falls evaluation completed     Data saved with a previous flowsheet row definition    Immunization History  Administered Date(s) Administered   Influenza, Seasonal, Injecte, Preservative Fre 10/25/2022, 10/09/2023   Influenza,inj,Quad PF,6+ Mos 10/31/2014, 11/01/2015, 10/27/2016   Influenza-Unspecified 11/19/2017, 11/13/2018, 11/13/2019, 10/25/2020, 10/30/2021   PFIZER Comirnaty(Gray Top)Covid-19 Tri-Sucrose Vaccine 05/18/2020   PFIZER(Purple Top)SARS-COV-2 Vaccination 11/17/2019   PNEUMOCOCCAL CONJUGATE-20 02/28/2023   Tdap 01/30/2005, 04/05/2016   Unspecified SARS-COV-2 Vaccination 11/03/2021   Zoster Recombinant(Shingrix)  01/06/2021, 03/25/2021    No results found.  Past Medical History:  Diagnosis Date   Blood type A+ 03/11/2020   A POS Performed at [facility], [address], [place], [place] [address]   Chicken pox    Chronic kidney disease 4/09   Kidney stones   GERD (gastroesophageal reflux disease) 02/29/2024   Leukopenia    Menopausal state 07/09/2013   GYN note 04/29/2015  Discussed risks/benefits/side effects and expectations with vaginal dryness.Discussed can be used in cream form, tablet or ring. Would recommend tablet due to low dose to start with. Questions addressed about using coconut oil also with use and discussed can be used without problems.Patient would like to use tablet form to try.  Rx Vagifem  with instructions to use one tablet at   Migraine    Mucosa-associated lymphoid tissue (MALT) lymphoma (HCC) 04/21/2020   Multiple pulmonary nodules 03/19/2015   Nephrolithiasis 10/12/2023   Palpitations 03/01/2015   Primary osteoarthritis involving multiple joints 07/24/2014   Vitamin D  deficiency 03/29/2021   No Active Allergies Past Surgical History:  Procedure Laterality Date   BRONCHIAL BIOPSY  11/28/2019   Procedure: BRONCHIAL BIOPSIES;  Surgeon: Brenna Adine CROME, DO;  Location: [facility];  Service: Pulmonary;;   BRONCHIAL BRUSHINGS  11/28/2019   Procedure: BRONCHIAL BRUSHINGS;  Surgeon: Brenna Adine CROME, DO;  Location: [facility];  Service: Pulmonary;;   BRONCHIAL NEEDLE ASPIRATION BIOPSY  11/28/2019   Procedure: BRONCHIAL NEEDLE ASPIRATION BIOPSIES;  Surgeon: Brenna Adine CROME, DO;  Location: [facility];  Service: Pulmonary;;   BRONCHIAL WASHINGS  11/28/2019   Procedure: BRONCHIAL WASHINGS;  Surgeon: Brenna Adine CROME, DO;  Location: [facility];  Service: Pulmonary;;   CESAREAN SECTION     x3   COLONOSCOPY     EXTRACORPOREAL SHOCK WAVE LITHOTRIPSY Right 04/19/2017   Procedure: RIGHT EXTRACORPOREAL SHOCK WAVE LITHOTRIPSY (ESWL);  Surgeon: Devere Lonni Righter, MD;  Location: [facility];  Service: Urology;  Laterality: Right;   EYE SURGERY  March 2006   Lasix   FIDUCIAL MARKER PLACEMENT  11/28/2019   Procedure: FIDUCIAL MARKER PLACEMENT;  Surgeon: Brenna Adine CROME, DO;  Location: [facility];  Service: Pulmonary;;   INTERCOSTAL NERVE BLOCK Right 03/11/2020   Procedure: INTERCOSTAL NERVE BLOCK;  Surgeon: Kerrin Elspeth BROCKS, MD;  Location[facility];  Service: Thoracic;  Laterality: Right;   LAPAROSCOPIC CHOLECYSTECTOMY  2001   LUMBAR LAMINECTOMY  01/2014   herniated disc   LYMPH NODE DISSECTION N/A 03/11/2020   Procedure: LYMPH NODE DISSECTION;  Surgeon: Kerrin Elspeth BROCKS, MD;  Location: [facility] OR;  Service: Thoracic;  Laterality: N/A;   REFRACTIVE SURGERY Bilateral 2006   lasik   SPINE SURGERY     VIDEO BRONCHOSCOPY WITH ENDOBRONCHIAL NAVIGATION N/A 11/28/2019   Procedure: VIDEO BRONCHOSCOPY WITH ENDOBRONCHIAL NAVIGATION;  Surgeon: Brenna Adine CROME, DO;  Location: [facility];  Service: Pulmonary;  Laterality: N/A;   WISDOM TOOTH EXTRACTION     XI ROBOTIC ASSISTED THORASCOPY - RIGHT LOWER LOBE WEDGE RESECTION (Right)  03/11/2020   path pending. lung nodules   Family History  Problem Relation Age of Onset   Hyperlipidemia Mother    Alcohol abuse Mother    Arthritis Mother    Alzheimer's disease Mother    Hypertension Father    Parkinson's disease Father    Alcohol abuse Father    Kidney Stones Brother    Alcohol abuse Brother    Dementia Maternal Grandmother    Heart disease Paternal Grandfather    Parkinson's disease Paternal Grandfather    Social History   Social History Narrative   Marital status/children/pets: Married G3P3   Education/employment: Masters degree.  Works as a [employer]:      -smoke alarm in the home:Yes     - wears seatbelt: Yes     - Feels safe in their relationships: Yes       Allergies as of 02/29/2024   No Active Allergies      Medication List        Accurate as of February 29, 2024  8:27 AM. If you have any questions, ask your nurse or doctor.          STOP taking these medications    loratadine 5 MG chewable tablet Commonly known as: CLARITIN Stopped by: Charlies Bellini, DO   omeprazole  20 MG capsule Commonly known as: PRILOSEC Stopped by: Charlies Bellini, DO   tamsulosin  0.4 MG Caps capsule Commonly known as: FLOMAX  Stopped by: Charlies Bellini, DO   tiZANidine  4 MG tablet Commonly known as: ZANAFLEX  Stopped by: Charlies Bellini, DO       TAKE these medications    Calcium Carbonate-Vitamin D  600-400 MG-UNIT tablet Take 1 tablet by mouth daily.   Estradiol  10 MCG Tabs vaginal tablet Place 1 tablet (10 mcg total) vaginally 2 (two) times a week.   FISH OIL PO Take 1 tablet by mouth daily.   MULTIVITAMIN PO Take 1 tablet by mouth daily.        All past medical history, surgical history, allergies, family history, immunizations andmedications were updated in the EMR today and reviewed under the history and medication portions of their EMR.    No results found for this or any previous visit (from the past 2160 hours).   Review of Systems  Constitutional: Negative.   HENT: Negative.    Eyes: Negative.   Respiratory: Negative.    Cardiovascular: Negative.   Gastrointestinal: Negative.   Genitourinary: Negative.   Musculoskeletal: Negative.   Skin: Negative.   Neurological: Negative.   Endo/Heme/Allergies: Negative.   Psychiatric/Behavioral: Negative.    All other systems reviewed and are negative.  14 pt review of systems performed and negative (unless mentioned in an HPI)  Objective: BP 112/72   Pulse (!) 56   Temp 97.9 F (36.6 C)   Ht 5' 7 (1.702 m)   Wt 140 lb 3.2 oz (63.6 kg)   LMP 01/12/2014   SpO2 99%   BMI 21.96 kg/m  Physical Exam Vitals and nursing note reviewed.  Constitutional:      General: She is not in acute distress.    Appearance: Normal appearance. She is not ill-appearing or toxic-appearing.  HENT:      Head: Normocephalic and atraumatic.     Right Ear: Tympanic membrane, ear canal and external ear normal. There is  no impacted cerumen.     Left Ear: Tympanic membrane, ear canal and external ear normal. There is no impacted cerumen.     Nose: No congestion or rhinorrhea.     Mouth/Throat:     Mouth: Mucous membranes are moist.     Pharynx: Oropharynx is clear. No oropharyngeal exudate or posterior oropharyngeal erythema.  Eyes:     General: No scleral icterus.       Right eye: No discharge.        Left eye: No discharge.     Extraocular Movements: Extraocular movements intact.     Conjunctiva/sclera: Conjunctivae normal.     Pupils: Pupils are equal, round, and reactive to light.  Cardiovascular:     Rate and Rhythm: Normal rate and regular rhythm.     Pulses: Normal pulses.     Heart sounds: Normal heart sounds. No murmur heard.    No friction rub. No gallop.  Pulmonary:     Effort: Pulmonary effort is normal. No respiratory distress.     Breath sounds: Normal breath sounds. No stridor. No wheezing, rhonchi or rales.  Chest:     Chest wall: No tenderness.  Abdominal:     General: Abdomen is flat. Bowel sounds are normal. There is no distension.     Palpations: Abdomen is soft. There is no mass.     Tenderness: There is no abdominal tenderness. There is no right CVA tenderness, left CVA tenderness, guarding or rebound.     Hernia: No hernia is present.  Musculoskeletal:        General: No swelling, tenderness or deformity. Normal range of motion.     Cervical back: Normal range of motion and neck supple. No rigidity or tenderness.     Right lower leg: No edema.     Left lower leg: No edema.  Lymphadenopathy:     Cervical: No cervical adenopathy.  Skin:    General: Skin is warm and dry.     Coloration: Skin is not jaundiced or pale.     Findings: No bruising, erythema, lesion or rash.  Neurological:     General: No focal deficit present.     Mental Status: She is alert and  oriented to person, place, and time. Mental status is at baseline.     Cranial Nerves: No cranial nerve deficit.     Sensory: No sensory deficit.     Motor: No weakness.     Coordination: Coordination normal.     Gait: Gait normal.     Deep Tendon Reflexes: Reflexes normal.  Psychiatric:        Mood and Affect: Mood normal.        Behavior: Behavior normal.        Thought Content: Thought content normal.        Judgment: Judgment normal.      Assessment/plan: Laura Ritter is a 59 y.o. female present for cpe Routine general medical examination at a health care facility Patient was encouraged to exercise greater than 150 minutes a week. Patient was encouraged to choose a diet filled with fresh fruits and vegetables, and lean meats. AVS provided to patient today for education/recommendation on gender specific health and safety maintenance. Colonoscopy: No fhx. completed 12/27/2016, by Dr. Kristie- 10 yr follow up Mammogram: No fhx.  05/24/2023. completed:gyn Cervical cancer screening: last pap: 05/17/2023, completed by: gyn, 5-year  Immunizations: tdap UTD 2018, Influenza UTD 2025 (encouraged yearly),  shingrix completed, PNA20 completed Infectious disease screening: HIV  and Hep C completed DEXA: routine screen at 60> ordered [facility]  Colon cancer screening UTD- due 12/2026 Lipid screening - Lipid panel Diabetes mellitus screening - Hemoglobin A1c Vitamin D  deficiency/Menopausal state/Estrogen deficiency - DG Bone Density; Future - supplementing 1000u daily FH: heart disease Pt desired screening - CT CARDIAC SCORING (SELF PAY ONLY); Future  MALT lymphoma/Multiple bilateral pulmonary nodules (some new and some enlarged concerning for adenocarcinoma 10/2023) - 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  GERD/long-term proton pump inhibitor use Gastroesophageal reflux symptoms responsive to omeprazole  OTC PRN Pt reports she starting taking her vitamins early in the day, instead of before bed, and symptoms resolved.  -There is concern about potential MALT lymphoma in the stomach, if sx persisted - Monitor symptoms and consult gastroenterologist if symptoms persist - Avoid spicy foods and other irritants  Return in about 1 year (around 03/01/2025) for cpe (20 min).  Orders Placed This Encounter  Procedures   DG Bone Density   CT CARDIAC SCORING (SELF PAY ONLY)   CBC   Comprehensive metabolic panel with GFR   Hemoglobin A1c   Lipid panel   TSH   No orders of the defined types were placed in this encounter.  Referral Orders  No referral(s) requested today     Note is dictated utilizing voice recognition software. Although note has been proof read prior to signing, occasional typographical errors still can be missed. If any questions arise, please do not hesitate to call for verification.  Electronically signed by: Charlies Bellini, DO [facility] "

## 2024-05-02 ENCOUNTER — Inpatient Hospital Stay

## 2024-05-05 ENCOUNTER — Inpatient Hospital Stay

## 2024-05-12 ENCOUNTER — Inpatient Hospital Stay: Admitting: Internal Medicine

## 2024-05-13 ENCOUNTER — Inpatient Hospital Stay: Admitting: Internal Medicine

## 2024-05-21 ENCOUNTER — Ambulatory Visit: Admitting: Obstetrics and Gynecology

## 2025-03-06 ENCOUNTER — Encounter: Admitting: Family Medicine
